# Patient Record
Sex: Female | Born: 1990 | Race: White | Hispanic: No | Marital: Single
Health system: Midwestern US, Community
[De-identification: ages and names within clinical notes are randomized; demographics above are authoritative.]

## PROBLEM LIST (undated history)

## (undated) ENCOUNTER — Inpatient Hospital Stay (HOSPITAL_COMMUNITY): Payer: Self-pay

## (undated) DIAGNOSIS — E109 Type 1 diabetes mellitus without complications: Secondary | ICD-10-CM

## (undated) DIAGNOSIS — O039 Complete or unspecified spontaneous abortion without complication: Secondary | ICD-10-CM

## (undated) DIAGNOSIS — Z30017 Encounter for initial prescription of implantable subdermal contraceptive: Secondary | ICD-10-CM

## (undated) DIAGNOSIS — S0500XA Injury of conjunctiva and corneal abrasion without foreign body, unspecified eye, initial encounter: Secondary | ICD-10-CM

## (undated) DIAGNOSIS — N390 Urinary tract infection, site not specified: Secondary | ICD-10-CM

## (undated) DIAGNOSIS — Z3490 Encounter for supervision of normal pregnancy, unspecified, unspecified trimester: Secondary | ICD-10-CM

## (undated) DIAGNOSIS — Z794 Long term (current) use of insulin: Secondary | ICD-10-CM

## (undated) DIAGNOSIS — IMO0001 Reserved for inherently not codable concepts without codable children: Secondary | ICD-10-CM

## (undated) DIAGNOSIS — O139 Gestational [pregnancy-induced] hypertension without significant proteinuria, unspecified trimester: Secondary | ICD-10-CM

## (undated) DIAGNOSIS — F319 Bipolar disorder, unspecified: Secondary | ICD-10-CM

## (undated) DIAGNOSIS — E119 Type 2 diabetes mellitus without complications: Secondary | ICD-10-CM

## (undated) HISTORY — DX: Type 2 diabetes mellitus without complications: E11.9

## (undated) HISTORY — DX: Type 1 diabetes mellitus without complications: E10.9

## (undated) HISTORY — DX: Complete or unspecified spontaneous abortion without complication: O03.9

## (undated) HISTORY — DX: Reserved for inherently not codable concepts without codable children: IMO0001

## (undated) HISTORY — DX: Injury of conjunctiva and corneal abrasion without foreign body, unspecified eye, initial encounter: S05.00XA

## (undated) HISTORY — DX: Encounter for initial prescription of implantable subdermal contraceptive: Z30.017

## (undated) HISTORY — DX: Long term (current) use of insulin: Z79.4

---
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
## 2012-01-18 LAB — URINE MICROSCOPIC ONLY
RBC: 36 /HPF (ref 0–5)
RBC: 4 /HPF (ref 0–5)
WBC: 4 /HPF (ref 0–4)
WBC: 4 /HPF (ref 0–4)

## 2012-01-18 LAB — URINALYSIS W/ RFLX MICROSCOPIC
Bilirubin: NEGATIVE
Bilirubin: NEGATIVE
Glucose: NEGATIVE MG/DL
Glucose: NEGATIVE MG/DL
Ketone: NEGATIVE MG/DL
Ketone: NEGATIVE MG/DL
Nitrites: NEGATIVE
Nitrites: NEGATIVE
Protein: 30 MG/DL — AB
Protein: NEGATIVE MG/DL
Specific gravity: 1.02 (ref 1.003–1.030)
Specific gravity: 1.01 (ref 1.003–1.030)
Urobilinogen: 0.2 EU/DL (ref 0.2–1.0)
Urobilinogen: 0.2 EU/DL (ref 0.2–1.0)
pH (UA): 7 (ref 5.0–8.0)
pH (UA): 7 (ref 5.0–8.0)

## 2012-01-18 LAB — WET PREP

## 2012-01-18 LAB — HCG URINE, QL: HCG urine, QL: NEGATIVE

## 2012-01-18 MED ORDER — ONDANSETRON 8 MG TAB, RAPID DISSOLVE
8 mg | ORAL | Status: AC
Start: 2012-01-18 — End: 2012-01-18
  Administered 2012-01-18: 21:00:00 via ORAL

## 2012-01-18 MED ORDER — IBUPROFEN 400 MG TAB
400 mg | ORAL_TABLET | Freq: Three times a day (TID) | ORAL | Status: DC | PRN
Start: 2012-01-18 — End: 2015-05-04

## 2012-01-18 MED ORDER — AZITHROMYCIN 250 MG TAB
250 mg | ORAL | Status: AC
Start: 2012-01-18 — End: 2012-01-18
  Administered 2012-01-18: 21:00:00 via ORAL

## 2012-01-18 MED ORDER — CEFTRIAXONE 250 MG SOLUTION FOR INJECTION
250 mg | Freq: Once | INTRAMUSCULAR | Status: AC
Start: 2012-01-18 — End: 2012-01-18
  Administered 2012-01-18: 21:00:00 via INTRAMUSCULAR

## 2012-01-18 MED ORDER — ONDANSETRON HCL 4 MG TAB
4 mg | ORAL_TABLET | Freq: Three times a day (TID) | ORAL | Status: DC | PRN
Start: 2012-01-18 — End: 2015-05-04

## 2012-01-18 MED ORDER — CIPROFLOXACIN 500 MG TAB
500 mg | ORAL_TABLET | Freq: Two times a day (BID) | ORAL | Status: AC
Start: 2012-01-18 — End: 2012-01-25

## 2012-01-18 MED ORDER — METRONIDAZOLE 500 MG TAB
500 mg | ORAL_TABLET | Freq: Two times a day (BID) | ORAL | Status: AC
Start: 2012-01-18 — End: 2012-01-25

## 2012-01-18 MED FILL — AZITHROMYCIN 250 MG TAB: 250 mg | ORAL | Qty: 4

## 2012-01-18 MED FILL — ONDANSETRON 8 MG TAB, RAPID DISSOLVE: 8 mg | ORAL | Qty: 1

## 2012-01-18 MED FILL — XYLOCAINE-MPF 10 MG/ML (1 %) INJECTION SOLUTION: 10 mg/mL (1 %) | INTRAMUSCULAR | Qty: 5

## 2012-01-18 MED FILL — CEFTRIAXONE 250 MG SOLUTION FOR INJECTION: 250 mg | INTRAMUSCULAR | Qty: 250

## 2012-01-18 NOTE — ED Provider Notes (Signed)
Patient is a 21 y.o. female presenting with abdominal pain, vomiting, and dysmenorrhea. The history is provided by the patient.   Abdominal Pain   Associated symptoms include vomiting.   Vomiting   Associated symptoms include abdominal pain.   Dysmenorrhea  Associated symptoms include abdominal pain and vomiting.    usually healthy 21 yo with PMH irregular menses, no past pregnancies or GYN infections, reports onset menses 2 days ago, now with cramps pelvic pain and heavier bleeding than typical for her.Does not normally have any pain with periods. Used 4 tampons today. Also reports dysuria. No discharge except bleeding. Using condoms most of the time. No recent partner change. Is on BCP, is usual time for menses. Today nausea and vomited twice. No fever, no bowel changes.     History reviewed. No pertinent past medical history.     History reviewed. No pertinent past surgical history.      No family history on file.     History     Social History   ??? Marital Status: SINGLE     Spouse Name: N/A     Number of Children: N/A   ??? Years of Education: N/A     Occupational History   ??? Not on file.     Social History Main Topics   ??? Smoking status: Never Smoker    ??? Smokeless tobacco: Not on file   ??? Alcohol Use: No   ??? Drug Use: No   ??? Sexually Active:      Other Topics Concern   ??? Not on file     Social History Narrative   ??? No narrative on file                  ALLERGIES: Aspirin      Review of Systems   Gastrointestinal: Positive for vomiting and abdominal pain.   Constitutional:  Denies malaise, fever, chills.   Head:  Denies injury.   Face:  Denies injury or pain.   ENMT:  Denies sore throat.   Neck:  Denies injury or pain.   Chest:  Denies injury.   Cardiac:  Denies chest pain or palpitations.   Respiratory:  Denies cough, wheezing, difficulty breathing, shortness of breath.   GI/ABD:  Denies injury,+ pelvic pain, -distention,+ nausea, vomiting, -diarrhea.   GU:  Denies injury, +pain, dysuria or urgency.   Back:   Denies injury or pain.   Pelvis:  Denies injury or pain.   Extremity/MS:  Denies injury or pain.   Neuro:  Denies headache, LOC, dizziness, neurologic symptoms/deficits/paresthesias.   Skin: Denies injury, rash, itching or skin changes.    Filed Vitals:    01/18/12 1515   BP: 140/82   Pulse: 88   Temp: 99 ??F (37.2 ??C)   Resp: 18   Height: 5\' 11"  (1.803 m)   Weight: 62.199 kg (137 lb 2 oz)   SpO2: 100%            Physical Exam   Constitutional : awake, alert, in no distress, well appearing  Head:  Normocephalic, atraumatic.  Eyes:  Pupils are equal, round and reactive to light.  The extraocular muscles are intact.  Eyelids, conjunctiva, iris, and sclera are normal.    Ears:  External ears are normal.  Nose:  The nose is normal in appearance.  There is no rhinorrhea.  Mouth/dental:  The lips, gums, and teeth appear normal.  There are no exudates or erosions on the buccal mucosa.  The  uvula is mid-line.  The tonsils are not inflamed or erythematous.  The posterior pharynx is free from erythema and exudates.    Neck:  The trachea is mid-line.  The neck is supple and non-tender to palpation.  There is no cervical lymphadenopathy.  There is no JVD.  Chest:  No evidence of trauma or deformity.  Non-tender to palpation.  No crepitus or paradoxical movements.  Chest excursion is normal.  Cardiovascular:  The heart has a regular rate and rhythm.  S1 and S2 are normal.  There are no murmurs, rubs, or gallops.  Pulses and pressures are equal bilaterally and there is brisk capillary refill.  Lungs:  Respiratory rate and effort are normal.  The lungs are clear to auscultation and percussion bilaterally.  No wheezes, rhonchi, or rales.  GI/Abdomen:  The abdomen is normal in appearance.  Bowel sounds are normal and are heard in all four quadrants.  There are no abdominal bruits.  There is no pain with palpation.  There is no evidence of spleen or liver enlargement.  No masses were palpated.    GU: normal female genitalia, scant  blood in vault, no CMT, + bilateral adnexal tenderness  Musculoskeletal:  There are no deformities noted in all four extremities.  There is full ROM with movement.    Integumentary:  The skin appears normal for age and race.  It is warm and dry.  There is no rash.  Neurological:  Alert and oriented to time, place, person, and events.  Speech is normal.  Cranial nerves I-XII grossly intact.    Psychological:  The patient's mood and manner are appropriate.  Grooming and personal hygiene are appropriate.  Alert and oriented to time, place, person and events.  MDM     Differential Diagnosis; Clinical Impression; Plan:     The patient presents with abdominal pain with a differential diagnosis of abdominal pain, appendicitis, ectopic pregnancy, gastroenteritis, PID, pregnancy, UTI, vaginal bleeding and vomiting    Voided urine contaminated by menses, will obtain cath spec. offered coverage for STDs pending cultures and she agrees to same. No excessive bleeding, do not feel blood work indicated today, negative hcg    Discussed results, advised call Sunday for results, gyn follow up, she feels better and tolerated po meds  Amount and/or Complexity of Data Reviewed:   Clinical lab tests:  Ordered and reviewed      Procedures

## 2012-01-18 NOTE — ED Notes (Signed)
Patient states onset of menses 2 days ago.  States onset of cramping last night.  States noticing stringy clots and bright red blood today.   C/o vomiting x 2 day.

## 2012-01-18 NOTE — ED Provider Notes (Signed)
I was personally available for consultation in the emergency department.  I have reviewed the chart and agree with the documentation recorded by the MLP, including the assessment, treatment plan, and disposition.  Lazlo Tunney B. Munachimso Palin, MD

## 2012-01-18 NOTE — ED Notes (Signed)
I have reviewed discharge instructions with the patient.  The patient verbalized understanding.  Patient armband removed and shredded

## 2012-01-18 NOTE — ED Notes (Signed)
Patient brought to room and given gown. Call bell is within reach. Updated with plan of care.

## 2012-01-18 NOTE — ED Notes (Signed)
Pt reports to ED NAD, reports pains in abdomen with her period. Pt report pain 8/10.

## 2012-01-19 ENCOUNTER — Encounter (HOSPITAL_COMMUNITY): Payer: Self-pay | Admitting: Psychiatry

## 2012-01-19 ENCOUNTER — Ambulatory Visit (INDEPENDENT_AMBULATORY_CARE_PROVIDER_SITE_OTHER): Payer: No Typology Code available for payment source | Admitting: Psychiatry

## 2012-01-19 VITALS — BP 111/73 | HR 86 | Ht 65.25 in | Wt 143.0 lb

## 2012-01-19 DIAGNOSIS — F3181 Bipolar II disorder: Secondary | ICD-10-CM

## 2012-01-19 DIAGNOSIS — F3189 Other bipolar disorder: Secondary | ICD-10-CM

## 2012-01-19 LAB — CHLAMYDIA/GC PCR
Chlamydia amplified: POSITIVE — AB
N. gonorrhea, amplified: NEGATIVE

## 2012-01-19 MED ORDER — ARIPIPRAZOLE 5 MG PO TABS: 5.0000 mg | ORAL_TABLET | Freq: Every day | ORAL | Status: DC
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
## 2012-01-20 ENCOUNTER — Encounter (HOSPITAL_COMMUNITY): Payer: Self-pay | Admitting: Psychiatry

## 2012-01-20 DIAGNOSIS — F3181 Bipolar II disorder: Secondary | ICD-10-CM | POA: Insufficient documentation

## 2012-01-20 LAB — CULTURE, URINE
Culture result:: >100000
Culture: >100000

## 2012-01-20 NOTE — ED Notes (Cosign Needed)
Called and spoke to pt.  After confirming identity,  informed of positive chlamydia results.  Pt was treated in ED so no further treatment necessary.  Pt instructed to inform partner(s) for treatment.  Pt thanked for call.

## 2012-01-21 ENCOUNTER — Inpatient Hospital Stay (HOSPITAL_COMMUNITY): Payer: No Typology Code available for payment source

## 2012-01-21 ENCOUNTER — Encounter (HOSPITAL_COMMUNITY): Payer: Self-pay

## 2012-01-21 ENCOUNTER — Inpatient Hospital Stay (HOSPITAL_COMMUNITY)
Admission: AD | Admit: 2012-01-21 | Discharge: 2012-01-21 | Disposition: A | Discharge status: Home / Self Care | Payer: No Typology Code available for payment source | Source: Ambulatory Visit | Attending: Obstetrics & Gynecology | Admitting: Obstetrics & Gynecology

## 2012-01-21 DIAGNOSIS — E109 Type 1 diabetes mellitus without complications: Secondary | ICD-10-CM | POA: Insufficient documentation

## 2012-01-21 DIAGNOSIS — O24919 Unspecified diabetes mellitus in pregnancy, unspecified trimester: Secondary | ICD-10-CM

## 2012-01-21 HISTORY — DX: Urinary tract infection, site not specified: N39.0

## 2012-01-21 LAB — BASIC METABOLIC PANEL
Chloride: 101 mEq/L (ref 96–112)
GFR calc Af Amer: >90 mL/min (ref >90)
Potassium: 3.3 mEq/L — ABNORMAL LOW (ref 3.5–5.1)
Sodium: 136 mEq/L (ref 135–145)

## 2012-01-21 LAB — POCT PREGNANCY, URINE: Preg Test, Ur: POSITIVE — AB

## 2012-01-21 LAB — GLUCOSE, CAPILLARY
Glucose-Capillary: 62 mg/dL — ABNORMAL LOW (ref 70–99)
Glucose-Capillary: 165 mg/dL — ABNORMAL HIGH (ref 70–99)

## 2012-01-21 MED ORDER — PRENAT VIT-FEPOLY-METHYLFOL-FA 29-1.13-0.4 MG PO TABS: 1.0000 | ORAL_TABLET | ORAL | Status: DC

## 2012-01-21 NOTE — MAU Note (Signed)
Patient states she has been diabetic since age 21. States her CBG's have elevated for about 4 days and today at 1200 was over 400. In triage CBG is 62. Having no other problems and no symptoms.

## 2012-01-21 NOTE — MAU Provider Note (Signed)
History     CSN: [id number]  Arrival date & time 01/21/12  1523      Chief Complaint  Patient presents with  . Hyperglycemia   HPI This 21 year old G2 P0010 @ 7weeks by LMP 12/03/2011 dx Type 1 DM at age 54. Presents stating her insulin pump read "high" meaning too high to record at 11 AM today and shortly after was in the 400's. She now feels shakey and FSCBG is 62. Admitted at [facility] 12/24/2011 for ketoacidosis with bicarb 10. Hgb A1C was 11.9, TSH .478. States she has only had 2 hospitalizations for ketoacidosis. Seen here in Jan 2013 with quant 2380 and nothing in uterus, received MTX and states quants were followed weekly at [facility] down to <5.    Past Medical History  Diagnosis Date  . 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 Corneal abrasion   . Candidiasis   . Tubal pregnancy   . Diabetes mellitus     dx age 45  . Diabetes mellitus type I   . Urinary tract infection     Past Surgical History  Procedure Date  . Wisdom tooth extraction 2012    Family History  Problem Relation Age of Onset  . Hypertension Mother   . Cancer Mother   . Anesthesia problems Neg Hx     History  Substance Use Topics  . Smoking status: Former Smoker -- 0.5 packs/day for 2 years    Types: Cigarettes  . Smokeless tobacco: Not on file   Comment: quit with + preg  . Alcohol Use: No    OB History    Grav Para Term Preterm Abortions TAB SAB Ect Mult Living   2    1 0 0 1 0 0      Review of Systems  Allergies  Review of patient's allergies indicates no known allergies.  Home Medications  No current outpatient prescriptions on file.  BP 116/69  Pulse 109  Temp(Src) 98.1 F (36.7 C) (Oral)  Resp 16  Ht 5\' 5"  (1.651 m)  Wt 62.869 kg (138 lb 9.6 oz)  BMI 23.06 kg/m2  SpO2 100%  LMP 12/03/2011  Physical Exam  ED Course  Procedures (including critical care time)  Labs Reviewed  GLUCOSE, CAPILLARY - Abnormal; Notable for the following:    Glucose-Capillary 62 (*)    All other components within normal limits  BASIC METABOLIC PANEL - Abnormal; Notable for the following:    Potassium 3.3 (*)    Creatinine, Ser 0.44 (*)    All other components within normal limits  POCT PREGNANCY, URINE - Abnormal; Notable for the following:    Preg Test, Ur POSITIVE (*)    All other components within normal limits   [place]: GS and YS seen, no embryo, [redacted]w[redacted]d by CRL  Exam deferred.   MAU course: Given a diabetic tray and shakiness resolved. Recheck CBG 165 on MAU meter, 150 on her meter  Assessment/Plan G2P0010 at [redacted]w[redacted]d Type 1 DM with poor glycemic control C/W Dr. Despina Hidden; Continue insulin regimen as directed; keep log book; strict CHO restricted diet; keep appt. at [facility] next week. Stressed POC with pt, partner and her mother.

## 2012-01-21 NOTE — MAU Note (Signed)
States is feeling much better, no longer feeling shaky.  Will wait for [place] until has eaten.

## 2012-01-21 NOTE — MAU Note (Signed)
Snack given, diet ordered.  Will wait on [place] for pt to stabilize.

## 2012-01-21 NOTE — MAU Note (Signed)
Finished eating, feeling good, has reattached insulin pump per CNM.  [place] paged.

## 2012-01-21 NOTE — Progress Notes (Signed)
Not my pt

## 2012-01-21 NOTE — MAU Note (Signed)
Blood sugars been running high (400's).  Around 1100- just said "high", increased correction dose and it started to come down, first reading was 400.  Called primary- not in office, called [facility]- told to come here.

## 2012-01-21 NOTE — Discharge Instructions (Signed)
Diabetes Meal Planning Guide The diabetes meal planning guide is a tool to help you plan your meals and snacks. It is important for people with diabetes to manage their blood glucose (sugar) levels. Choosing the right foods and the right amounts throughout your day will help control your blood glucose. Eating right can even help you improve your blood pressure and reach or maintain a healthy weight. CARBOHYDRATE COUNTING MADE EASY When you eat carbohydrates, they turn to sugar. This raises your blood glucose level. Counting carbohydrates can help you control this level so you feel better. When you plan your meals by counting carbohydrates, you can have more flexibility in what you eat and balance your medicine with your food intake. Carbohydrate counting simply means adding up the total amount of carbohydrate grams in your meals and snacks. Try to eat about the same amount at each meal. Foods with carbohydrates are listed below. Each portion below is 1 carbohydrate serving or 15 grams of carbohydrates. Ask your dietician how many grams of carbohydrates you should eat at each meal or snack. Grains and Starches  1 slice bread.    English muffin or hotdog/hamburger bun.    cup cold cereal (unsweetened).   ? cup cooked pasta or rice.    cup starchy vegetables (corn, potatoes, peas, beans, winter squash).   1 tortilla (6 inches).    bagel.   1 waffle or pancake (size of a CD).    cup cooked cereal.   4 to 6 small crackers.  *Whole grain is recommended. Fruit  1 cup fresh unsweetened berries, melon, papaya, pineapple.   1 small fresh fruit.    banana or mango.    cup fruit juice (4 oz unsweetened).    cup canned fruit in natural juice or water.   2 tbs dried fruit.   12 to 15 grapes or cherries.  Milk and Yogurt  1 cup fat-free or 1% milk.   1 cup soy milk.   6 oz light yogurt with sugar-free sweetener.   6 oz low-fat soy yogurt.   6 oz plain yogurt.   Vegetables  1 cup raw or  cup cooked is counted as 0 carbohydrates or a "free" food.   If you eat 3 or more servings at 1 meal, count them as 1 carbohydrate serving.  Other Carbohydrates   oz chips or pretzels.    cup ice cream or frozen yogurt.    cup sherbet or sorbet.   2 inch square cake, no frosting.   1 tbs honey, sugar, jam, jelly, or syrup.   2 small cookies.   3 squares of graham crackers.   3 cups popcorn.   6 crackers.   1 cup broth-based soup.   Count 1 cup casserole or other mixed foods as 2 carbohydrate servings.   Foods with less than 20 calories in a serving may be counted as 0 carbohydrates or a "free" food.  You may want to purchase a book or computer software that lists the carbohydrate gram counts of different foods. In addition, the nutrition facts panel on the labels of the foods you eat are a good source of this information. The label will tell you how big the serving size is and the total number of carbohydrate grams you will be eating per serving. Divide this number by 15 to obtain the number of carbohydrate servings in a portion. Remember, 1 carbohydrate serving equals 15 grams of carbohydrate. SERVING SIZES Measuring foods and serving sizes helps you   make sure you are getting the right amount of food. The list below tells how big or small some common serving sizes are.  1 oz.........4 stacked dice.   3 oz........Marland KitchenDeck of cards.   1 tsp.......Marland KitchenTip of little finger.   1 tbs......Marland KitchenMarland KitchenThumb.   2 tbs.......Marland KitchenGolf ball.    cup......Marland KitchenHalf of a fist.   1 cup.......Marland KitchenA fist.  SAMPLE DIABETES MEAL PLAN Below is a sample meal plan that includes foods from the grain and starches, dairy, vegetable, fruit, and meat groups. A dietician can individualize a meal plan to fit your calorie needs and tell you the number of servings needed from each food group. However, controlling the total amount of carbohydrates in your meal or snack is more important than  making sure you include all of the food groups at every meal. You may interchange carbohydrate containing foods (dairy, starches, and fruits). The meal plan below is an example of a 2000 calorie diet using carbohydrate counting. This meal plan has 17 carbohydrate servings. Breakfast  1 cup oatmeal (2 carb servings).    cup light yogurt (1 carb serving).   1 cup blueberries (1 carb serving).    cup almonds.  Snack  1 large apple (2 carb servings).   1 low-fat string cheese stick.  Lunch  Chicken breast salad.   1 cup spinach.    cup chopped tomatoes.   2 oz chicken breast, sliced.   2 tbs low-fat [place] dressing.   12 whole-wheat crackers (2 carb servings).   12 to 15 grapes (1 carb serving).   1 cup low-fat milk (1 carb serving).  Snack  1 cup carrots.    cup hummus (1 carb serving).  Dinner  3 oz broiled salmon.   1 cup brown rice (3 carb servings).  Snack  1  cups steamed broccoli (1 carb serving) drizzled with 1 tsp olive oil and lemon juice.   1 cup light pudding (2 carb servings).  DIABETES MEAL PLANNING WORKSHEET Your dietician can use this worksheet to help you decide how many servings of foods and what types of foods are right for you.  BREAKFAST Food Group and Servings / Carb Servings Grain/Starches __________________________________ Dairy __________________________________________ Vegetable ______________________________________ Fruit ___________________________________________ Meat __________________________________________ Fat ____________________________________________ LUNCH Food Group and Servings / Carb Servings Grain/Starches ___________________________________ Dairy ___________________________________________ Fruit ____________________________________________ Meat ___________________________________________ Fat _____________________________________________ Laural Golden Food Group and Servings / Carb Servings Grain/Starches  ___________________________________ Dairy ___________________________________________ Fruit ____________________________________________ Meat ___________________________________________ Fat _____________________________________________ SNACKS Food Group and Servings / Carb Servings Grain/Starches ___________________________________ Dairy ___________________________________________ Vegetable _______________________________________ Fruit ____________________________________________ Meat ___________________________________________ Fat _____________________________________________ DAILY TOTALS Starches _________________________ Vegetable ________________________ Fruit ____________________________ Dairy ____________________________ Meat ____________________________ Fat ______________________________ Document Released: 06/17/2005 Document Revised: 09/09/2011 Document Reviewed: 04/28/2009 [employer] Patient Information 2012 [place], [place].Blood Sugar Monitoring, Adult GLUCOSE METERS FOR SELF-MONITORING OF BLOOD GLUCOSE  It is important to be able to correctly measure your blood sugar (glucose). You can use a blood glucose monitor (a small battery-operated device) to check your glucose level at any time. This allows you and your caregiver to monitor your diabetes and to determine how well your treatment plan is working. The process of monitoring your blood glucose with a glucose meter is called self-monitoring of blood glucose (SMBG). When people with diabetes control their blood sugar, they have better health. To test for glucose with a typical glucose meter, place the disposable strip in the meter. Then place a small sample of blood on the "test strip." The test strip is coated with chemicals that combine with  glucose in blood. The meter measures how much glucose is present. The meter displays the glucose level as a number. Several new models can record and store a number of test results. Some  models can connect to personal computers to store test results or print them out.  Newer meters are often easier to use than older models. Some meters allow you to get blood from places other than your fingertip. Some new models have automatic timing, error codes, signals, or barcode readers to help with proper adjustment (calibration). Some meters have a large display screen or spoken instructions for people with visual impairments.  INSTRUCTIONS FOR USING GLUCOSE METERS  Wash your hands with soap and warm water, or clean the area with alcohol. Dry your hands completely.   Prick the side of your fingertip with a lancet (a sharp-pointed tool used by hand).   Hold the hand down and gently milk the finger until a small drop of blood appears. Catch the blood with the test strip.   Follow the instructions for inserting the test strip and using the SMBG meter. Most meters require the meter to be turned on and the test strip to be inserted before applying the blood sample.   Record the test result.   Read the instructions carefully for both the meter and the test strips that go with it. Meter instructions are found in the user manual. Keep this manual to help you solve any problems that may arise. Many meters use "error codes" when there is a problem with the meter, the test strip, or the blood sample on the strip. You will need the manual to understand these error codes and fix the problem.   New devices are available such as laser lancets and meters that can test blood taken from "alternative sites" of the body, other than fingertips. However, you should use standard fingertip testing if your glucose changes rapidly. Also, use standard testing if:   You have eaten, exercised, or taken insulin in the past 2 hours.   You think your glucose is low.   You tend to not feel symptoms of low blood glucose (hypoglycemia).   You are ill or under stress.   Clean the meter as directed by the manufacturer.     Test the meter for accuracy as directed by the manufacturer.   Take your meter with you to your caregiver's office. This way, you can test your glucose in front of your caregiver to make sure you are using the meter correctly. Your caregiver can also take a sample of blood to test using a routine lab method. If values on the glucose meter are close to the lab results, you and your caregiver will see that your meter is working well and you are using good technique. Your caregiver will advise you about what to do if the results do not match.  FREQUENCY OF TESTING  Your caregiver will tell you how often you should check your blood glucose. This will depend on your type of diabetes, your current level of diabetes control, and your types of medicines. The following are general guidelines, but your care plan may be different. Record all your readings and the time of day you took them for review with your caregiver.   Diabetes type 1.   When you are using insulin with good diabetic control (either multiple daily injections or via a pump), you should check your glucose 4 times a day.   If your diabetes is not well controlled,  you may need to monitor more frequently, including before meals and 2 hours after meals, at bedtime, and occasionally between 2 a.m. and 3 a.m.   You should always check your glucose before a dose of insulin or before changing the rate on your insulin pump.   Diabetes type 2.   Guidelines for SMBG in diabetes type 2 are not as well defined.   If you are on insulin, follow the guidelines above.   If you are on medicines, but not insulin, and your glucose is not well controlled, you should test at least twice daily.   If you are not on insulin, and your diabetes is controlled with medicines or diet alone, you should test at least once daily, usually before breakfast.   A weekly profile will help your caregiver advise you on your care plan. The week before your visit, check  your glucose before a meal and 2 hours after a meal at least daily. You may want to test before and after a different meal each day so you and your caregiver can tell how well controlled your blood sugars are throughout the course of a 24 hour period.   Gestational diabetes (diabetes during pregnancy).   Frequent testing is often necessary. Accurate timing is important.   If you are not on insulin, check your glucose 4 times a day. Check it before breakfast and 1 hour after the start of each meal.   If you are on insulin, check your glucose 6 times a day. Check it before each meal and 1 hour after the first bite of each meal.   General guidelines.   More frequent testing is required at the start of insulin treatment. Your caregiver will instruct you.   Test your glucose any time you suspect you have low blood sugar (hypoglycemia).   You should test more often when you change medicines, when you have unusual stress or illness, or in other unusual circumstances.  OTHER THINGS TO KNOW ABOUT GLUCOSE METERS  Measurement Range. Most glucose meters are able to read glucose levels over a broad range of values from as low as 0 to as high as 600 mg/dL. If you get an extremely high or low reading from your meter, you should first confirm it with another reading. Report very high or very low readings to your caregiver.   Whole Blood Glucose versus Plasma Glucose. Some older home glucose meters measure glucose in your whole blood. In a lab or when using some newer home glucose meters, the glucose is measured in your plasma (one component of blood). The difference can be important. It is important for you and your caregiver to know whether your meter gives its results as "whole blood equivalent" or "plasma equivalent."   Display of High and Low Glucose Values. Part of learning how to operate a meter is understanding what the meter results mean. Know how high and low glucose concentrations are displayed on  your meter.   Factors that Affect Glucose Meter Performance. The accuracy of your test results depends on many factors and varies depending on the brand and type of meter. These factors include:   Low red blood cell count (anemia).   Substances in your blood (such as uric acid, vitamin C, and others).   Environmental factors (temperature, humidity, altitude).   Name-brand versus generic test strips.   Calibration. Make sure your meter is set up properly. It is a good idea to do a calibration test with a control solution  recommended by the manufacturer of your meter whenever you begin using a fresh bottle of test strips. This will help verify the accuracy of your meter.   Improperly stored, expired, or defective test strips. Keep your strips in a dry place with the lid on.   Soiled meter.   Inadequate blood sample.  NEW TECHNOLOGIES FOR GLUCOSE TESTING Alternative site testing Some glucose meters allow testing blood from alternative sites. These include the:  Upper arm.   Forearm.   Base of the thumb.   Thigh.  Sampling blood from alternative sites may be desirable. However, it may have some limitations. Blood in the fingertips show changes in glucose levels more quickly than blood in other parts of the body. This means that alternative site test results may be different from fingertip test results, not because of the meter's ability to test accurately, but because the actual glucose concentration can be different.  Continuous Glucose Monitoring Devices to measure your blood glucose continuously are available, and others are in development. These methods can be more expensive than self-monitoring with a glucose meter. However, it is uncertain how effective and reliable these devices are. Your caregiver will advise you if this approach makes sense for you. IF BLOOD SUGARS ARE CONTROLLED, PEOPLE WITH DIABETES REMAIN HEALTHIER.  SMBG is an important part of the treatment plan of  patients with diabetes mellitus. Below are reasons for using SMBG:   It confirms that your glucose is at a specific, healthy level.   It detects hypoglycemia and severe hyperglycemia.   It allows you and your caregiver to make adjustments in response to changes in lifestyle for individuals requiring medicine.   It determines the need for starting insulin therapy in temporary diabetes that happens during pregnancy (gestational diabetes).  Document Released: 09/23/2003 Document Revised: 09/09/2011 Document Reviewed: 01/14/2011 [facility] Patient Information 2012 [place], [place].

## 2012-01-26 ENCOUNTER — Other Ambulatory Visit: Payer: Self-pay | Admitting: Obstetrics and Gynecology

## 2012-01-26 ENCOUNTER — Encounter: Payer: Self-pay | Admitting: Obstetrics and Gynecology

## 2012-01-26 ENCOUNTER — Observation Stay (HOSPITAL_COMMUNITY)
Admission: AD | Admit: 2012-01-26 | Discharge: 2012-01-27 | Disposition: A | Discharge status: Home / Self Care | Payer: Medicaid Other | Source: Ambulatory Visit | Attending: Obstetrics & Gynecology | Admitting: Obstetrics & Gynecology

## 2012-01-26 ENCOUNTER — Inpatient Hospital Stay (HOSPITAL_COMMUNITY): Payer: Medicaid Other

## 2012-01-26 DIAGNOSIS — F319 Bipolar disorder, unspecified: Secondary | ICD-10-CM | POA: Insufficient documentation

## 2012-01-26 DIAGNOSIS — E109 Type 1 diabetes mellitus without complications: Secondary | ICD-10-CM | POA: Insufficient documentation

## 2012-01-26 DIAGNOSIS — O99340 Other mental disorders complicating pregnancy, unspecified trimester: Secondary | ICD-10-CM | POA: Insufficient documentation

## 2012-01-26 DIAGNOSIS — E1065 Type 1 diabetes mellitus with hyperglycemia: Secondary | ICD-10-CM

## 2012-01-26 DIAGNOSIS — IMO0002 Reserved for concepts with insufficient information to code with codable children: Secondary | ICD-10-CM | POA: Diagnosis present

## 2012-01-26 DIAGNOSIS — O24919 Unspecified diabetes mellitus in pregnancy, unspecified trimester: Principal | ICD-10-CM | POA: Insufficient documentation

## 2012-01-26 LAB — BASIC METABOLIC PANEL
BUN: 11 mg/dL (ref 6–23)
CO2: 24 mEq/L (ref 19–32)
Chloride: 101 mEq/L (ref 96–112)
Creatinine, Ser: 0.47 mg/dL — ABNORMAL LOW (ref 0.50–1.10)
Glucose, Bld: 113 mg/dL — ABNORMAL HIGH (ref 70–99)

## 2012-01-26 LAB — DIFFERENTIAL
Basophils Absolute: 0 10*3/uL (ref 0.0–0.1)
Basophils Relative: 0 % (ref 0–1)
Eosinophils Absolute: 0.2 10*3/uL (ref 0.0–0.7)
Eosinophils Relative: 2 % (ref 0–5)
Lymphocytes Relative: 30 % (ref 12–46)

## 2012-01-26 LAB — CBC
MCHC: 35.3 g/dL (ref 30.0–36.0)
MCV: 93.4 fL (ref 78.0–100.0)
Platelets: 266 10*3/uL (ref 150–400)
RDW: 12.1 % (ref 11.5–15.5)
WBC: 8.9 10*3/uL (ref 4.0–10.5)

## 2012-01-26 LAB — GLUCOSE, CAPILLARY: Glucose-Capillary: 181 mg/dL — ABNORMAL HIGH (ref 70–99)

## 2012-01-26 MED ORDER — DEXTROSE-NACL 5-0.45 % IV SOLN: INTRAVENOUS | Status: DC

## 2012-01-26 MED ORDER — SODIUM CHLORIDE 0.9 % IV SOLN
INTRAVENOUS | Status: DC
  Filled 2012-01-26: qty 1

## 2012-01-26 MED ORDER — INSULIN REGULAR BOLUS VIA INFUSION
0.0000 [IU] | Freq: Three times a day (TID) | INTRAVENOUS | Status: DC
  Filled 2012-01-26: qty 10

## 2012-01-26 MED ORDER — DEXTROSE 50 % IV SOLN: 25.0000 mL | INTRAVENOUS | Status: DC | PRN

## 2012-01-26 MED ORDER — ACETONE (URINE) TEST VI STRP: 1.0000 | ORAL_STRIP | Status: DC | PRN

## 2012-01-26 MED ORDER — SODIUM CHLORIDE 0.45 % IV SOLN: Freq: Once | INTRAVENOUS | Status: DC

## 2012-01-26 NOTE — H&P (Signed)
[name] is an 21 y.o. female. She has been recently diagnosed as being pregnant, and [facility] an intrauterine gestational sac and yolk sac was too early to see the fetal pole. [facility] for her initial prenatal evaluation and blood sugar this morning at home was over 400. When tested here in the office the reading was too high to be recorded 100 urine glucose is 5+ but there is no urine ketone patient allegedly is tolerating diet 7 no vomiting and is essentially stable. She is admitted for diabetic stabilization. She'll be initiated on the glucose stabilizer on [facility], ultrasound followup is ordered, and diabetic educator will be contacted. Per prior to pregnancy she was followed by Dr.Nida in referral, using an Omni pod insulin pump on 1.1 units per hour with 1 unit of NovoLog per 7 g carbohydrate. Patient understanding of her diabetic regulation is not optimal and she describes her blood sugars as being as low as 69, and over 450  over the past 2 days. She's had no nausea or vomiting she has not felt ill. This is a desired pregnancy the baby's father is supportive. Hemoglobin A1c in Dr. Bonnye Fava 10.5 on 01/09/2012 .0  Pertinent Gynecological History: Menses: LMP 12/03/2011 Bleeding: None Contraception: none DES exposure: unknown Blood transfusions: none Sexually transmitted diseases: no past history Previous GYN Procedures: Methotrexate therapy for ectopic pregnancy 2013 January  Last mammogram: Not applicable Date:  Last pap: Never obtained Date:  OB History: G2 P0010  Menstrual History: Menarche age:  Patient's last menstrual period was 12/03/2011.    Past Medical History  Diagnosis Date  . 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 Corneal abrasion   . Candidiasis   . Tubal pregnancy   . Diabetes mellitus     dx age 50  . Diabetes mellitus type I   . Urinary tract  infection     Past Surgical History  Procedure Date  . Wisdom tooth extraction 2012    Family History  Problem Relation Age of Onset  . Hypertension Mother   . Cancer Mother   . Anesthesia problems Neg Hx     Social History:  reports that she has quit smoking. Her smoking use included Cigarettes. She has a 1 pack-year smoking history. She does not have any smokeless tobacco history on file. She reports that she does not drink alcohol or use illicit drugs.  Allergies: No Known Allergies   (Not in a hospital admission)  ROS mild nausea associated with early pregnancy no bleeding, no abdominal pain fever chills  Last menstrual period 12/03/2011. Physical Exam general exam shows a healthy-appearing Caucasian female in no acute distress weight 143 pounds blood pressure 94/42 pulse 82 through 5". No proteinuria and no ketonuria pupils a round reactive neck supple cardiovascular exam unremarkable abdomen nontender pelvic exam not done at this time  Obstetric ultrasound less than 14 weeks done last Friday showed an intrauterine gestational sac with yolk sac present but no fetal pole yet identifiable been performed last Friday this will be repeated  No results found for this or any previous visit (from the past 24 hour(s)).  No results found.  Assessment/Plan: Intrauterine pregnancy [redacted] weeks gestation diabetes mellitus type 1 poor control ruling out ketoacidosis Admitted for glucose stabilizer will obtain obstetrical ultrasound and diabetic cnsult and assistance.  Madden Garron V 01/26/2012, 2:53 PM

## 2012-01-26 NOTE — H&P (Signed)
OB Womens H&P CC: Hyperglycemia, pregnancy, DM1 poorly controlled.  Subjective:  [name] is a 21 y.o. G2 P19 female with EDC 09/21/12 at 5 and 6/[redacted] weeks gestation who is being admitted for observation and close blood sugar control. She was found to be hyperglycemic at her OBgyn office today >500. She has an insulin pump with 30 day averages around 214. She does however have multiple episodes of blood sugar greater than 500 and sometimes less than 80.  Lab Results  Component Value Date   HGBA1C 11.9* 12/24/2011     Her current obstetrical history is significant for DM1 poorly controlled.  Patient reports nausea.   Fetal Movement: too early.     Objective:   Vital signs in last 24 hours: Temp:  [98.1 F (36.7 C)] 98.1 F (36.7 C) (04/24 1515) Pulse Rate:  [97] 97  (04/24 1515) Resp:  [20] 20  (04/24 1515) BP: (123)/(75) 123/75 mmHg (04/24 1515) SpO2:  [99 %] 99 % (04/24 1515) Weight:  [61.961 kg (136 lb 9.6 oz)] 61.961 kg (136 lb 9.6 oz) (04/24 1541)   General:   alert, cooperative and no distress  Skin:   normal  HEENT:  PERRLA  Lungs:   clear to auscultation bilaterally  Heart:   regular rate and rhythm, S1, S2 normal, no murmur, click, rub or gallop  Breasts:   deferred  Abdomen:  soft, non-tender; bowel sounds normal; no masses,  no organomegaly  Pelvis:  Exam deferred.                                    Lab Results  Component Value Date   HGBA1C 11.9* 12/24/2011   HGBA1C 11.1* 05/24/2011   Lab Results  Component Value Date   LDLCALC UNABLE TO CALCULATE IF TRIGLYCERIDE OVER 400 mg/dL 1/61/0960   CREATININE 0.44* 01/21/2012   CBG (last 3)   Basename 01/26/12 1601  GLUCAP 118*    Lab Results  Component Value Date   WBC 8.9 01/26/2012   HGB 13.9 01/26/2012   HCT 39.4 01/26/2012   MCV 93.4 01/26/2012   PLT 266 01/26/2012    Assessment/Plan:  5 and 6/[redacted] weeks gestation. Here with hyperglycemia and worry for DKA/HHS  1. DM1 brittle (poorly  controlled)  Currently has normal Blood sugar after normal correction dose of fast acting insulin from her pump. She is not dehydrated and has no polyuria/polydipsia, no ketotic smell. We will keep her for observation and diabetic teaching. Likely discharge in the morning pending CBG monitoring and clinical state. We will obtain an ultrasound to confirm viable IUP. Urine ketone BMP - check potassium level. She will need close follow up with Dr. Myrtie Hawk her endocrinologist.   2. Bipolar disorder New diagnosis. From her history it sounds manic mainly without depression. She is currently not displaying signs of mania or depression and is not suicidal or homicidal She is not on any medications at this time  3. Diet: carb-modified  4. Proph: ambulation for dvt prophylaxis.  5. Dispo: Pending clinical improvement.   Edd Arbour, MD 4:38 PM 01/26/2012

## 2012-01-27 LAB — GLUCOSE, CAPILLARY: Glucose-Capillary: 206 mg/dL — ABNORMAL HIGH (ref 70–99)

## 2012-01-27 LAB — HEPATITIS B SURFACE ANTIGEN: Hepatitis B Surface Ag: NEGATIVE

## 2012-01-27 NOTE — Consult Note (Signed)
ADA Standards of Care 2012:  Dabetes in Pregnancy Target Ranges: Fasting - 60-90 mg/dL  Preprandial- 16-109 mg/dL Postprandial < 604 mg/dL (7.8 mmol/L) 5-40-98 Consult received regarding patient at [redacted] weeks gestation with elevated glucose yesterday who actually was able to lower her glucose to normal values with her insulin pump. Dr. Emelda Fear had called regarding starting an IV insulin drip per glucostabilizer, and I advised to bolus her with fluids per protocol before starting the insulin drip.  Pt on insulin drip, and I am not comfortable recommending changes with her pump.  I would advise she go to her primary MD for those changes needed.  Being in her first trimester, typically she may need less insulin than she has prior to pregnancy.  Will order an RD consult for nutrition plan in pregnancy.Glad to revisit if I can be of further help. Thank you, Lenor Coffin, RN, CNS, Diabetes Coordinator [phone])

## 2012-01-27 NOTE — Progress Notes (Signed)
Called Lenor Coffin concerning diabetes education for pt. Instructed to call dietician for consult. Dr. Macon Large notified. Orders given. Dietician notified. Will see pt on floor.

## 2012-01-27 NOTE — Discharge Summary (Signed)
Physician Discharge Summary  Patient ID: [name] MRN: [id number] DOB/AGE: June 24, 1991 21 y.o.  Admit date: 01/26/2012 Discharge date: 01/27/2012  Admission Diagnoses:  Discharge Diagnoses:  Active Problems:  DM (diabetes mellitus), type 1, uncontrolled   Discharged Condition: good  Hospital Course: She was admitted yesterday to blood sugars that were unable to be measured in the office, suspected greater than 500, with urine showing 5+. But no ketones in the urine.  by The time of arrival, blood sugars were much more reasonable, less than 200 diabetic consultation with diabetic educator was performed. She is referred back to Dr. Fransico Him, endocrinologist in [place] for followup care.  Consults: Diabetic educator, Nolen Mu  Significant Diagnostic Studies: labs:  CBG (last 3)   Basename 01/27/12 1138 01/27/12 0712 01/27/12 0324  GLUCAP 175* 118* 215*     Treatments: diabetic regulation  Discharge Exam: Blood pressure 110/60, pulse 100, temperature 98.7 F (37.1 C), temperature source Oral, resp. rate 20, height 5\' 5"  (1.651 m), weight 61.961 kg (136 lb 9.6 oz), last menstrual period 12/03/2011, SpO2 99.00%. Patient is generally alert ambulatory tolerating diet well and appears willing to improve appliance with Dr. Isidoro Donning advice  Disposition: 01-Home or Self Care   Medication List  As of 01/27/2012  2:54 PM   TAKE these medications         insulin pump 100 unit/ml Soln   Inject 1 each into the skin continuous. Patient gets 120 units daily with pump.Burnis Medin Vit-FePoly-Methylfol-FA 29-1.13-0.4 MG Tabs   Take 1 tablet by mouth 1 day or 1 dose.           Follow-up Information    Call Tilda Burrow, MD. (or earlier As needed if symptoms worsen)    Contact information:   [facility] [address], [address] [place] [address] [phone]          Signed: Tilda Burrow 01/27/2012, 2:54 PM

## 2012-01-27 NOTE — Progress Notes (Signed)
Reviewed with pt the handouts on GDM printed from [facility]. Pt instructed to watch diabetic video ([phone]) on the patient education network. Pt verbalized understanding.

## 2012-01-27 NOTE — H&P (Signed)
Attestation of Attending Supervision of Resident: Evaluation and management procedures were performed by the [facility] Resident under my supervision.  I have seen and examined the patient, reviewed the resident's note and chart, and I agree with management and plan.   Jaynie Collins, M.D. 01/27/2012 9:35 AM

## 2012-01-27 NOTE — Progress Notes (Signed)
Pt d/c home, ambulatory to private car. D/C instructions and  follow-up appointments with endocrinologist and Ob/Gyn reviewed with pt. Pt states" I will call and make my appointments tomorrow. "

## 2012-01-27 NOTE — Progress Notes (Signed)
UR Chart review completed.  

## 2012-01-27 NOTE — Progress Notes (Signed)
  Nutrition Dx: Food and nutrition-related knowledge deficit r/t no previous education aeb newly diagnosed GDM.    Nutrition education consult for Carbohydrate Modified Gestational Diabetic Diet completed.  "Meal  plan for gestational diabetics" handout given to patient.  Basic concepts reviewed.  Questions answered.  Patient verbalizes understanding.  Pt had good comprehension of Carb counting PTA. Has app that helps her count carbs. Demonstrated use to me. I oriented her to typical meal plan followed when pregnant, and carb count at each meal and snack. Pt oriented very quickly to handout on meal plan for Gestational Diabetes.  Understands that her glucose values will be monitored closely outpatient.

## 2012-01-27 NOTE — Progress Notes (Signed)
Patient's blood sugars far better than anticipated , though variable from 59 to 220's over the night.  Patient did NOT program her CBG's in to her pump, though she knows how, so no adjustments made by pump overnight.  Pt agrees to program results in going forward.  Pt expresses understanding of carb counting, and states that since Dx of pregnancy, she has improved her measuring of food volumes. She denies fever chills malaise, ; she has some mild nausea as expected with early pregnancy.  Ultrasound:gest sac confirmed with yolk sac and sac size felt to be growing appropriately.  the fetal pole is noted, less than 3 mm, so absence of documentable fetal heart motion not diagnostic of missed Ab.  Will follow up next week with u/s at office.  Temp:  [98.1 F (36.7 C)-99 F (37.2 C)] 98.6 F (37 C) (04/25 0544) Pulse Rate:  [86-100] 100  (04/25 0544) Resp:  [19-20] 19  (04/25 0544) BP: (102-123)/(67-75) 102/69 mmHg (04/25 0544) SpO2:  [97 %-99 %] 99 % (04/25 0544) Weight:  [61.689 kg (136 lb)-61.961 kg (136 lb 9.6 oz)] 61.961 kg (136 lb 9.6 oz) (04/24 1541)  Plan:  Diabetic counsel this morning, and discharge home this PM.

## 2012-02-03 LAB — OB RESULTS CONSOLE GC/CHLAMYDIA: Gonorrhea: NEGATIVE

## 2012-02-03 LAB — OB RESULTS CONSOLE ANTIBODY SCREEN: Antibody Screen: NEGATIVE

## 2012-02-03 LAB — OB RESULTS CONSOLE HIV ANTIBODY (ROUTINE TESTING): HIV: NONREACTIVE

## 2012-02-16 ENCOUNTER — Ambulatory Visit (HOSPITAL_COMMUNITY): Payer: No Typology Code available for payment source | Admitting: Psychiatry

## 2012-02-23 ENCOUNTER — Ambulatory Visit (HOSPITAL_COMMUNITY): Payer: No Typology Code available for payment source | Admitting: Psychiatry

## 2012-04-30 ENCOUNTER — Encounter (HOSPITAL_COMMUNITY): Payer: Self-pay | Admitting: *Deleted

## 2012-04-30 ENCOUNTER — Emergency Department (HOSPITAL_COMMUNITY)
Admission: EM | Admit: 2012-04-30 | Discharge: 2012-04-30 | Disposition: A | Discharge status: Home / Self Care | Payer: PRIVATE HEALTH INSURANCE | Attending: Emergency Medicine | Admitting: Emergency Medicine

## 2012-04-30 ENCOUNTER — Inpatient Hospital Stay (HOSPITAL_COMMUNITY)
Admission: AD | Admit: 2012-04-30 | Discharge: 2012-04-30 | Disposition: A | Discharge status: Other Acute Inpatient Hospital | Payer: PRIVATE HEALTH INSURANCE | Source: Ambulatory Visit | Attending: Obstetrics & Gynecology | Admitting: Obstetrics & Gynecology

## 2012-04-30 ENCOUNTER — Emergency Department (HOSPITAL_COMMUNITY): Payer: PRIVATE HEALTH INSURANCE

## 2012-04-30 DIAGNOSIS — R51 Headache: Secondary | ICD-10-CM | POA: Insufficient documentation

## 2012-04-30 DIAGNOSIS — Z87891 Personal history of nicotine dependence: Secondary | ICD-10-CM | POA: Insufficient documentation

## 2012-04-30 DIAGNOSIS — O2690 Pregnancy related conditions, unspecified, unspecified trimester: Secondary | ICD-10-CM | POA: Insufficient documentation

## 2012-04-30 DIAGNOSIS — Z794 Long term (current) use of insulin: Secondary | ICD-10-CM | POA: Insufficient documentation

## 2012-04-30 DIAGNOSIS — O24919 Unspecified diabetes mellitus in pregnancy, unspecified trimester: Secondary | ICD-10-CM | POA: Insufficient documentation

## 2012-04-30 DIAGNOSIS — E109 Type 1 diabetes mellitus without complications: Secondary | ICD-10-CM | POA: Insufficient documentation

## 2012-04-30 DIAGNOSIS — O99891 Other specified diseases and conditions complicating pregnancy: Secondary | ICD-10-CM | POA: Insufficient documentation

## 2012-04-30 DIAGNOSIS — O210 Mild hyperemesis gravidarum: Secondary | ICD-10-CM | POA: Insufficient documentation

## 2012-04-30 LAB — COMPREHENSIVE METABOLIC PANEL
ALT: 6 U/L (ref 0–35)
Albumin: 3.1 g/dL — ABNORMAL LOW (ref 3.5–5.2)
Alkaline Phosphatase: 50 U/L (ref 39–117)
Calcium: 8.8 mg/dL (ref 8.4–10.5)
Potassium: 3.1 mEq/L — ABNORMAL LOW (ref 3.5–5.1)
Sodium: 140 mEq/L (ref 135–145)
Total Protein: 6.1 g/dL (ref 6.0–8.3)

## 2012-04-30 LAB — URINALYSIS, ROUTINE W REFLEX MICROSCOPIC
Hgb urine dipstick: NEGATIVE
Ketones, ur: >80 mg/dL — AB
Leukocytes, UA: NEGATIVE
Leukocytes, UA: NEGATIVE
Nitrite: NEGATIVE
Nitrite: NEGATIVE
Protein, ur: NEGATIVE mg/dL
Specific Gravity, Urine: >1.03 — ABNORMAL HIGH (ref 1.005–1.030)
Urobilinogen, UA: 0.2 mg/dL (ref 0.0–1.0)

## 2012-04-30 LAB — POCT I-STAT, CHEM 8
Calcium, Ion: 1.08 mmol/L — ABNORMAL LOW (ref 1.12–1.23)
HCT: 34 % — ABNORMAL LOW (ref 36.0–46.0)
TCO2: 18 mmol/L (ref 0–100)

## 2012-04-30 LAB — CBC
MCHC: 34.1 g/dL (ref 30.0–36.0)
Platelets: 217 10*3/uL (ref 150–400)
RDW: 12.4 % (ref 11.5–15.5)

## 2012-04-30 LAB — GLUCOSE, CAPILLARY: Glucose-Capillary: 169 mg/dL — ABNORMAL HIGH (ref 70–99)

## 2012-04-30 LAB — URINE MICROSCOPIC-ADD ON

## 2012-04-30 MED ORDER — HYDROMORPHONE HCL PF 1 MG/ML IJ SOLN
1.0000 mg | Freq: Once | INTRAMUSCULAR | Status: AC
  Administered 2012-04-30: 1 mg via INTRAVENOUS
  Filled 2012-04-30: qty 1

## 2012-04-30 MED ORDER — SODIUM CHLORIDE 0.9 % IV SOLN: INTRAVENOUS | Status: DC

## 2012-04-30 MED ORDER — SODIUM CHLORIDE 0.9 % IV BOLUS (SEPSIS)
1000.0000 mL | Freq: Once | INTRAVENOUS | Status: AC
  Administered 2012-04-30: 1000 mL via INTRAVENOUS

## 2012-04-30 MED ORDER — DIPHENHYDRAMINE HCL 50 MG/ML IJ SOLN
25.0000 mg | Freq: Once | INTRAMUSCULAR | Status: AC
  Administered 2012-04-30: 25 mg via INTRAVENOUS
  Filled 2012-04-30: qty 1

## 2012-04-30 MED ORDER — ONDANSETRON HCL 4 MG/2ML IJ SOLN
4.0000 mg | Freq: Once | INTRAMUSCULAR | Status: AC
  Administered 2012-04-30: 4 mg via INTRAVENOUS
  Filled 2012-04-30: qty 2

## 2012-04-30 MED ORDER — SODIUM CHLORIDE 0.9 % IV SOLN
INTRAVENOUS | Status: DC
  Administered 2012-04-30: 13:00:00 via INTRAVENOUS

## 2012-04-30 MED ORDER — DEXAMETHASONE SODIUM PHOSPHATE 10 MG/ML IJ SOLN: 10.0000 mg | Freq: Once | INTRAMUSCULAR | Status: DC

## 2012-04-30 MED ORDER — METOCLOPRAMIDE HCL 5 MG/ML IJ SOLN
10.0000 mg | Freq: Once | INTRAMUSCULAR | Status: AC
  Administered 2012-04-30: 10 mg via INTRAVENOUS
  Filled 2012-04-30: qty 2

## 2012-04-30 NOTE — ED Notes (Signed)
Pt reports she is [redacted] weeks pregnant

## 2012-04-30 NOTE — ED Notes (Signed)
MWN:[id number]<ZD> Expected date:04/30/12<BR> Expected time: 4:15 PM<BR> Means of arrival:Ambulance<BR> Comments:<BR> headache

## 2012-04-30 NOTE — MAU Note (Addendum)
Pt reports having nausea and vomiting and headache  that started this morning. Pt is type 1 diabetic with insulin  Pump. Has not had n/v since first few weeks of pregnancy

## 2012-04-30 NOTE — MAU Provider Note (Signed)
  History     CSN: [id number]  Arrival date and time: 04/30/12 1220   None     Chief Complaint  Patient presents with  . Emesis During Pregnancy   HPI  Pt reports having nausea and vomiting and headache that started this morning. Pt is type 1 diabetic with insulin Pump. Has not had n/v since first few weeks of pregnancy.  Reports severe headache that began in frontal area last night.  Pain is described as increased pressure and pain throughout.  +photophobia, no vision changes.  +vomiting started this morning. Pt reports that this is the worst headache she's ever had.      Past Medical History  Diagnosis Date  . 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 Corneal abrasion   . Candidiasis   . Tubal pregnancy   . Diabetes mellitus     dx age 42  . Diabetes mellitus type I   . Urinary tract infection     Past Surgical History  Procedure Date  . Wisdom tooth extraction 2012    Family History  Problem Relation Age of Onset  . Hypertension Mother   . Cancer Mother   . Anesthesia problems Neg Hx     History  Substance Use Topics  . Smoking status: Former Smoker -- 0.5 packs/day for 2 years    Types: Cigarettes  . Smokeless tobacco: Not on file   Comment: quit with + preg  . Alcohol Use: No    Allergies: No Known Allergies  Prescriptions prior to admission  Medication Sig Dispense Refill  . Insulin Human (INSULIN PUMP) 100 unit/ml SOLN Inject 1 each into the skin continuous. Patient gets 120 units daily with pump..      . Prenat Vit-FePoly-Methylfol-FA 29-1.13-0.4 MG TABS Take 1 tablet by mouth 1 day or 1 dose.  30 tablet  5    Review of Systems  Constitutional: Negative.   Eyes: Negative.   Respiratory: Negative.   Cardiovascular: Negative.   Gastrointestinal: Positive for nausea and vomiting. Negative for abdominal pain, diarrhea and constipation.  Genitourinary: Negative.   Musculoskeletal: Negative.   Neurological: Positive for headaches.    Physical Exam   Blood pressure 120/72, pulse 140, temperature 98.5 F (36.9 C), temperature source Oral, resp. rate 18, height 5\' 5"  (1.651 m), weight 62.415 kg (137 lb 9.6 oz), last menstrual period 12/03/2011, unknown if currently breastfeeding.  Physical Exam  Constitutional: She is oriented to person, place, and time. She appears well-developed and well-nourished. No distress.  HENT:  Head: Normocephalic.  Eyes: EOM are normal. Pupils are equal, round, and reactive to light.  Neck: Normal range of motion. Neck supple.  Cardiovascular: Normal rate, regular rhythm and normal heart sounds.   Respiratory: Effort normal and breath sounds normal.  GI: Soft. There is no tenderness.  Genitourinary: No bleeding around the vagina.  Musculoskeletal: Normal range of motion. She exhibits no edema.  Neurological: She is alert and oriented to person, place, and time. She has normal reflexes. She displays normal reflexes.  Skin: Skin is warm and dry.    MAU Course  Procedures  NS IV Fluids IV Dilaudid 1 mg > minimal relief Consult with Dr. Marice Potter > transfer patient for further evaluation of headache.    Assessment and Plan  Headache  Plan: Transfer to [facility] Dr. Marice Potter called report to Dr. Windell Hummingbird   [facility] 04/30/2012, 1:18 PM

## 2012-04-30 NOTE — ED Notes (Signed)
Pt transferred from [facility]. Reports having n/v and severe headache in frontal area that began last night.  Hx of type 1 diabetes with insulin pump. Hx of tubal pregnancy

## 2012-04-30 NOTE — ED Provider Notes (Signed)
History     CSN: [id number]  Arrival date & time 04/30/12  1623   First MD Initiated Contact with Patient 04/30/12 1701      Chief Complaint  Patient presents with  . Headache    (Consider location/radiation/quality/duration/timing/severity/associated sxs/prior treatment) The history is provided by the patient.   Patient here with headache that started last night located in her frontal area that has been associated with vomiting x1. No fever or photophobia. No recent rashes or tick bites. Headache is also worse with sound. Family history of migraines. Went to [facility] hospital prior to arrival was given Dilaudid with transient relief. Denies any recent history of head trauma. She is currently [redacted] weeks pregnant and had a normal pregnancy. Denies any focal weakness of her arms or legs. Denies any ataxia. Past Medical History  Diagnosis Date  . 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 Corneal abrasion   . Candidiasis   . Tubal pregnancy   . Diabetes mellitus     dx age 19  . Diabetes mellitus type I   . Urinary tract infection     Past Surgical History  Procedure Date  . Wisdom tooth extraction 2012    Family History  Problem Relation Age of Onset  . Hypertension Mother   . Cancer Mother   . Anesthesia problems Neg Hx     History  Substance Use Topics  . Smoking status: Former Smoker -- 0.5 packs/day for 2 years    Types: Cigarettes  . Smokeless tobacco: Not on file   Comment: quit with + preg  . Alcohol Use: No    OB History    Grav Para Term Preterm Abortions TAB SAB Ect Mult Living   2    1 0 0 1 0 0      Review of Systems  All other systems reviewed and are negative.    Allergies  Review of patient's allergies indicates no known allergies.  Home Medications   Current Outpatient Rx  Name Route Sig Dispense Refill  . IBUPROFEN 200 MG PO TABS Oral Take 200 mg by mouth every 6 (six) hours as needed. For headaches.    . INSULIN PUMP  Subcutaneous Inject 1 each into the skin continuous. Patient gets 120 units daily with pump..      BP 115/66  Pulse 90  Temp 99 F (37.2 C) (Oral)  Resp 14  SpO2 97%  LMP 12/03/2011  Physical Exam  Nursing note and vitals reviewed. Constitutional: She is oriented to person, place, and time. She appears well-developed and well-nourished.  Non-toxic appearance. No distress.  HENT:  Head: Normocephalic and atraumatic.  Eyes: Conjunctivae, EOM and lids are normal. Pupils are equal, round, and reactive to light.  Neck: Normal range of motion. Neck supple. No tracheal deviation present. No Brudzinski's sign and no Kernig's sign noted. No mass present.  Cardiovascular: Normal rate, regular rhythm and normal heart sounds.  Exam reveals no gallop.   No murmur heard. Pulmonary/Chest: Effort normal and breath sounds normal. No stridor. No respiratory distress. She has no decreased breath sounds. She has no wheezes. She has no rhonchi. She has no rales.  Abdominal: Soft. Normal appearance and bowel sounds are normal. She exhibits no distension. There is no tenderness. There is no rebound and no CVA tenderness.  Musculoskeletal: Normal range of motion. She exhibits no edema and no tenderness.  Neurological: She is alert and oriented to person, place, and time. She  has normal strength. No cranial nerve deficit or sensory deficit. GCS eye subscore is 4. GCS verbal subscore is 5. GCS motor subscore is 6.  Skin: Skin is warm and dry. No abrasion and no rash noted.  Psychiatric: She has a normal mood and affect. Her speech is normal and behavior is normal.    ED Course  Procedures (including critical care time)  Labs Reviewed - No data to display No results found.   No diagnosis found.    MDM  Patient given Reglan and Benadryl for headache and closeness but at this time. Repeat neurological exam prior to discharge shows no signs of nuchal rigidity. She remains afebrile. Her mental status is  normal. No concern for subarachnoid or meningitis. Patient has a family history of migraines suspect that's what she has. Patient will be discharged home and was given followup instructions as well as return instructions. No family history of cerebral aneurysms        Toy [name], MD 04/30/12 2101

## 2012-04-30 NOTE — ED Notes (Signed)
MD at bedside. 

## 2012-04-30 NOTE — ED Notes (Signed)
Bedside report received from previous RN 

## 2012-07-06 ENCOUNTER — Inpatient Hospital Stay (HOSPITAL_COMMUNITY)
Admission: AD | Admit: 2012-07-06 | Discharge: 2012-07-08 | DRG: 781 | Disposition: A | Discharge status: Home / Self Care | Payer: Medicaid Other | Source: Ambulatory Visit | Attending: Obstetrics & Gynecology | Admitting: Obstetrics & Gynecology

## 2012-07-06 DIAGNOSIS — O479 False labor, unspecified: Secondary | ICD-10-CM

## 2012-07-06 DIAGNOSIS — E1065 Type 1 diabetes mellitus with hyperglycemia: Secondary | ICD-10-CM

## 2012-07-06 DIAGNOSIS — M5380 Other specified dorsopathies, site unspecified: Secondary | ICD-10-CM | POA: Diagnosis present

## 2012-07-06 DIAGNOSIS — O4700 False labor before 37 completed weeks of gestation, unspecified trimester: Secondary | ICD-10-CM | POA: Diagnosis present

## 2012-07-06 DIAGNOSIS — O24919 Unspecified diabetes mellitus in pregnancy, unspecified trimester: Principal | ICD-10-CM | POA: Diagnosis present

## 2012-07-06 DIAGNOSIS — E1110 Type 2 diabetes mellitus with ketoacidosis without coma: Secondary | ICD-10-CM

## 2012-07-06 DIAGNOSIS — IMO0002 Reserved for concepts with insufficient information to code with codable children: Secondary | ICD-10-CM | POA: Diagnosis present

## 2012-07-06 DIAGNOSIS — M62830 Muscle spasm of back: Secondary | ICD-10-CM

## 2012-07-06 LAB — COMPREHENSIVE METABOLIC PANEL
Alkaline Phosphatase: 75 U/L (ref 39–117)
BUN: 11 mg/dL (ref 6–23)
CO2: 20 mEq/L (ref 19–32)
Chloride: 99 mEq/L (ref 96–112)
Creatinine, Ser: 0.46 mg/dL — ABNORMAL LOW (ref 0.50–1.10)
GFR calc Af Amer: >90 mL/min (ref >90)
GFR calc non Af Amer: >90 mL/min (ref >90)
Glucose, Bld: 300 mg/dL — ABNORMAL HIGH (ref 70–99)
Potassium: 3.8 mEq/L (ref 3.5–5.1)
Total Bilirubin: 0.3 mg/dL (ref 0.3–1.2)

## 2012-07-06 LAB — BLOOD GAS, ARTERIAL
Acid-base deficit: 2.5 mmol/L — ABNORMAL HIGH (ref 0.0–2.0)
Drawn by: 143
FIO2: 0.21 %

## 2012-07-06 LAB — CBC
HCT: 35.4 % — ABNORMAL LOW (ref 36.0–46.0)
Hemoglobin: 12.4 g/dL (ref 12.0–15.0)
MCV: 91 fL (ref 78.0–100.0)
RBC: 3.89 MIL/uL (ref 3.87–5.11)
RDW: 11.9 % (ref 11.5–15.5)
WBC: 10.2 10*3/uL (ref 4.0–10.5)

## 2012-07-06 LAB — URINALYSIS, ROUTINE W REFLEX MICROSCOPIC
Bilirubin Urine: NEGATIVE
Glucose, UA: >1000 mg/dL — AB
Ketones, ur: 40 mg/dL — AB
Leukocytes, UA: NEGATIVE
Specific Gravity, Urine: 1.01 (ref 1.005–1.030)
pH: 6 (ref 5.0–8.0)

## 2012-07-06 LAB — URINE MICROSCOPIC-ADD ON

## 2012-07-06 LAB — GLUCOSE, CAPILLARY
Glucose-Capillary: 125 mg/dL — ABNORMAL HIGH (ref 70–99)
Glucose-Capillary: 155 mg/dL — ABNORMAL HIGH (ref 70–99)

## 2012-07-06 LAB — OB RESULTS CONSOLE GBS: GBS: NEGATIVE

## 2012-07-06 LAB — MRSA PCR SCREENING: MRSA by PCR: NEGATIVE

## 2012-07-06 MED ORDER — SODIUM CHLORIDE 0.9 % IV SOLN
INTRAVENOUS | Status: AC
  Administered 2012-07-06: 0.7 [IU]/h via INTRAVENOUS
  Administered 2012-07-07: 5.8 [IU]/h via INTRAVENOUS
  Administered 2012-07-07: 2.8 [IU]/h via INTRAVENOUS
  Administered 2012-07-07: 4 [IU]/h via INTRAVENOUS
  Administered 2012-07-07: 4.4 [IU]/h via INTRAVENOUS
  Administered 2012-07-07: 7.2 [IU]/h via INTRAVENOUS
  Administered 2012-07-07: 4.5 [IU]/h via INTRAVENOUS
  Filled 2012-07-06: qty 1

## 2012-07-06 MED ORDER — PRENATAL MULTIVITAMIN CH
1.0000 | ORAL_TABLET | Freq: Every day | ORAL | Status: DC
  Administered 2012-07-07: 1 via ORAL
  Filled 2012-07-06: qty 1

## 2012-07-06 MED ORDER — CYCLOBENZAPRINE HCL 10 MG PO TABS
5.0000 mg | ORAL_TABLET | Freq: Once | ORAL | Status: DC
  Filled 2012-07-06: qty 1

## 2012-07-06 MED ORDER — DEXTROSE IN LACTATED RINGERS 5 % IV SOLN
INTRAVENOUS | Status: DC
  Administered 2012-07-06 – 2012-07-07 (×2): via INTRAVENOUS

## 2012-07-06 MED ORDER — DOCUSATE SODIUM 100 MG PO CAPS
100.0000 mg | ORAL_CAPSULE | Freq: Every day | ORAL | Status: DC
  Administered 2012-07-07: 100 mg via ORAL
  Filled 2012-07-06: qty 1

## 2012-07-06 MED ORDER — CALCIUM CARBONATE ANTACID 500 MG PO CHEW: 2.0000 | CHEWABLE_TABLET | ORAL | Status: DC | PRN

## 2012-07-06 MED ORDER — INSULIN ASPART 100 UNIT/ML ~~LOC~~ SOLN: 2.0000 [IU] | SUBCUTANEOUS | Status: DC

## 2012-07-06 MED ORDER — CYCLOBENZAPRINE HCL 10 MG PO TABS
10.0000 mg | ORAL_TABLET | Freq: Three times a day (TID) | ORAL | Status: DC | PRN
  Administered 2012-07-06 – 2012-07-07 (×3): 10 mg via ORAL
  Filled 2012-07-06 (×3): qty 1

## 2012-07-06 MED ORDER — ZOLPIDEM TARTRATE 5 MG PO TABS
5.0000 mg | ORAL_TABLET | Freq: Every evening | ORAL | Status: DC | PRN
  Filled 2012-07-06: qty 1

## 2012-07-06 MED ORDER — INSULIN REGULAR HUMAN 100 UNIT/ML IJ SOLN: 4.0000 [IU] | Freq: Once | INTRAMUSCULAR | Status: DC

## 2012-07-06 MED ORDER — INSULIN ASPART 100 UNIT/ML ~~LOC~~ SOLN
4.0000 [IU] | Freq: Once | SUBCUTANEOUS | Status: AC
  Administered 2012-07-06: 4 [IU] via SUBCUTANEOUS

## 2012-07-06 MED ORDER — INSULIN REGULAR HUMAN 100 UNIT/ML IJ SOLN
10.0000 [IU] | Freq: Once | INTRAMUSCULAR | Status: AC
  Administered 2012-07-06: 10 [IU] via SUBCUTANEOUS

## 2012-07-06 MED ORDER — ACETAMINOPHEN 325 MG PO TABS
650.0000 mg | ORAL_TABLET | ORAL | Status: DC | PRN
  Administered 2012-07-07 (×2): 650 mg via ORAL
  Filled 2012-07-06 (×2): qty 2

## 2012-07-06 NOTE — Progress Notes (Signed)
Pt state she feels a dull pain constantly with a sharp pain that comes and goes

## 2012-07-06 NOTE — MAU Note (Signed)
Pt states pain started this morning when she woke and has gotten worse throughout the day

## 2012-07-06 NOTE — H&P (Signed)
[name] is a 21 y.o. female presenting for back pain  . Maternal Medical History:  Reason for admission: Reason for admission: contractions.  Reason for Admission:   nauseaContractions: Onset was 1-2 hours ago.   Frequency: irregular.   Perceived severity is mild.    Fetal activity: Perceived fetal activity is normal.    Prenatal Complications - Diabetes: type 1. Diabetes is managed by insulin pump.      INSULIN PUMP:  Basal Rates:  MN-8a = 1unit                        8a-2pm= 1.5 units                        2p - 8a = 1.2 units  Boluses with Meals = 1 unit per 7 carbs   OB History    Grav Para Term Preterm Abortions TAB SAB Ect Mult Living   3    1 0 0 1 0 0     Past Medical History  Diagnosis Date  . 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 Corneal abrasion   . Candidiasis   . Tubal pregnancy   . Diabetes mellitus     dx age 24  . Diabetes mellitus type I   . Urinary tract infection    Past Surgical History  Procedure Date  . Wisdom tooth extraction 2012   Family History: family history includes Cancer in her mother and Hypertension in her mother.  There is no history of Anesthesia problems. Social History:  reports that she has quit smoking. Her smoking use included Cigarettes. She has a 1 pack-year smoking history. She does not have any smokeless tobacco history on file. She reports that she does not drink alcohol or use illicit drugs.   Prenatal Transfer Tool  Maternal Diabetes: Yes:  Diabetes Type:  Insulin/Medication controlled Genetic Screening: Normal Maternal Ultrasounds/Referrals: Normal Fetal Ultrasounds or other Referrals:  None Maternal Substance Abuse:  No Significant Maternal Medications:  Meds include: Other:  Significant Maternal Lab Results:  Lab values include: Other:  Other Comments:  None  Review of Systems  Constitutional: Negative for fever.  Gastrointestinal: Negative for nausea, vomiting and abdominal  pain.  Musculoskeletal: Positive for back pain.      Blood pressure 135/75, pulse 99, temperature 97.8 F (36.6 C), temperature source Oral, resp. rate 16, height 5\' 5"  (1.651 m), weight 149 lb 12.8 oz (67.949 kg), last menstrual period 12/03/2011, SpO2 100.00%. Maternal Exam:  Uterine Assessment: Contraction strength is mild.  Contraction frequency is irregular.   Abdomen: Fundal height is 27.   Estimated fetal weight is 3.    Introitus: Normal vulva. Normal vagina.  Ferning test: not done.  Nitrazine test: not done. Amniotic fluid character: not assessed.  Pelvis: adequate for delivery.   Cervix: Cervix evaluated by digital exam.     Fetal Exam Fetal Monitor Review: Mode: ultrasound.   Baseline rate: 145.  Variability: moderate (6-25 bpm).   Pattern: accelerations present and no decelerations.    Fetal State Assessment: Category I - tracings are normal.     Physical Exam  Constitutional: She is oriented to person, place, and time. She appears well-developed and well-nourished.  HENT:  Head: Normocephalic.  Cardiovascular: Normal rate.   Respiratory: Effort normal.  GI: Soft. She exhibits no distension and no mass. There is no tenderness. There is no rebound and no  guarding.  Musculoskeletal: Normal range of motion. She exhibits tenderness (over right mid-paraspinous muscle).  Neurological: She is alert and oriented to person, place, and time.  Skin: Skin is warm and dry.  Psychiatric: She has a normal mood and affect.    Prenatal labs: ABO, Rh: --/--/O POS (04/24 1600) Antibody:   Rubella: 7.1 (04/24 1600) RPR: NON REACTIVE (04/24 1600)  HBsAg: NEGATIVE (04/24 1600)  HIV:    GBS:     Results for orders placed during the hospital encounter of 07/06/12 (from the past 24 hour(s))  URINALYSIS, ROUTINE W REFLEX MICROSCOPIC     Status: Abnormal   Collection Time   07/06/12  5:30 PM      Component Value Range   Color, Urine YELLOW  YELLOW   APPearance CLEAR  CLEAR    Specific Gravity, Urine 1.010  1.005 - 1.030   pH 6.0  5.0 - 8.0   Glucose, UA >1000 (*) NEGATIVE mg/dL   Hgb urine dipstick NEGATIVE  NEGATIVE   Bilirubin Urine NEGATIVE  NEGATIVE   Ketones, ur 40 (*) NEGATIVE mg/dL   Protein, ur NEGATIVE  NEGATIVE mg/dL   Urobilinogen, UA 0.2  0.0 - 1.0 mg/dL   Nitrite NEGATIVE  NEGATIVE   Leukocytes, UA NEGATIVE  NEGATIVE  URINE MICROSCOPIC-ADD ON     Status: Abnormal   Collection Time   07/06/12  5:30 PM      Component Value Range   Squamous Epithelial / LPF MANY (*) RARE   WBC, UA 3-6  <3 WBC/hpf   RBC / HPF 0-2  <3 RBC/hpf   Bacteria, UA MANY (*) RARE  GLUCOSE, CAPILLARY     Status: Abnormal   Collection Time   07/06/12  6:05 PM      Component Value Range   Glucose-Capillary 369 (*) 70 - 99 mg/dL   Comment 1 Notify RN    CBC     Status: Abnormal   Collection Time   07/06/12  6:25 PM      Component Value Range   WBC 10.2  4.0 - 10.5 K/uL   RBC 3.89  3.87 - 5.11 MIL/uL   Hemoglobin 12.4  12.0 - 15.0 g/dL   HCT 16.1 (*) 09.6 - 04.5 %   MCV 91.0  78.0 - 100.0 fL   MCH 31.9  26.0 - 34.0 pg   MCHC 35.0  30.0 - 36.0 g/dL   RDW 40.9  81.1 - 91.4 %   Platelets 294  150 - 400 K/uL    Assessment/Plan: A;  SIUP at [redacted]w[redacted]d      Preterm contractions with no change in cervix (unable to do FFn due to recent IC)      Muscle spasm in back      Hyperglycemia, r/o DKA  P:  Discussed with Dr Marice Potter      Admit to Ante      DKA labs      GBS done       Insulin 10 units       CBG q 30 min       Plan will continue based on results  [facility] 07/06/2012, 6:31 PM  Anion Gap = 13 (elevated) Osmolality = 31.3  Moderate DKA  CBG 220 after Insulin. Will give another 4 units  Will admit to AICU and start Glucomander

## 2012-07-06 NOTE — MAU Note (Signed)
Patient states she has had right flank pain on and off for a couple of weeks but usually goes away. Started this am and not going away and more painful. Denies any contractions, bleeding or leaking and reports good fetal movement. R CVA tenderness in triage.

## 2012-07-07 ENCOUNTER — Encounter (HOSPITAL_COMMUNITY): Payer: Self-pay | Admitting: Family

## 2012-07-07 DIAGNOSIS — O24919 Unspecified diabetes mellitus in pregnancy, unspecified trimester: Secondary | ICD-10-CM

## 2012-07-07 DIAGNOSIS — E1065 Type 1 diabetes mellitus with hyperglycemia: Secondary | ICD-10-CM

## 2012-07-07 DIAGNOSIS — M5380 Other specified dorsopathies, site unspecified: Secondary | ICD-10-CM

## 2012-07-07 DIAGNOSIS — O4700 False labor before 37 completed weeks of gestation, unspecified trimester: Secondary | ICD-10-CM

## 2012-07-07 DIAGNOSIS — E108 Type 1 diabetes mellitus with unspecified complications: Secondary | ICD-10-CM

## 2012-07-07 LAB — GLUCOSE, CAPILLARY
Glucose-Capillary: 106 mg/dL — ABNORMAL HIGH (ref 70–99)
Glucose-Capillary: 115 mg/dL — ABNORMAL HIGH (ref 70–99)
Glucose-Capillary: 129 mg/dL — ABNORMAL HIGH (ref 70–99)
Glucose-Capillary: 131 mg/dL — ABNORMAL HIGH (ref 70–99)
Glucose-Capillary: 142 mg/dL — ABNORMAL HIGH (ref 70–99)
Glucose-Capillary: 150 mg/dL — ABNORMAL HIGH (ref 70–99)
Glucose-Capillary: 153 mg/dL — ABNORMAL HIGH (ref 70–99)
Glucose-Capillary: 158 mg/dL — ABNORMAL HIGH (ref 70–99)
Glucose-Capillary: 162 mg/dL — ABNORMAL HIGH (ref 70–99)
Glucose-Capillary: 174 mg/dL — ABNORMAL HIGH (ref 70–99)
Glucose-Capillary: 87 mg/dL (ref 70–99)
Glucose-Capillary: 98 mg/dL (ref 70–99)

## 2012-07-07 LAB — COMPREHENSIVE METABOLIC PANEL
ALT: <5 U/L (ref 0–35)
Calcium: 8.8 mg/dL (ref 8.4–10.5)
Creatinine, Ser: 0.52 mg/dL (ref 0.50–1.10)
GFR calc Af Amer: >90 mL/min (ref >90)
GFR calc non Af Amer: >90 mL/min (ref >90)
Glucose, Bld: 132 mg/dL — ABNORMAL HIGH (ref 70–99)
Sodium: 136 mEq/L (ref 135–145)
Total Protein: 5.9 g/dL — ABNORMAL LOW (ref 6.0–8.3)

## 2012-07-07 LAB — HEMOGLOBIN A1C: Hgb A1c MFr Bld: 9.1 % — ABNORMAL HIGH (ref <5.7)

## 2012-07-07 MED ORDER — DEXTROSE IN LACTATED RINGERS 5 % IV SOLN: INTRAVENOUS | Status: AC

## 2012-07-07 MED ORDER — SODIUM CHLORIDE 0.9 % IJ SOLN
3.0000 mL | Freq: Two times a day (BID) | INTRAMUSCULAR | Status: DC
  Administered 2012-07-07: 3 mL via INTRAVENOUS

## 2012-07-07 MED ORDER — SODIUM CHLORIDE 0.9 % IV SOLN: 250.0000 mL | INTRAVENOUS | Status: DC | PRN

## 2012-07-07 MED ORDER — SODIUM CHLORIDE 0.9 % IJ SOLN
3.0000 mL | INTRAMUSCULAR | Status: DC | PRN
  Administered 2012-07-07: 3 mL via INTRAVENOUS

## 2012-07-07 MED ORDER — BUPIVACAINE HCL (PF) 0.5 % IJ SOLN
10.0000 mL | Freq: Once | INTRAMUSCULAR | Status: AC
  Administered 2012-07-07: 10 mL
  Filled 2012-07-07: qty 10

## 2012-07-07 MED ORDER — INSULIN PUMP
Freq: Three times a day (TID) | SUBCUTANEOUS | Status: DC
  Administered 2012-07-07: 16:00:00 via SUBCUTANEOUS
  Administered 2012-07-07: 1.2 via SUBCUTANEOUS
  Administered 2012-07-08: 1 via SUBCUTANEOUS
  Filled 2012-07-07: qty 1

## 2012-07-07 NOTE — Progress Notes (Signed)
UR chart review completed.  

## 2012-07-07 NOTE — Progress Notes (Signed)
Insulin Pump Contract reviewed & signed by pt & RN. Pt restarted her insulin pump per MD orders @previous  settings. Settings per pt: 00:00 to 08:00 = 1unit per hr;  08:00 to 14:00 = 1.5 units per hr;  14:00 to 24:00 = 1.2 units per hr. Insulin confirmed to be Novolog.

## 2012-07-07 NOTE — Progress Notes (Signed)
Pt does not feel cxn seen on ext monitor.  Pt reports good fetal movement with no vaginal discharge noted.

## 2012-07-07 NOTE — Progress Notes (Signed)
Patient ID: [name], female   DOB: 09/16/1991, 21 y.o.   MRN: [id number] [facility] PRACTICE ANTEPARTUM COMPREHENSIVE PROGRESS NOTE  [name] is a 21 y.o. G3P0010 at [redacted]w[redacted]d  who is admitted for uncontrolled diabetes.   Fetal presentation is unk Length of Stay:  1  Days  Subjective: Pt denies complaints overnight. +FM, no ctx, No LOF.  Pt checks FSG ~2x/day at home.  Was not sure of ranges where her glucose should be.  Vitals:  Blood pressure 115/67, pulse 96, temperature 97.7 F (36.5 C), temperature source Oral, resp. rate 18, height 5\' 5"  (1.651 m), weight 67.631 kg (149 lb 1.6 oz), last menstrual period 12/03/2011, SpO2 100.00%. Physical Examination: General appearance - alert, well appearing, and in no distress and normal appearing weight Heart - normal rate, regular rhythm, normal S1, S2, no murmurs, rubs, clicks or gallops Abdomen - soft, nontender, nondistended, no masses or organomegaly Cervical Exam: Not evaluated. and found to be not evaluated/ Fetal Monitoring:  Baseline: 130 bpm  Labs:  Recent Results (from the past 24 hour(s))  URINALYSIS, ROUTINE W REFLEX MICROSCOPIC   Collection Time   07/06/12  5:30 PM      Component Value Range   Color, Urine YELLOW  YELLOW   APPearance CLEAR  CLEAR   Specific Gravity, Urine 1.010  1.005 - 1.030   pH 6.0  5.0 - 8.0   Glucose, UA >1000 (*) NEGATIVE mg/dL   Hgb urine dipstick NEGATIVE  NEGATIVE   Bilirubin Urine NEGATIVE  NEGATIVE   Ketones, ur 40 (*) NEGATIVE mg/dL   Protein, ur NEGATIVE  NEGATIVE mg/dL   Urobilinogen, UA 0.2  0.0 - 1.0 mg/dL   Nitrite NEGATIVE  NEGATIVE   Leukocytes, UA NEGATIVE  NEGATIVE  URINE MICROSCOPIC-ADD ON   Collection Time   07/06/12  5:30 PM      Component Value Range   Squamous Epithelial / LPF MANY (*) RARE   WBC, UA 3-6  <3 WBC/hpf   RBC / HPF 0-2  <3 RBC/hpf   Bacteria, UA MANY (*) RARE  GLUCOSE, CAPILLARY   Collection Time   07/06/12  6:05 PM      Component Value Range   Glucose-Capillary 369 (*) 70 - 99 mg/dL   Comment 1 Notify RN    CBC   Collection Time   07/06/12  6:25 PM      Component Value Range   WBC 10.2  4.0 - 10.5 K/uL   RBC 3.89  3.87 - 5.11 MIL/uL   Hemoglobin 12.4  12.0 - 15.0 g/dL   HCT 11.9 (*) 14.7 - 82.9 %   MCV 91.0  78.0 - 100.0 fL   MCH 31.9  26.0 - 34.0 pg   MCHC 35.0  30.0 - 36.0 g/dL   RDW 56.2  13.0 - 86.5 %   Platelets 294  150 - 400 K/uL  COMPREHENSIVE METABOLIC PANEL   Collection Time   07/06/12  6:25 PM      Component Value Range   Sodium 132 (*) 135 - 145 mEq/L   Potassium 3.8  3.5 - 5.1 mEq/L   Chloride 99  96 - 112 mEq/L   CO2 20  19 - 32 mEq/L   Glucose, Bld 300 (*) 70 - 99 mg/dL   BUN 11  6 - 23 mg/dL   Creatinine, Ser 7.84 (*) 0.50 - 1.10 mg/dL   Calcium 9.0  8.4 - 69.6 mg/dL   Total Protein 6.8  6.0 - 8.3 g/dL  Albumin 2.8 (*) 3.5 - 5.2 g/dL   AST 9  0 - 37 U/L   ALT 5  0 - 35 U/L   Alkaline Phosphatase 75  39 - 117 U/L   Total Bilirubin 0.3  0.3 - 1.2 mg/dL   GFR calc non Af Amer >90  >90 mL/min   GFR calc Af Amer >90  >90 mL/min  HEMOGLOBIN A1C   Collection Time   07/06/12  6:25 PM      Component Value Range   Hemoglobin A1C 9.1 (*) <5.7 %   Mean Plasma Glucose 214 (*) <117 mg/dL  BLOOD GAS, ARTERIAL   Collection Time   07/06/12  7:32 PM      Component Value Range   FIO2 0.21     O2 Content ROOM AIR     pH, Arterial 7.438  7.350 - 7.450   pCO2 arterial 30.5 (*) 35.0 - 45.0 mmHg   pO2, Arterial 90.5  80.0 - 100.0 mmHg   Bicarbonate 20.3  20.0 - 24.0 mEq/L   TCO2 21.2  0 - 100 mmol/L   Acid-base deficit 2.5 (*) 0.0 - 2.0 mmol/L   Collection site RADIAL     Drawn by 143     Sample type ARTERIAL     Allens test (pass/fail) PASS  PASS  GLUCOSE, CAPILLARY   Collection Time   07/06/12  7:50 PM      Component Value Range   Glucose-Capillary 220 (*) 70 - 99 mg/dL  MRSA PCR SCREENING   Collection Time   07/06/12  8:45 PM      Component Value Range   MRSA by PCR NEGATIVE  NEGATIVE  GLUCOSE,  CAPILLARY   Collection Time   07/06/12  8:51 PM      Component Value Range   Glucose-Capillary 181 (*) 70 - 99 mg/dL   Comment 1 Documented in Chart     Comment 2 Notify RN    GLUCOSE, CAPILLARY   Collection Time   07/06/12  9:54 PM      Component Value Range   Glucose-Capillary 154 (*) 70 - 99 mg/dL   Comment 1 Notify RN     Comment 2 Documented in Chart    GLUCOSE, CAPILLARY   Collection Time   07/06/12 10:31 PM      Component Value Range   Glucose-Capillary 125 (*) 70 - 99 mg/dL  GLUCOSE, CAPILLARY   Collection Time   07/06/12 11:33 PM      Component Value Range   Glucose-Capillary 155 (*) 70 - 99 mg/dL  GLUCOSE, CAPILLARY   Collection Time   07/07/12 12:33 AM      Component Value Range   Glucose-Capillary 174 (*) 70 - 99 mg/dL  GLUCOSE, CAPILLARY   Collection Time   07/07/12  1:35 AM      Component Value Range   Glucose-Capillary 144 (*) 70 - 99 mg/dL  GLUCOSE, CAPILLARY   Collection Time   07/07/12  2:44 AM      Component Value Range   Glucose-Capillary 153 (*) 70 - 99 mg/dL  GLUCOSE, CAPILLARY   Collection Time   07/07/12  3:45 AM      Component Value Range   Glucose-Capillary 131 (*) 70 - 99 mg/dL  GLUCOSE, CAPILLARY   Collection Time   07/07/12  4:46 AM      Component Value Range   Glucose-Capillary 89  70 - 99 mg/dL  GLUCOSE, CAPILLARY   Collection Time   07/07/12  5:49  AM      Component Value Range   Glucose-Capillary 138 (*) 70 - 99 mg/dL  GLUCOSE, CAPILLARY   Collection Time   07/07/12  6:49 AM      Component Value Range   Glucose-Capillary 120 (*) 70 - 99 mg/dL  GLUCOSE, CAPILLARY   Collection Time   07/07/12  7:51 AM      Component Value Range   Glucose-Capillary 87  70 - 99 mg/dL  GLUCOSE, CAPILLARY   Collection Time   07/07/12  8:54 AM      Component Value Range   Glucose-Capillary 162 (*) 70 - 99 mg/dL  GLUCOSE, CAPILLARY   Collection Time   07/07/12 10:02 AM      Component Value Range   Glucose-Capillary 106 (*) 70 - 99 mg/dL  GLUCOSE,  CAPILLARY   Collection Time   07/07/12 11:04 AM      Component Value Range   Glucose-Capillary 98  70 - 99 mg/dL   Comment 1 Documented in Chart     Comment 2 Notify RN    COMPREHENSIVE METABOLIC PANEL   Collection Time   07/07/12 12:00 PM      Component Value Range   Sodium 136  135 - 145 mEq/L   Potassium 3.5  3.5 - 5.1 mEq/L   Chloride 104  96 - 112 mEq/L   CO2 22  19 - 32 mEq/L   Glucose, Bld 132 (*) 70 - 99 mg/dL   BUN 11  6 - 23 mg/dL   Creatinine, Ser 9.60  0.50 - 1.10 mg/dL   Calcium 8.8  8.4 - 45.4 mg/dL   Total Protein 5.9 (*) 6.0 - 8.3 g/dL   Albumin 2.4 (*) 3.5 - 5.2 g/dL   AST 13  0 - 37 U/L   ALT <5  0 - 35 U/L   Alkaline Phosphatase 62  39 - 117 U/L   Total Bilirubin 0.2 (*) 0.3 - 1.2 mg/dL   GFR calc non Af Amer >90  >90 mL/min   GFR calc Af Amer >90  >90 mL/min  GLUCOSE, CAPILLARY   Collection Time   07/07/12 12:05 PM      Component Value Range   Glucose-Capillary 129 (*) 70 - 99 mg/dL  GLUCOSE, CAPILLARY   Collection Time   07/07/12  1:07 PM      Component Value Range   Glucose-Capillary 150 (*) 70 - 99 mg/dL    Imaging Studies:       Medications:  Scheduled    . docusate sodium  100 mg Oral Daily  . insulin aspart  4 Units Subcutaneous Once  . insulin regular  10 Units Subcutaneous Once  . prenatal multivitamin  1 tablet Oral Daily  . DISCONTD: cyclobenzaprine  5 mg Oral Once  . DISCONTD: insulin aspart  2-8 Units Subcutaneous Q1 Hr x 6  . DISCONTD: insulin regular  4 Units Subcutaneous Once   I have reviewed the patient's current medications.  ASSESSMENT: Patient Active Problem List  Diagnosis  . Ectopic pregnancy, tubal, right  . Diabetic ketoacidosis  . Tobacco abuse  . Dehydration  . Bipolar II disorder  . DM (diabetes mellitus), type 1, uncontrolled  Acidosis imporved  PLAN: Refer to Diabetic management for recommendations on meds Chem 12 now If labs normal will convert back to insuline pump and transfer to antepartum  Continue  routine antenatal care.   HARRAWAY-SMITH, Saga Balthazar 07/07/2012,1:39 PM

## 2012-07-07 NOTE — Progress Notes (Signed)
Pt transferred ambulatory to rm #[id number]

## 2012-07-07 NOTE — Progress Notes (Signed)
Pt administered a bolus of 7.8 units for carb coverage via her insulin pump

## 2012-07-07 NOTE — Progress Notes (Signed)
Inpatient Diabetes Program Recommendations  Diabetes in Pregnancy Target Range  Fasting -                    60 - 90 mg/dL Preparandial -            60 - 105 mg/dL Postprandial -            <140 mg/dL  Consult received regarding 21 yo WF at [redacted] weeks gestation with Type 1 Diabetes.  Pt uses insulin pump at home.   Results for EVERLYNN, SAGUN (MRN [id number]) as of 07/07/2012 11:51  Ref. Range 07/06/2012 18:25  Hemoglobin A1C Latest Range: <5.7 % 9.1 (H)  Results for ANKITA, NEWCOMER (MRN [id number]) as of 07/07/2012 11:51  Ref. Range 07/07/2012 06:49 07/07/2012 07:51 07/07/2012 08:54 07/07/2012 10:02 07/07/2012 11:04  Glucose-Capillary Latest Range: 70-99 mg/dL 130 (H) 87 865 (H) 784 (H) 98  Results for CHRIS, NARASIMHAN (MRN [id number]) as of 07/07/2012 11:51  Ref. Range 07/06/2012 18:25  Sodium Latest Range: 135-145 mEq/L 132 (L)  Potassium Latest Range: 3.5-5.1 mEq/L 3.8  Chloride Latest Range: 96-112 mEq/L 99  CO2 Latest Range: 19-32 mEq/L 20  Mean Plasma Glucose Latest Range: <117 mg/dL 132 (H)  BUN Latest Range: 6-23 mg/dL 11  Creatinine Latest Range: 0.50-1.10 mg/dL 4.40 (L)  Calcium Latest Range: 8.4-10.5 mg/dL 9.0  GFR calc non Af Amer Latest Range: >90 mL/min >90  GFR calc Af Amer Latest Range: >90 mL/min >90  Glucose Latest Range: 70-99 mg/dL 102 (H)   On GlucoStabilizer at present.  I am not comfortable recommending changes with her insulin pump.  When transitioning from IV insulin to pump, continue with GlucoStabilizer 1 - 2 hours after restarting pump. Recommend Endocrinology Consult to manage DM.  Note on 12/24/11 states that Dr. Lucianne Muss is pt's endocrinologist. Pt has poor control prior to hospitalization.     Thank you.  Ailene Ards, RD, LDN, CDE Inpatient Diabetes Coordinator Pager: [phone]

## 2012-07-08 LAB — GLUCOSE, CAPILLARY: Glucose-Capillary: 77 mg/dL (ref 70–99)

## 2012-07-08 MED ORDER — CYCLOBENZAPRINE HCL 10 MG PO TABS: 10.0000 mg | ORAL_TABLET | Freq: Three times a day (TID) | ORAL | Status: DC | PRN

## 2012-07-08 NOTE — Discharge Summary (Signed)
Physician Discharge Summary  Patient ID: [name] MRN: [id number] DOB/AGE: 11-Apr-1991 21 y.o.  Admit date: 07/06/2012 Discharge date: 07/08/2012  Admission Diagnoses:Pregnancy at 28 weeks,Class D Diabetes, poor compliance  Discharge Diagnoses:  Same as above   Discharged Condition: stable  Hospital Course: diabetic management  Consults: None  Significant Diagnostic Studies: none  Treatments: IV hydration and insulin: Humalog  Discharge Exam: Blood pressure 110/72, pulse 106, temperature 97.6 F (36.4 C), temperature source Oral, resp. rate 18, height 5\' 5"  (1.651 m), weight 149 lb 1.6 oz (67.631 kg), last menstrual period 12/03/2011, SpO2 97.00%. General appearance: alert, cooperative and no distress Back: symmetric, no curvature. ROM normal. No CVA tenderness.  Disposition: 01-Home or Self Care  Discharge Orders    Future Orders Please Complete By Expires   Diet - low sodium heart healthy      Increase activity slowly      Call MD for:  persistant nausea and vomiting          Medication List     As of 07/08/2012  7:24 AM    TAKE these medications         cyclobenzaprine 10 MG tablet   Commonly known as: FLEXERIL   Take 1 tablet (10 mg total) by mouth 3 (three) times daily as needed for muscle spasms.      diphenhydrAMINE 25 mg capsule   Commonly known as: BENADRYL   Take 25 mg by mouth every 6 (six) hours as needed. For congestion      insulin pump 100 unit/ml Soln   Inject 1 each into the skin continuous. NovoLog Insulin 1 unit for every 7 grams of carbs      prenatal multivitamin Tabs   Take 1 tablet by mouth every morning.           Follow-up Information    Follow up with Lazaro Arms, MD.   Contact information:   [address] [place] [address] [phone]          Signed: Lazaro Arms 07/08/2012, 7:24 AM

## 2012-07-09 LAB — CULTURE, BETA STREP (GROUP B ONLY)

## 2012-08-23 ENCOUNTER — Inpatient Hospital Stay (HOSPITAL_COMMUNITY)
Admission: AD | Admit: 2012-08-23 | Discharge: 2012-08-23 | DRG: 781 | Disposition: A | Discharge status: Home / Self Care | Payer: PRIVATE HEALTH INSURANCE | Source: Ambulatory Visit | Attending: Obstetrics & Gynecology | Admitting: Obstetrics & Gynecology

## 2012-08-23 ENCOUNTER — Encounter (HOSPITAL_COMMUNITY): Payer: Self-pay | Admitting: *Deleted

## 2012-08-23 DIAGNOSIS — O4700 False labor before 37 completed weeks of gestation, unspecified trimester: Secondary | ICD-10-CM | POA: Diagnosis present

## 2012-08-23 DIAGNOSIS — E109 Type 1 diabetes mellitus without complications: Secondary | ICD-10-CM | POA: Diagnosis present

## 2012-08-23 DIAGNOSIS — O24919 Unspecified diabetes mellitus in pregnancy, unspecified trimester: Secondary | ICD-10-CM | POA: Diagnosis present

## 2012-08-23 DIAGNOSIS — Z794 Long term (current) use of insulin: Secondary | ICD-10-CM

## 2012-08-23 DIAGNOSIS — IMO0002 Reserved for concepts with insufficient information to code with codable children: Principal | ICD-10-CM | POA: Diagnosis present

## 2012-08-23 HISTORY — DX: Gestational (pregnancy-induced) hypertension without significant proteinuria, unspecified trimester: O13.9

## 2012-08-23 LAB — COMPREHENSIVE METABOLIC PANEL
ALT: 6 U/L (ref 0–35)
Albumin: 2.4 g/dL — ABNORMAL LOW (ref 3.5–5.2)
BUN: 11 mg/dL (ref 6–23)
Calcium: 8.5 mg/dL (ref 8.4–10.5)
GFR calc Af Amer: >90 mL/min (ref >90)
Glucose, Bld: 159 mg/dL — ABNORMAL HIGH (ref 70–99)
Sodium: 133 mEq/L — ABNORMAL LOW (ref 135–145)
Total Protein: 5.6 g/dL — ABNORMAL LOW (ref 6.0–8.3)

## 2012-08-23 LAB — URINE MICROSCOPIC-ADD ON

## 2012-08-23 LAB — URINALYSIS, ROUTINE W REFLEX MICROSCOPIC
Glucose, UA: NEGATIVE mg/dL
Ketones, ur: NEGATIVE mg/dL
Protein, ur: 100 mg/dL — AB

## 2012-08-23 LAB — CBC
HCT: 37.7 % (ref 36.0–46.0)
MCHC: 34.5 g/dL (ref 30.0–36.0)
Platelets: 216 10*3/uL (ref 150–400)
RDW: 12.6 % (ref 11.5–15.5)

## 2012-08-23 LAB — GROUP B STREP BY PCR: Group B strep by PCR: NEGATIVE

## 2012-08-23 MED ORDER — PENICILLIN G POTASSIUM 5000000 UNITS IJ SOLR
5.0000 10*6.[IU] | Freq: Once | INTRAVENOUS | Status: DC
  Filled 2012-08-23: qty 5

## 2012-08-23 MED ORDER — ACETAMINOPHEN 325 MG PO TABS: 650.0000 mg | ORAL_TABLET | ORAL | Status: DC | PRN

## 2012-08-23 MED ORDER — LACTATED RINGERS IV SOLN: 500.0000 mL | INTRAVENOUS | Status: DC | PRN

## 2012-08-23 MED ORDER — LACTATED RINGERS IV SOLN
INTRAVENOUS | Status: DC
  Administered 2012-08-23 (×2): via INTRAVENOUS

## 2012-08-23 MED ORDER — OXYCODONE-ACETAMINOPHEN 5-325 MG PO TABS
1.0000 | ORAL_TABLET | ORAL | Status: DC | PRN
Start: 2012-08-23 — End: 2012-08-24

## 2012-08-23 MED ORDER — INSULIN PUMP
SUBCUTANEOUS | Status: DC
  Administered 2012-08-23: 2 via SUBCUTANEOUS
  Administered 2012-08-23: 1.8 via SUBCUTANEOUS
  Filled 2012-08-23: qty 1

## 2012-08-23 MED ORDER — FLEET ENEMA 7-19 GM/118ML RE ENEM: 1.0000 | ENEMA | RECTAL | Status: DC | PRN

## 2012-08-23 MED ORDER — ONDANSETRON HCL 4 MG/2ML IJ SOLN: 4.0000 mg | Freq: Four times a day (QID) | INTRAMUSCULAR | Status: DC | PRN

## 2012-08-23 MED ORDER — OXYTOCIN 40 UNITS IN LACTATED RINGERS INFUSION - SIMPLE MED: 62.5000 mL/h | INTRAVENOUS | Status: DC

## 2012-08-23 MED ORDER — OXYTOCIN BOLUS FROM INFUSION: 500.0000 mL | INTRAVENOUS | Status: DC

## 2012-08-23 MED ORDER — LIDOCAINE HCL (PF) 1 % IJ SOLN: 30.0000 mL | INTRAMUSCULAR | Status: DC | PRN

## 2012-08-23 MED ORDER — NALBUPHINE SYRINGE 5 MG/0.5 ML
10.0000 mg | INJECTION | INTRAMUSCULAR | Status: DC | PRN
  Administered 2012-08-23 (×2): 10 mg via INTRAVENOUS
  Filled 2012-08-23 (×3): qty 1

## 2012-08-23 MED ORDER — CITRIC ACID-SODIUM CITRATE 334-500 MG/5ML PO SOLN: 30.0000 mL | ORAL | Status: DC | PRN

## 2012-08-23 MED ORDER — IBUPROFEN 600 MG PO TABS: 600.0000 mg | ORAL_TABLET | Freq: Four times a day (QID) | ORAL | Status: DC | PRN

## 2012-08-23 MED ORDER — PENICILLIN G POTASSIUM 5000000 UNITS IJ SOLR
2.5000 10*6.[IU] | INTRAVENOUS | Status: DC
  Filled 2012-08-23 (×4): qty 2.5

## 2012-08-23 NOTE — Progress Notes (Signed)
Pt states her insulin pump runs at different rates during the day and covers with boluses when she eats;   0600-0800     1.4 units/hr 0800-1400     2.0units/hr 1400-MN       1.8units/hr MN-0600       1.35units/hr

## 2012-08-23 NOTE — Progress Notes (Signed)
Pt states weight was 164lb not 154

## 2012-08-23 NOTE — Progress Notes (Signed)
Dr. Despina Hidden gave patient some peanut butter and crackers and ordered diet

## 2012-08-23 NOTE — H&P (Signed)
Dannon Perlow is a 21 y.o. female at G2P0010 by at [redacted]w[redacted]d by 6w ultrasound presenting at 23 for onset of preterm labor. Previous pregnancy was an ectopic pregnancy treated with methotrexate in Janurary 2013. Today states contractions started around 0430. Pt is a type 1 diabetic normally on insulin pump (took off around 0900 when changing into hospital gown, restarted around 1145); she is fasting this morning. Pt denies bleeding or gush of fluid, endorses normal fetal movements. Pt has been evaluated for elevated BP at outpt clinic [facility], Dr. Despina Hidden); she was last seen on Monday in clinic and had a 24h urine collected but did not bring it in this morning.  Maternal Medical History:  Reason for admission: Reason for admission: contractions.  Contractions: Onset was 3-5 hours ago.   Frequency: regular.   Duration is approximately 1 minute.   Perceived severity is strong.    Fetal activity: Perceived fetal activity is normal.   Last perceived fetal movement was within the past hour.    Prenatal complications: Hypertension and pre-eclampsia (was being worked up as an Risk analyst; possible induction in planning stages for ?next 2 weeks).   No bleeding or HIV.   Prenatal Complications - Diabetes: type 1. Diabetes is managed by insulin pump.      OB History    Grav Para Term Preterm Abortions TAB SAB Ect Mult Living   2    1 0 0 1 0 0     Past Medical History  Diagnosis Date  . 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 Corneal abrasion   . Candidiasis   . Tubal pregnancy   . Diabetes mellitus     dx age 40  . Diabetes mellitus type I   . Urinary tract infection   . Pregnancy induced hypertension    Past Surgical History  Procedure Date  . Wisdom tooth extraction 2012   Family History: family history includes Cancer in her mother and Hypertension in her mother.  There is no history of Anesthesia problems. Social History:  reports that she has quit smoking. Her  smoking use included Cigarettes. She has a 1 pack-year smoking history. She does not have any smokeless tobacco history on file. She reports that she does not drink alcohol or use illicit drugs.  Review of Systems  Constitutional: Negative for fever and chills.  Eyes: Negative for blurred vision and double vision.  Respiratory: Negative for shortness of breath.   Gastrointestinal: Positive for abdominal pain. Negative for vomiting.  Neurological: Negative for headaches.    Dilation: 2.5 Effacement (%): 90 Station: 0 Exam by:: Dr. Thad Ranger  Blood pressure 127/72, pulse 80, temperature 97.3 F (36.3 C), temperature source Oral, resp. rate 20, last menstrual period 12/03/2011. Maternal Exam:  Uterine Assessment: Contraction strength is moderate.  Contraction duration is 1 minute. Contraction frequency is regular.   Introitus: Normal vulva. Normal vagina.  Ferning test: not done.  Nitrazine test: not done.  Pelvis: adequate for delivery.   Cervix: Cervix evaluated by digital exam.   2-3 / 90% / 0  Fetal Exam Fetal Monitor Review: Mode: ultrasound.   Baseline rate: 130.  Variability: moderate (6-25 bpm).   Pattern: no decelerations and no accelerations.    Fetal State Assessment: Category I - tracings are normal.    Cervical exam by Dr. Thad Ranger  Physical Exam  Constitutional: She is oriented to person, place, and time. She appears well-developed and well-nourished.  HENT:  Head: Normocephalic and  atraumatic.  Eyes: Conjunctivae normal are normal. Pupils are equal, round, and reactive to light.  Neck: Normal range of motion. Neck supple.  Cardiovascular: Normal rate, regular rhythm and normal heart sounds.  Exam reveals no friction rub.   No murmur heard. Respiratory: Effort normal. She has no wheezes.  GI: There is no tenderness. There is no rebound.  Musculoskeletal: She exhibits edema (bilateral in ankles, mild).  Neurological: She is alert and oriented to person, place, and  time.  Skin: Skin is warm and dry.  Psychiatric: She has a normal mood and affect.    Prenatal labs: ABO, Rh: --/--/O POS (04/24 1600) Antibody: Negative (05/02 0000) Rubella: 7.1 (04/24 1600) RPR: NON REACTIVE (04/24 1600)  HBsAg: NEGATIVE (04/24 1600)  HIV:    GBS:     Assessment/Plan: Dajane Valli is a 21 y.o. female at G2P0010 by at [redacted]w[redacted]d by 6w ultrasound presenting at 37 for onset of preterm labor. GBS status unknown -preterm labor  -admit to birthing suites  -Nubain for pain control; will hold off on epidural for now -work-up for PIH as outpt  -some elevated BP's to 140's systolic  -monitor pressures  -urine prot/creat ratio -maternal type 1 diabetes  -continue insulin pump (orders placed for pt-controlled bolus as needed with q2 CBG's, home basal rates)  -clear liquid diet -GBS status NEGATIVE  Dredyn Gubbels, [place] 08/23/2012, 12:51 PM

## 2012-08-23 NOTE — Progress Notes (Signed)
Dr. Thad Ranger updated regarding CBG and Dr. Despina Hidden order to feed patient and recheck CBG. Pt more alert and is feeling contractions. Dr. Thad Ranger to recheck patient for cervical change shortly. Pt currently eating.

## 2012-08-23 NOTE — Progress Notes (Signed)
Difficult tracing contractions while pt laying on her side, have adjusted toco, contractions palp mild-mod

## 2012-08-23 NOTE — Progress Notes (Signed)
Pt has an insulin pump to her rt lower quadrant, maintained by patient. Site appears WNL. Pt teaching regarding POC to check CBG q 2 hours at this time. Pt signed consent to use her own insulin pump and will maintain it per her previous instruction. Pt verbalizes understanding.

## 2012-08-23 NOTE — Progress Notes (Signed)
Dr Despina Hidden at bedside. Pt concerned about contractions. Dr Despina Hidden reassured pt ok to be discharged. Labor precautions reviewed. Pt verbalized understanding.

## 2012-08-23 NOTE — Progress Notes (Signed)
Pt CBG 70, pt states that is normal for her. Feels fine. Will cont to monitor.

## 2012-08-23 NOTE — Progress Notes (Signed)
[name] is a 21 y.o. G2P0010 at [redacted]w[redacted]d  Subjective: Comfortable with pain meds, has been sleeping so can't quantify contractions; contraction immediately after check was "stronger" than the ones earlier today  Objective: BP 124/73  Pulse 78  Temp 97.3 F (36.3 C) (Oral)  Resp 18  Ht 5\' 5"  (1.651 m)  Wt 74.39 kg (164 lb)  BMI 27.29 kg/m2  LMP 12/03/2011      FHT:  FHR: 130 bpm, variability: moderate,  accelerations:  Present,  decelerations:  Absent UC:   irregular, every 5-7 minutes SVE:   Dilation: 2.5 Effacement (%): 90 Station: 0 Exam by:: Dr. Thad Ranger   Labs: Lab Results  Component Value Date   WBC 11.6* 08/23/2012   HGB 13.0 08/23/2012   HCT 37.7 08/23/2012   MCV 91.1 08/23/2012   PLT 216 08/23/2012    Assessment / Plan: Spontaneous preterm labor  Labor: Contractions not picking up well on monitor; repositioned, will continue to evaluate Preeclampsia:  BP's improved, will monitor Fetal Wellbeing:  Category I Pain Control:  Nubain Anticipated MOD:  NSVD  Omid Deardorff 08/23/2012, 3:24 PM

## 2012-08-23 NOTE — Progress Notes (Signed)
Dr. Casper Harrison in labor and delivery, will be in to evaluate pt soon.

## 2012-08-23 NOTE — Progress Notes (Signed)
CBG 56, Dr Despina Hidden notified and ordered a 2400 ada diet for her. Pt denies feeling Hypoglycemic. Will recheck CBG after pt eats.

## 2012-08-23 NOTE — Discharge Summary (Signed)
Physician Discharge Summary  Patient ID: [name] MRN: [id number] DOB/AGE: 09-Jan-1991 21 y.o.  Admit date: 08/23/2012 Discharge date: 08/23/2012  Admission Diagnoses:  Discharge Diagnoses:  Active Problems:  * No active hospital problems. *    Discharged Condition: good  Hospital Course: unremarkable  Consults: None  Significant Diagnostic Studies: labs: protein creatinine ratio  Treatments: IV hydration  Discharge Exam: Blood pressure 144/92, pulse 94, temperature 97.7 F (36.5 C), temperature source Oral, resp. rate 18, height 5\' 5"  (1.651 m), weight 164 lb (74.39 kg), last menstrual period 12/03/2011. cervizx 2/90/-1  Disposition: 01-Home or Self Care  Discharge Orders    Future Orders Please Complete By Expires   Diet - low sodium heart healthy      Increase activity slowly          Medication List     As of 08/23/2012  8:11 PM    TAKE these medications         aspirin-sod bicarb-citric acid 325 MG Tbef   Commonly known as: ALKA-SELTZER   Take 650 mg by mouth every 4 (four) hours as needed. For cold symptoms      insulin pump 100 unit/ml Soln   Inject 1 each into the skin continuous. NovoLog Insulin 1 unit for every 7 grams of carbs           Follow-up Information    Follow up with Lazaro Arms, MD. On 08/25/2012.   Contact information:   [address]  [place] [address] [phone]          Signed: Lazaro Arms 08/23/2012, 8:11 PM

## 2012-08-23 NOTE — Progress Notes (Signed)
Pt had changed Insulin pump today, changes every 3 days per pt report.

## 2012-08-23 NOTE — MAU Note (Signed)
uc's since 0430, pt denies bleeding or LOF, labor eval.

## 2012-08-24 ENCOUNTER — Observation Stay (HOSPITAL_COMMUNITY): Payer: Medicaid Other

## 2012-08-24 ENCOUNTER — Inpatient Hospital Stay (HOSPITAL_COMMUNITY): Payer: Medicaid Other | Admitting: Anesthesiology

## 2012-08-24 ENCOUNTER — Encounter (HOSPITAL_COMMUNITY): Payer: Self-pay | Admitting: *Deleted

## 2012-08-24 ENCOUNTER — Inpatient Hospital Stay (HOSPITAL_COMMUNITY)
Admission: AD | Admit: 2012-08-24 | Discharge: 2012-08-27 | DRG: 774 | Disposition: A | Discharge status: Home / Self Care | Payer: Medicaid Other | Source: Ambulatory Visit | Attending: Obstetrics & Gynecology | Admitting: Obstetrics & Gynecology

## 2012-08-24 ENCOUNTER — Encounter (HOSPITAL_COMMUNITY): Payer: Self-pay | Admitting: Anesthesiology

## 2012-08-24 DIAGNOSIS — O479 False labor, unspecified: Secondary | ICD-10-CM | POA: Diagnosis present

## 2012-08-24 DIAGNOSIS — E109 Type 1 diabetes mellitus without complications: Secondary | ICD-10-CM | POA: Diagnosis present

## 2012-08-24 DIAGNOSIS — O2432 Unspecified pre-existing diabetes mellitus in childbirth: Secondary | ICD-10-CM | POA: Diagnosis present

## 2012-08-24 DIAGNOSIS — O4700 False labor before 37 completed weeks of gestation, unspecified trimester: Secondary | ICD-10-CM

## 2012-08-24 DIAGNOSIS — IMO0002 Reserved for concepts with insufficient information to code with codable children: Principal | ICD-10-CM | POA: Diagnosis present

## 2012-08-24 DIAGNOSIS — O1490 Unspecified pre-eclampsia, unspecified trimester: Secondary | ICD-10-CM

## 2012-08-24 LAB — CBC
HCT: 36.5 % (ref 36.0–46.0)
HCT: 36.4 % (ref 36.0–46.0)
Hemoglobin: 12.4 g/dL (ref 12.0–15.0)
MCH: 31.6 pg (ref 26.0–34.0)
MCH: 31.3 pg (ref 26.0–34.0)
MCV: 90.8 fL (ref 78.0–100.0)
RBC: 4.02 MIL/uL (ref 3.87–5.11)
RBC: 3.96 MIL/uL (ref 3.87–5.11)
WBC: 10.8 10*3/uL — ABNORMAL HIGH (ref 4.0–10.5)

## 2012-08-24 LAB — COMPREHENSIVE METABOLIC PANEL
BUN: 11 mg/dL (ref 6–23)
CO2: 21 mEq/L (ref 19–32)
Calcium: 8.6 mg/dL (ref 8.4–10.5)
Chloride: 102 mEq/L (ref 96–112)
Creatinine, Ser: 0.53 mg/dL (ref 0.50–1.10)
GFR calc Af Amer: >90 mL/min (ref >90)
GFR calc non Af Amer: >90 mL/min (ref >90)
Total Bilirubin: 0.2 mg/dL — ABNORMAL LOW (ref 0.3–1.2)

## 2012-08-24 LAB — RPR: RPR Ser Ql: NONREACTIVE

## 2012-08-24 MED ORDER — FLEET ENEMA 7-19 GM/118ML RE ENEM: 1.0000 | ENEMA | RECTAL | Status: DC | PRN

## 2012-08-24 MED ORDER — PROMETHAZINE HCL 25 MG/ML IJ SOLN
12.5000 mg | INTRAMUSCULAR | Status: DC | PRN
  Administered 2012-08-24: 12.5 mg via INTRAVENOUS
  Filled 2012-08-24: qty 1

## 2012-08-24 MED ORDER — OXYTOCIN BOLUS FROM INFUSION
500.0000 mL | INTRAVENOUS | Status: DC
  Administered 2012-08-25: 500 mL via INTRAVENOUS

## 2012-08-24 MED ORDER — OXYCODONE-ACETAMINOPHEN 5-325 MG PO TABS: 1.0000 | ORAL_TABLET | ORAL | Status: DC | PRN

## 2012-08-24 MED ORDER — HYDROMORPHONE HCL PF 1 MG/ML IJ SOLN
2.0000 mg | Freq: Once | INTRAMUSCULAR | Status: AC
  Administered 2012-08-24: 2 mg via INTRAVENOUS
  Filled 2012-08-24: qty 2

## 2012-08-24 MED ORDER — PHENYLEPHRINE 40 MCG/ML (10ML) SYRINGE FOR IV PUSH (FOR BLOOD PRESSURE SUPPORT)
80.0000 ug | PREFILLED_SYRINGE | INTRAVENOUS | Status: DC | PRN
  Filled 2012-08-24: qty 5

## 2012-08-24 MED ORDER — ACETAMINOPHEN 325 MG PO TABS: 650.0000 mg | ORAL_TABLET | ORAL | Status: DC | PRN

## 2012-08-24 MED ORDER — FENTANYL CITRATE 0.05 MG/ML IJ SOLN
100.0000 ug | Freq: Once | INTRAMUSCULAR | Status: AC
  Administered 2012-08-24: 100 ug via INTRAVENOUS
  Filled 2012-08-24: qty 2

## 2012-08-24 MED ORDER — LACTATED RINGERS IV SOLN
500.0000 mL | Freq: Once | INTRAVENOUS | Status: AC
  Administered 2012-08-24: 500 mL via INTRAVENOUS

## 2012-08-24 MED ORDER — MAGNESIUM SULFATE 40 G IN LACTATED RINGERS - SIMPLE
2.0000 g/h | INTRAVENOUS | Status: DC
  Filled 2012-08-24: qty 500

## 2012-08-24 MED ORDER — FENTANYL 2.5 MCG/ML BUPIVACAINE 1/10 % EPIDURAL INFUSION (WH - ANES)
14.0000 mL/h | INTRAMUSCULAR | Status: DC
  Administered 2012-08-24 – 2012-08-25 (×3): 14 mL/h via EPIDURAL
  Filled 2012-08-24 (×2): qty 125

## 2012-08-24 MED ORDER — FENTANYL CITRATE 0.05 MG/ML IJ SOLN: 50.0000 ug | INTRAMUSCULAR | Status: DC | PRN

## 2012-08-24 MED ORDER — LACTATED RINGERS IV SOLN
500.0000 mL | INTRAVENOUS | Status: DC | PRN
  Administered 2012-08-24 (×2): 500 mL via INTRAVENOUS

## 2012-08-24 MED ORDER — DIPHENHYDRAMINE HCL 50 MG/ML IJ SOLN
12.5000 mg | INTRAMUSCULAR | Status: DC | PRN
  Administered 2012-08-25: 12.5 mg via INTRAVENOUS
  Filled 2012-08-24: qty 1

## 2012-08-24 MED ORDER — CITRIC ACID-SODIUM CITRATE 334-500 MG/5ML PO SOLN: 30.0000 mL | ORAL | Status: DC | PRN

## 2012-08-24 MED ORDER — ONDANSETRON HCL 4 MG/2ML IJ SOLN: 4.0000 mg | Freq: Four times a day (QID) | INTRAMUSCULAR | Status: DC | PRN

## 2012-08-24 MED ORDER — MAGNESIUM SULFATE BOLUS VIA INFUSION
4.0000 g | Freq: Once | INTRAVENOUS | Status: AC
  Administered 2012-08-24: 4 g via INTRAVENOUS
  Filled 2012-08-24: qty 500

## 2012-08-24 MED ORDER — OXYTOCIN 40 UNITS IN LACTATED RINGERS INFUSION - SIMPLE MED: 62.5000 mL/h | INTRAVENOUS | Status: DC

## 2012-08-24 MED ORDER — LIDOCAINE HCL (PF) 1 % IJ SOLN
30.0000 mL | INTRAMUSCULAR | Status: DC | PRN
  Filled 2012-08-24: qty 30

## 2012-08-24 MED ORDER — LACTATED RINGERS IV SOLN
INTRAVENOUS | Status: DC
  Administered 2012-08-24: 250 mL/h via INTRAVENOUS
  Administered 2012-08-24 – 2012-08-25 (×3): via INTRAVENOUS

## 2012-08-24 MED ORDER — EPHEDRINE 5 MG/ML INJ: 10.0000 mg | INTRAVENOUS | Status: DC | PRN

## 2012-08-24 MED ORDER — PHENYLEPHRINE 40 MCG/ML (10ML) SYRINGE FOR IV PUSH (FOR BLOOD PRESSURE SUPPORT): 80.0000 ug | PREFILLED_SYRINGE | INTRAVENOUS | Status: DC | PRN

## 2012-08-24 MED ORDER — IBUPROFEN 600 MG PO TABS: 600.0000 mg | ORAL_TABLET | Freq: Four times a day (QID) | ORAL | Status: DC | PRN

## 2012-08-24 MED ORDER — LIDOCAINE HCL (PF) 1 % IJ SOLN
INTRAMUSCULAR | Status: DC | PRN
  Administered 2012-08-24: 2 mL
  Administered 2012-08-24 (×3): 4 mL

## 2012-08-24 MED ORDER — EPHEDRINE 5 MG/ML INJ
10.0000 mg | INTRAVENOUS | Status: DC | PRN
  Filled 2012-08-24: qty 4

## 2012-08-24 NOTE — Anesthesia Procedure Notes (Signed)
Epidural Patient location during procedure: OB Start time: 08/24/2012 11:46 PM  Staffing Performed by: anesthesiologist   Preanesthetic Checklist Completed: patient identified, site marked, surgical consent, pre-op evaluation, timeout performed, IV checked, risks and benefits discussed and monitors and equipment checked  Epidural Patient position: sitting Prep: site prepped and draped and DuraPrep Patient monitoring: continuous pulse ox and blood pressure Approach: midline Injection technique: LOR air  Needle:  Needle type: Tuohy  Needle gauge: 17 G Needle length: 9 cm and 9 Needle insertion depth: 6 cm Catheter type: closed end flexible Catheter size: 19 Gauge Catheter at skin depth: 11 cm Test dose: negative  Assessment Events: blood not aspirated, injection not painful, no injection resistance, negative IV test and no paresthesia  Additional Notes Discussed risk of headache, infection, bleeding, nerve injury and failed or incomplete block.  Patient voices understanding and wishes to proceed. Reason for block:procedure for pain

## 2012-08-24 NOTE — H&P (Signed)
I have seen and examined patient. She has mild preeclampsia per records and discussion with her OB based on some elevated blood pressures and elevated protein/creatinine ratio. Her cervical dilation has not changed, but she is 90% effaced and very uncomfortable, contracting every 2 to 3 minutes. We will admit her for presumed preterm labor, monitor blood pressure, get a urine protein/creatinine ratio and provide IV medications for pain control. Since she is only 35 weeks and pressures are normal to mild range, we will not induce or augment labor at this point and will wait to see if she progresses on her own. She will continue her basal insulin by pump with CBG q 2 hours. GBS done 5 weeks ago was negative. Will treat as negative. Rapid GBS done. NST is reassuring but not reactive.  Napoleon Form, MD

## 2012-08-24 NOTE — Progress Notes (Signed)
[name] is a 21 y.o. G2P0010 at [redacted]w[redacted]d by ultrasound admitted for Preterm labor and rule out preeclampsia   Subjective:   Objective: BP 148/93  Pulse 87  Temp 98.3 F (36.8 C) (Oral)  Resp 18  Ht 5\' 5"  (1.651 m)  Wt 164 lb (74.39 kg)  BMI 27.29 kg/m2  LMP 12/03/2011   Total I/O In: 180 [P.O.:180] Out: 50 [Urine:50]  FHT:  FHR: 140 bpm, variability: moderate,  accelerations:  Present,  decelerations:  Absent UC:   irregular, every 6-7 minutes SVE:   Dilation: 3.5 Effacement (%): 90 Station: -2 Exam by:: Dr. Penne Fox  Labs: Lab Results  Component Value Date   WBC 10.8* 08/24/2012   HGB 12.7 08/24/2012   HCT 36.5 08/24/2012   MCV 90.8 08/24/2012   PLT 266 08/24/2012   .   Assessment / Plan: 21 yo female G2P0010 at 35 weeks 2 days with preterm contractions and rule out preeclampsia  1-Pt voided 50 cc over 5 hours (first void before 24 hour urine began).  Pt has only had 4 ounces of fluid during this time period.  Will give fluid bolus and strict i/o.   2-monitor for signs of worsening preeclampsia 3-Oligohydramnios--add dopplers to [place] 4-LGA-Pt measuring 2 weeks ahead.    [name] H. 08/24/2012, 8:37 PM

## 2012-08-24 NOTE — Anesthesia Preprocedure Evaluation (Addendum)
Anesthesia Evaluation  Patient identified by MRN, date of birth, ID band Patient awake    Reviewed: Allergy & Precautions, H&P , NPO status , Patient's Chart, lab work & pertinent test results, reviewed documented beta blocker date and time   History of Anesthesia Complications Negative for: history of anesthetic complications  Airway Mallampati: II TM Distance: >3 FB Neck ROM: full    Dental  (+) Teeth Intact   Pulmonary neg pulmonary ROS,  breath sounds clear to auscultation        Cardiovascular hypertension (preeclampsia - on magnesium), Rhythm:regular Rate:Normal     Neuro/Psych PSYCHIATRIC DISORDERS (bipolar d/o) negative neurological ROS  negative psych ROS   GI/Hepatic negative GI ROS, Neg liver ROS,   Endo/Other  diabetes (insulin pump), Type 1, Insulin Dependent  Renal/GU oliguria  negative genitourinary   Musculoskeletal   Abdominal   Peds  Hematology negative hematology ROS (+)   Anesthesia Other Findings   Reproductive/Obstetrics (+) Pregnancy                       Anesthesia Physical Anesthesia Plan  ASA: III  Anesthesia Plan: Epidural   Post-op Pain Management:    Induction:   Airway Management Planned:   Additional Equipment:   Intra-op Plan:   Post-operative Plan:   Informed Consent: I have reviewed the patients History and Physical, chart, labs and discussed the procedure including the risks, benefits and alternatives for the proposed anesthesia with the patient or authorized representative who has indicated his/her understanding and acceptance.     Plan Discussed with:   Anesthesia Plan Comments:         Anesthesia Quick Evaluation

## 2012-08-24 NOTE — H&P (Signed)
[name] is a 21 y.o. female presenting for contractions.  These are reported as worse today than they were yesterday.  She was admitted for observation yesterday on 11/20 and discharged earlier today on 11/20 with no cervical change.  During her previous stay, she had preeclampsia labs drawn and had a normal CBC and CMP but a P/C ratio of >4.0.  Today she reports an ongoing mild h/a but denies epigastric pain or visual disburbances.  She reports good fetal movement, denies LOF, vaginal bleeding, vaginal itching/burning, urinary symptoms, h/a, dizziness, n/v, or fever/chills.    She manages her Type I DM with an insulin pump.  Maternal Medical History:  Reason for admission: Reason for admission: contractions.  Reason for Admission:   nauseaContractions: Onset was 13-24 hours ago.   Frequency: regular.   Duration is approximately 1 minute.   Perceived severity is strong.    Fetal activity: Perceived fetal activity is normal.   Last perceived fetal movement was within the past hour.    Prenatal complications: Pre-eclampsia.   Prenatal Complications - Diabetes: type 1. Diabetes is managed by insulin pump.      EFW by U/S at 30 weeks was 97th percentile  OB History    Grav Para Term Preterm Abortions TAB SAB Ect Mult Living   2 0 0 0 1 0 0 1 0 0      Past Medical History  Diagnosis Date  . 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 Corneal abrasion   . Candidiasis   . Tubal pregnancy   . Diabetes mellitus     dx age 6  . Diabetes mellitus type I   . Urinary tract infection   . Pregnancy induced hypertension    Past Surgical History  Procedure Date  . Wisdom tooth extraction 2012   Family History: family history includes Cancer in her mother and Hypertension in her mother.  There is no history of Anesthesia problems. Social History:  reports that she has quit smoking. Her smoking use included Cigarettes. She has a 1 pack-year smoking history. She has  never used smokeless tobacco. She reports that she does not drink alcohol or use illicit drugs.   Prenatal Transfer Tool  Maternal Diabetes: Yes:  Diabetes Type:  Pre-pregnancy, Insulin/Medication controlled Type I/Insulin Pump controlled Genetic Screening: Normal Maternal Ultrasounds/Referrals: Normal Fetal Ultrasounds or other Referrals:  Fetal echo normal Maternal Substance Abuse:  No Significant Maternal Medications:  None Significant Maternal Lab Results:  Lab values include: Group B Strep negative Other Comments:  None  Review of Systems  Constitutional: Negative for fever, chills and malaise/fatigue.  Eyes: Negative for blurred vision.  Respiratory: Negative for cough and shortness of breath.   Cardiovascular: Negative for chest pain.  Gastrointestinal: Positive for abdominal pain. Negative for heartburn, nausea and vomiting.  Genitourinary: Negative for dysuria, urgency and frequency.  Musculoskeletal: Negative.   Neurological: Positive for headaches. Negative for dizziness.  Psychiatric/Behavioral: Negative for depression.    Dilation: 3 Effacement (%): 90 Station: -1 Exam by:: Dr. Penne Lash Blood pressure 148/90, pulse 83, temperature 97.2 F (36.2 C), temperature source Oral, resp. rate 20, height 5\' 5"  (1.651 m), weight 74.39 kg (164 lb), last menstrual period 12/03/2011. Maternal Exam:  Uterine Assessment: Contraction strength is moderate.  Contraction duration is 70 seconds. Contraction frequency is regular.   Abdomen: Patient reports no abdominal tenderness. Fetal presentation: vertex  Cervix: Cervix evaluated by digital exam.     Fetal Exam Fetal Monitor  Review: Mode: ultrasound.   Baseline rate: 135.  Variability: moderate (6-25 bpm).   Pattern: accelerations present and no decelerations.    Fetal State Assessment: Category I - tracings are normal.     Physical Exam  Nursing note and vitals reviewed. Constitutional: She is oriented to person, place,  and time. She appears well-developed and well-nourished.  Neck: Normal range of motion.  Cardiovascular: Normal rate and regular rhythm.   Respiratory: Effort normal and breath sounds normal.  GI: Soft.  Musculoskeletal: Normal range of motion.  Neurological: She is alert and oriented to person, place, and time. She has normal reflexes.  Skin: Skin is warm and dry.  Psychiatric: She has a normal mood and affect. Her behavior is normal. Judgment and thought content normal.    Prenatal labs: ABO, Rh: --/--/O POS (04/24 1600) Antibody: Negative (05/02 0000) Rubella: 7.1 (04/24 1600) RPR: NON REACTIVE (11/20 1207)  HBsAg: NEGATIVE (04/24 1600)  HIV: Non-reactive (05/02 0000)  GBS: Negative (10/03 0000)   Assessment/Plan: Preterm contractions/Threatened preterm labor Preeclampsia GBS negative  Admit for observation to birthing suites  CBC CMP IV medication for pain/therapeutic rest U/S for growth Expectant management   LEFTWICH-KIRBY, Jearl Soto 08/24/2012, 5:47 PM

## 2012-08-24 NOTE — MAU Note (Signed)
C/o ucs since yesterday around 0430;

## 2012-08-25 ENCOUNTER — Encounter (HOSPITAL_COMMUNITY): Payer: Self-pay | Admitting: *Deleted

## 2012-08-25 DIAGNOSIS — O2432 Unspecified pre-existing diabetes mellitus in childbirth: Secondary | ICD-10-CM

## 2012-08-25 DIAGNOSIS — E109 Type 1 diabetes mellitus without complications: Secondary | ICD-10-CM

## 2012-08-25 DIAGNOSIS — IMO0002 Reserved for concepts with insufficient information to code with codable children: Secondary | ICD-10-CM

## 2012-08-25 LAB — COMPREHENSIVE METABOLIC PANEL
ALT: 8 U/L (ref 0–35)
CO2: 19 mEq/L (ref 19–32)
Calcium: 7.8 mg/dL — ABNORMAL LOW (ref 8.4–10.5)
GFR calc Af Amer: >90 mL/min (ref >90)
GFR calc non Af Amer: >90 mL/min (ref >90)
Glucose, Bld: 263 mg/dL — ABNORMAL HIGH (ref 70–99)
Sodium: 128 mEq/L — ABNORMAL LOW (ref 135–145)

## 2012-08-25 LAB — URINALYSIS, ROUTINE W REFLEX MICROSCOPIC
Glucose, UA: NEGATIVE mg/dL
Ketones, ur: 15 mg/dL — AB
Protein, ur: 30 mg/dL — AB

## 2012-08-25 LAB — GLUCOSE, CAPILLARY
Glucose-Capillary: 88 mg/dL (ref 70–99)
Glucose-Capillary: 184 mg/dL — ABNORMAL HIGH (ref 70–99)

## 2012-08-25 LAB — CBC
Hemoglobin: 13.4 g/dL (ref 12.0–15.0)
RBC: 4.2 MIL/uL (ref 3.87–5.11)
WBC: 16.3 10*3/uL — ABNORMAL HIGH (ref 4.0–10.5)

## 2012-08-25 LAB — URINE MICROSCOPIC-ADD ON

## 2012-08-25 LAB — MRSA PCR SCREENING: MRSA by PCR: NEGATIVE

## 2012-08-25 MED ORDER — WITCH HAZEL-GLYCERIN EX PADS: 1.0000 "application " | MEDICATED_PAD | CUTANEOUS | Status: DC | PRN

## 2012-08-25 MED ORDER — TERBUTALINE SULFATE 1 MG/ML IJ SOLN: 0.2500 mg | Freq: Once | INTRAMUSCULAR | Status: DC | PRN

## 2012-08-25 MED ORDER — MISOPROSTOL 200 MCG PO TABS
ORAL_TABLET | ORAL | Status: AC
  Administered 2012-08-25: 800 ug via VAGINAL
  Filled 2012-08-25: qty 5

## 2012-08-25 MED ORDER — DIBUCAINE 1 % RE OINT: 1.0000 "application " | TOPICAL_OINTMENT | RECTAL | Status: DC | PRN

## 2012-08-25 MED ORDER — DIPHENHYDRAMINE HCL 25 MG PO CAPS: 25.0000 mg | ORAL_CAPSULE | Freq: Four times a day (QID) | ORAL | Status: DC | PRN

## 2012-08-25 MED ORDER — OXYCODONE-ACETAMINOPHEN 5-325 MG PO TABS: 1.0000 | ORAL_TABLET | ORAL | Status: DC | PRN

## 2012-08-25 MED ORDER — BENZOCAINE-MENTHOL 20-0.5 % EX AERO
1.0000 "application " | INHALATION_SPRAY | CUTANEOUS | Status: DC | PRN
  Filled 2012-08-25: qty 56

## 2012-08-25 MED ORDER — PRENATAL MULTIVITAMIN CH
1.0000 | ORAL_TABLET | Freq: Every day | ORAL | Status: DC
  Administered 2012-08-26 – 2012-08-27 (×2): 1 via ORAL
  Filled 2012-08-25 (×2): qty 1

## 2012-08-25 MED ORDER — IBUPROFEN 600 MG PO TABS
600.0000 mg | ORAL_TABLET | Freq: Four times a day (QID) | ORAL | Status: DC
  Administered 2012-08-25 – 2012-08-27 (×7): 600 mg via ORAL
  Filled 2012-08-25 (×7): qty 1

## 2012-08-25 MED ORDER — ZOLPIDEM TARTRATE 5 MG PO TABS: 5.0000 mg | ORAL_TABLET | Freq: Every evening | ORAL | Status: DC | PRN

## 2012-08-25 MED ORDER — TETANUS-DIPHTH-ACELL PERTUSSIS 5-2.5-18.5 LF-MCG/0.5 IM SUSP
0.5000 mL | Freq: Once | INTRAMUSCULAR | Status: AC
  Administered 2012-08-27: 0.5 mL via INTRAMUSCULAR
  Filled 2012-08-25 (×2): qty 0.5

## 2012-08-25 MED ORDER — ONDANSETRON HCL 4 MG/2ML IJ SOLN: 4.0000 mg | INTRAMUSCULAR | Status: DC | PRN

## 2012-08-25 MED ORDER — OXYTOCIN 40 UNITS IN LACTATED RINGERS INFUSION - SIMPLE MED: 1.0000 m[IU]/min | INTRAVENOUS | Status: DC

## 2012-08-25 MED ORDER — SENNOSIDES-DOCUSATE SODIUM 8.6-50 MG PO TABS
2.0000 | ORAL_TABLET | Freq: Every day | ORAL | Status: DC
  Administered 2012-08-25 – 2012-08-26 (×2): 2 via ORAL

## 2012-08-25 MED ORDER — MISOPROSTOL 200 MCG PO TABS
800.0000 ug | ORAL_TABLET | Freq: Once | ORAL | Status: AC
  Administered 2012-08-25: 800 ug via VAGINAL

## 2012-08-25 MED ORDER — MAGNESIUM SULFATE 40 G IN LACTATED RINGERS - SIMPLE
2.0000 g/h | INTRAVENOUS | Status: DC
  Administered 2012-08-25: 2 g/h via INTRAVENOUS
  Filled 2012-08-25 (×2): qty 500

## 2012-08-25 MED ORDER — OXYTOCIN 40 UNITS IN LACTATED RINGERS INFUSION - SIMPLE MED
1.0000 m[IU]/min | INTRAVENOUS | Status: DC
  Administered 2012-08-25: 1 m[IU]/min via INTRAVENOUS
  Filled 2012-08-25: qty 1000

## 2012-08-25 MED ORDER — ONDANSETRON HCL 4 MG PO TABS: 4.0000 mg | ORAL_TABLET | ORAL | Status: DC | PRN

## 2012-08-25 MED ORDER — SIMETHICONE 80 MG PO CHEW: 80.0000 mg | CHEWABLE_TABLET | ORAL | Status: DC | PRN

## 2012-08-25 MED ORDER — LACTATED RINGERS IV SOLN
INTRAVENOUS | Status: DC
  Administered 2012-08-25 – 2012-08-26 (×2): via INTRAVENOUS

## 2012-08-25 MED ORDER — LANOLIN HYDROUS EX OINT: TOPICAL_OINTMENT | CUTANEOUS | Status: DC | PRN

## 2012-08-25 NOTE — Progress Notes (Signed)
LATE ENTRY OF NOTE (pt seen at 0930)  [name] is a 21 y.o. G2P0010 at [redacted]w[redacted]d  Subjective: Comfortable on epidural  Objective: BP 130/83  Pulse 127  Temp 98.3 F (36.8 C) (Oral)  Resp 18  Ht 5\' 5"  (1.651 m)  Wt 74.39 kg (164 lb)  BMI 27.29 kg/m2  LMP 12/03/2011 I/O last 3 completed shifts: In: 3912.7 [P.O.:420; I.V.:3492.7] Out: 630 [Urine:630] Total I/O In: 564.6 [P.O.:180; I.V.:384.6] Out: 100 [Urine:100]  FHT:  FHR: 140 bpm, variability: moderate,  accelerations:  Present,  decelerations:  Absent UC:   irregular, every 3-5 minutes SVE:   Dilation: 5 Effacement (%): 90 Station: 0 Exam by:: Dr. Thad Ranger  Labs: Lab Results  Component Value Date   WBC 11.8* 08/24/2012   HGB 12.4 08/24/2012   HCT 36.4 08/24/2012   MCV 91.9 08/24/2012   PLT 249 08/24/2012    Assessment / Plan: Spontaneous labor, progressing normally  Labor: Progressing on Pitocin, IUPC placed Preeclampsia:  on magnesium sulfate Fetal Wellbeing:  Category I Pain Control:  Epidural Anticipated MOD:  NSVD  Faizon Capozzi, Cristal Deer 08/25/2012, 10:43 AM

## 2012-08-25 NOTE — Progress Notes (Signed)
CBG - 184 and pt self administered 14.7u via pump

## 2012-08-25 NOTE — Progress Notes (Signed)
Patient ID: [name], female   DOB: [birth year], 21 y.o.   MRN: [id number]  Comfortable with epidural.  AVSS Filed Vitals:   08/25/12 0330 08/25/12 0400 08/25/12 0430 08/25/12 0500  BP: 127/71 129/72 129/77 126/75  Pulse: 116 107 96 105  Temp:  98.2 F (36.8 C)    TempSrc:  Axillary    Resp: 18 18 18 18   Height:      Weight:        FHR stable and reactive UCs still only every 4 min  Will increase Pitocin order to 2x2.

## 2012-08-25 NOTE — Consult Note (Signed)
Neonatology Note:  Attendance at Code Apgar:  Our team responded to a Code Apgar call to room # 166 following NSVD, due to infant with apnea. GA is 35 weeks. The mother is a G2P0A1 O pos, GBS neg with Type 1 IDDM and PIH, on magnesium sulfate. ROM occurred 8 hours PTD and the fluid was clear. At delivery, the baby was apneic, floppy, and glassy-eyed. The OB nursing staff in attendance gave vigorous stimulation and a Code Apgar was called. Our team arrived at 1 minute of life, at which time the baby was blue, apneic, flaccid, and had a HR of 60. We bulb suctioned and applied PPV for about 1.5 min, with improvement in HR, color, and finally, with baby starting to breathe at 2.5 minutes. Her muscle tone improved gradually and was normal by 10 minutes. Her lungs were clear with good air exchange and no distress. She had some bruising and there may be some minimal weakness at the right shoulder compared to the left. Ap 1/7/9. I spoke with the mother in the DR, am allowing baby to stay in LDR for 10-15 minutes, then should go to CN for observation. She is transferred to the Pediatrician's care.  Louna Rothgeb C. Lamere Lightner, MD  

## 2012-08-25 NOTE — Progress Notes (Signed)
[name] is a 21 y.o. G2P0010 at [redacted]w[redacted]d   Subjective: Comfortable on epidural, some right low-back pain, intermittent BM-like pressure  Objective: BP 117/69  Pulse 92  Temp 98.6 F (37 C) (Oral)  Resp 18  Ht 5\' 5"  (1.651 m)  Wt 74.39 kg (164 lb)  BMI 27.29 kg/m2  LMP 12/03/2011 I/O last 3 completed shifts: In: 3912.7 [P.O.:420; I.V.:3492.7] Out: 630 [Urine:630] Total I/O In: 564.6 [P.O.:180; I.V.:384.6] Out: 200 [Urine:200]  FHT:  FHR: 150 bpm, variability: moderate,  accelerations:  Present,  decelerations:  Absent UC:   regular, every 3-5 minutes SVE:   Dilation: 10 Effacement (%): 100 Station: 0 Exam by:: Patricia Sarna, RN  Labs: Lab Results  Component Value Date   WBC 11.8* 08/24/2012   HGB 12.4 08/24/2012   HCT 36.4 08/24/2012   MCV 91.9 08/24/2012   PLT 249 08/24/2012    Assessment / Plan: Augmentation of labor, progressing well  Labor: Progressing on Pitocin, complete but not yet pushing; IUPC placed around 0930 Preeclampsia:  on magnesium sulfate and no signs or symptoms of toxicity; remains oliguric with Foley in place Fetal Wellbeing:  Category I Pain Control:  Epidural Anticipated MOD:  NSVD  [name], [name] 08/25/2012, 12:27 PM

## 2012-08-25 NOTE — Progress Notes (Addendum)
[name] is a 21 y.o. G2P0010 at [redacted]w[redacted]d by ultrasound admitted for preeclampsia and early / active labor.  Also new onset oliguria  Subjective: Denies headache or abdominal pain  Objective: BP 127/64  Pulse 115  Temp 98.3 F (36.8 C) (Axillary)  Resp 18  Ht 5\' 5"  (1.651 m)  Wt 164 lb (74.39 kg)  BMI 27.29 kg/m2  LMP 12/03/2011   Total I/O In: 3101.3 [P.O.:360; I.V.:2741.3] Out: 450 [Urine:450]  FHT:  FHR: 140 bpm, variability: moderate,  accelerations:  Present,  decelerations:  Absent UC:   irregular, every 3-5 min minutes SVE:   Dilation: 4.5 Effacement (%): 80 Station: -1 Exam by:: GPayne, RN  Labs: Lab Results  Component Value Date   WBC 11.8* 08/24/2012   HGB 12.4 08/24/2012   HCT 36.4 08/24/2012   MCV 91.9 08/24/2012   PLT 249 08/24/2012    Assessment / Plan: SIUP at [redacted]w[redacted]d  Preeclampsia Labor, slow progress due to hypotonic uterine dysfunction Urine output much improved  Labor: Slow progress, irregular UC pattern  Will augment with Pitocin Preeclampsia:  on magnesium sulfate and no signs or symptoms of toxicity Fetal Wellbeing:  Category I Pain Control:  Epidural I/D:  n/a Anticipated MOD:  NSVD  WILLIAMS,MARIE 08/25/2012, 3:24 AM

## 2012-08-25 NOTE — Progress Notes (Signed)
Patient ID: [name], female   DOB: 12-21-1990, 21 y.o.   MRN: [id number]  S:  Patient was admitted for observation.  Denied headache or abdominal pain.        Reports painful contractions.  O:  BPs 130-140s / 85-mid 90s      EFM:  Reactive FHR                Contractions every 3-6 min       Cervix 4cm       Results for orders placed during the hospital encounter of 08/24/12 (from the past 24 hour(s))  CBC     Status: Abnormal   Collection Time   08/24/12  4:19 PM      Component Value Range   WBC 10.8 (*) 4.0 - 10.5 K/uL   RBC 4.02  3.87 - 5.11 MIL/uL   Hemoglobin 12.7  12.0 - 15.0 g/dL   HCT 78.2  95.6 - 21.3 %   MCV 90.8  78.0 - 100.0 fL   MCH 31.6  26.0 - 34.0 pg   MCHC 34.8  30.0 - 36.0 g/dL   RDW 08.6  57.8 - 46.9 %   Platelets 266  150 - 400 K/uL  COMPREHENSIVE METABOLIC PANEL     Status: Abnormal   Collection Time   08/24/12  4:19 PM      Component Value Range   Sodium 135  135 - 145 mEq/L   Potassium 3.8  3.5 - 5.1 mEq/L   Chloride 102  96 - 112 mEq/L   CO2 21  19 - 32 mEq/L   Glucose, Bld 102 (*) 70 - 99 mg/dL   BUN 11  6 - 23 mg/dL   Creatinine, Ser 6.29  0.50 - 1.10 mg/dL   Calcium 8.6  8.4 - 52.8 mg/dL   Total Protein 5.5 (*) 6.0 - 8.3 g/dL   Albumin 2.2 (*) 3.5 - 5.2 g/dL   AST 15  0 - 37 U/L   ALT 7  0 - 35 U/L   Alkaline Phosphatase 117  39 - 117 U/L   Total Bilirubin 0.2 (*) 0.3 - 1.2 mg/dL   GFR calc non Af Amer >90  >90 mL/min   GFR calc Af Amer >90  >90 mL/min  RPR     Status: Normal   Collection Time   08/24/12  6:20 PM      Component Value Range   RPR NON REACTIVE  NON REACTIVE  GLUCOSE, CAPILLARY     Status: Abnormal   Collection Time   08/24/12  6:58 PM      Component Value Range   Glucose-Capillary 114 (*) 70 - 99 mg/dL   Comment 1 Documented in Chart    CBC     Status: Abnormal   Collection Time   08/24/12 10:55 PM      Component Value Range   WBC 11.8 (*) 4.0 - 10.5 K/uL   RBC 3.96  3.87 - 5.11 MIL/uL   Hemoglobin 12.4   12.0 - 15.0 g/dL   HCT 41.3  24.4 - 01.0 %   MCV 91.9  78.0 - 100.0 fL   MCH 31.3  26.0 - 34.0 pg   MCHC 34.1  30.0 - 36.0 g/dL   RDW 27.2  53.6 - 64.4 %   Platelets 249  150 - 400 K/uL  GLUCOSE, CAPILLARY     Status: Abnormal   Collection Time   08/24/12 11:09 PM  Component Value Range   Glucose-Capillary 122 (*) 70 - 99 mg/dL  GLUCOSE, CAPILLARY     Status: Abnormal   Collection Time   08/25/12  3:10 AM      Component Value Range   Glucose-Capillary 128 (*) 70 - 99 mg/dL   Of note, Urine output was only 50 cc for over 4 hours  A:  SIUP at [redacted]w[redacted]d       Preeclampsia with oliguria, atypical features      Early active labor  P:  Magnesium sulfate per Dr Penne Lash consultation       Observe labor status and get epidural

## 2012-08-26 LAB — CBC
Hemoglobin: 11 g/dL — ABNORMAL LOW (ref 12.0–15.0)
MCHC: 34 g/dL (ref 30.0–36.0)
Platelets: 205 10*3/uL (ref 150–400)
RDW: 12.8 % (ref 11.5–15.5)

## 2012-08-26 LAB — GLUCOSE, CAPILLARY
Glucose-Capillary: 116 mg/dL — ABNORMAL HIGH (ref 70–99)
Glucose-Capillary: 59 mg/dL — ABNORMAL LOW (ref 70–99)

## 2012-08-26 MED ORDER — GUAIFENESIN ER 600 MG PO TB12
600.0000 mg | ORAL_TABLET | Freq: Two times a day (BID) | ORAL | Status: DC | PRN
  Administered 2012-08-26: 600 mg via ORAL
  Filled 2012-08-26: qty 1

## 2012-08-26 MED ORDER — DEXTROMETHORPHAN POLISTIREX 30 MG/5ML PO LQCR
10.0000 mL | Freq: Two times a day (BID) | ORAL | Status: DC | PRN
  Filled 2012-08-26: qty 10
  Filled 2012-08-26: qty 5

## 2012-08-26 NOTE — Progress Notes (Addendum)
Pt. Sleeping. BP cuff and O2 sat monitor off

## 2012-08-26 NOTE — Progress Notes (Signed)
Post Partum Day 1 Subjective: no complaints and tolerating PO  Objective: Blood pressure 114/63, pulse 88, temperature 97.5 F (36.4 C), temperature source Oral, resp. rate 16, height 5\' 4"  (1.626 m), weight 167 lb 1.6 oz (75.796 kg), last menstrual period 12/03/2011, SpO2 96.00%, unknown if currently breastfeeding.  Physical Exam:  General: alert and cooperative Lochia: appropriate Uterine Fundus: firm Incision: n/a  DVT Evaluation: No evidence of DVT seen on physical exam.   Basename 08/26/12 0500 08/25/12 1534  HGB 11.0* 13.4  HCT 32.4* 38.7    Assessment/Plan: Preeclampsia, Type 1 DM. Continue BG monitoring and insulin pump. Magnesium until 24 hr pp   LOS: 2 days   ARNOLD,JAMES 08/26/2012, 9:34 AM

## 2012-08-26 NOTE — Progress Notes (Signed)
This note also relates to the following rows which could not be included: CBG Lab Component - View only - Cannot attach notes to extension rows   15u insulin by pt. Via her pump

## 2012-08-26 NOTE — Progress Notes (Addendum)
Hypoglycemic Event  CBG: 34    Treatment: 15 GM carbohydrate snack  Symptoms: Pale  Follow-up CBG: Time:0930 CBG Result:60  Possible Reasons for Event: Unknown  Comments/MD notified: Dr. Debroah Loop making rounds and is aware. Will continue to follow with Hypoglycemic Protocol.    [name]  CBG=90 at [phone]. Pt has ordered breakfast. No further treatment needed.  Remember to initiate Hypoglycemia Order Set & complete

## 2012-08-26 NOTE — Anesthesia Postprocedure Evaluation (Signed)
  Anesthesia Post-op Note  Patient: [name]  Procedure(s) Performed: * No procedures listed *  Patient Location: ICU  Anesthesia Type:Epidural  Level of Consciousness: awake, alert  and oriented  Airway and Oxygen Therapy: Patient Spontanous Breathing  Post-op Pain: none  Post-op Assessment: Post-op Vital signs reviewed, Patient's Cardiovascular Status Stable, No headache, No backache, No residual numbness and No residual motor weakness  Post-op Vital Signs: Reviewed and stable  Complications: No apparent anesthesia complications

## 2012-08-26 NOTE — Progress Notes (Addendum)
CBG= 59; patient getting ready to eat her dinner.  Encouraged patient to eat dinner same time each time and not this                   Late.  Will recheck CBG .       2330 CBG= 53  Asymptomatic.  Alert and oriented.  Graham crackers with peanut butter and ginger ale given                  (refused sprite)        0100  CBG= 57  Still eating her snacks.

## 2012-08-27 LAB — GLUCOSE, CAPILLARY
Glucose-Capillary: 57 mg/dL — ABNORMAL LOW (ref 70–99)
Glucose-Capillary: 62 mg/dL — ABNORMAL LOW (ref 70–99)

## 2012-08-27 LAB — URINE CULTURE
Colony Count: NO GROWTH
Culture: NO GROWTH

## 2012-08-27 NOTE — Progress Notes (Signed)
Discharge teaching provided to patient and family at bedside.  No questions at this time.  Patient discharged home in stable condition.  Left unit accompanied by staff with personal belongings.  Osvaldo Angst, RN-------------

## 2012-08-27 NOTE — Clinical Social Work Note (Signed)
Clinical Social Work Department PSYCHOSOCIAL ASSESSMENT - MATERNAL/CHILD 08/27/2012  Patient:  [name]  Account Number:  [id number]  Admit Date:  08/24/2012  Marjo Bicker Name:   Louretta Shorten    Clinical Social Worker:  Truman Hayward, LCSW   Date/Time:  08/27/2012 04:00 PM  Date Referred:  08/27/2012   Referral source  Physician     Referred reason  NICU   Other referral source:    I:  FAMILY / HOME ENVIRONMENT Child's legal guardian:  PARENT  Guardian - Name Guardian - Age Guardian - Address  Ninoska Goswick [address] [place], [place] [address]   Other household support members/support persons Name Relationship DOB  Cindy [name] GRAND MOTHER   Wynelle Bourgeois GRANDFATHER    Other support:    II  PSYCHOSOCIAL DATA Information Source:  Patient Interview  Event organiser Employment:   Surveyor, quantity resources:  [employer] If Medicaid - County:  [place] Other  WIC   School / Grade:   Maternity Care Coordinator / Child Services Coordination / Early Interventions:  Cultural issues impacting care:    III  STRENGTHS Strengths  Adequate Resources  Home prepared for Child (including basic supplies)  Supportive family/friends  Understanding of illness  Compliance with medical plan   Strength comment:    IV  RISK FACTORS AND CURRENT PROBLEMS Current Problem:  None   Risk Factor & Current Problem Patient Issue Family Issue Risk Factor / Current Problem Comment   N N     V  SOCIAL WORK ASSESSMENT CSW spoke with MOB in NICU at bedside of infant.  CSW introduced SW department and provided education on SW role in NICU as MOB was about to leave for discharge.  CSW was not able to discuss in detail emotional stability and FOB involvement due to being in NICU and several other families being around.  CSW will meet with MOB again to discuss if any concerns.  MOB currently lives with her parents and have good family support.  MOB expressed going to  [facility] at DSS this week to discuss assistance.  CSW will continue to follow while infant is in NICU.      VI SOCIAL WORK PLAN Social Work Plan  Psychosocial Support/Ongoing Assessment of Needs   Type of pt/family education:   If child protective services report - county:   If child protective services report - date:   Information/referral to community resources comment:   Other social work plan:

## 2012-08-27 NOTE — Discharge Summary (Signed)
Obstetric Discharge Summary Reason for Admission: observation/evaluation and preeclampsia Prenatal Procedures: Preeclampsia Intrapartum Procedures: spontaneous vaginal delivery Postpartum Procedures: none Complications-Operative and Postpartum: none Hemoglobin  Date Value Range Status  08/26/2012 11.0* 12.0 - 15.0 g/dL Final     REPEATED TO VERIFY     DELTA CHECK NOTED     HCT  Date Value Range Status  08/26/2012 32.4* 36.0 - 46.0 % Final    Physical Exam:  General: alert, cooperative and no distress Lochia: appropriate Uterine Fundus: firm Incision: none DVT Evaluation: No evidence of DVT seen on physical exam.  Discharge Diagnoses: Preelampsia and premature delivery, class d diabetes, type 1  Discharge Information: Date: 08/27/2012 Activity: pelvic rest Diet: diabetic Medications: insulin pump Condition: improved Instructions: postpartum care Discharge to: home Follow-up Information    Follow up with Lazaro Arms, MD. In 6 weeks.   Contact information:   [address] [place] [address] [phone]          Newborn Data: Live born female  Birth Weight: 7 lb 2.5 oz (3245 g) APGAR: 1, 7  Baby in NICU  Ancel Easler 08/27/2012, 7:48 AM

## 2012-09-04 NOTE — H&P (Signed)
Agree with above note.  LEGGETT,KELLY H. 09/04/2012 9:24 PM  

## 2012-10-02 ENCOUNTER — Encounter (HOSPITAL_COMMUNITY): Payer: Self-pay | Admitting: *Deleted

## 2012-10-02 ENCOUNTER — Emergency Department (HOSPITAL_COMMUNITY)
Admission: EM | Admit: 2012-10-02 | Discharge: 2012-10-02 | Disposition: A | Discharge status: Home / Self Care | Payer: PRIVATE HEALTH INSURANCE | Attending: Emergency Medicine | Admitting: Emergency Medicine

## 2012-10-02 DIAGNOSIS — R112 Nausea with vomiting, unspecified: Secondary | ICD-10-CM | POA: Insufficient documentation

## 2012-10-02 DIAGNOSIS — Z8619 Personal history of other infectious and parasitic diseases: Secondary | ICD-10-CM | POA: Insufficient documentation

## 2012-10-02 DIAGNOSIS — Z3202 Encounter for pregnancy test, result negative: Secondary | ICD-10-CM | POA: Insufficient documentation

## 2012-10-02 DIAGNOSIS — E109 Type 1 diabetes mellitus without complications: Secondary | ICD-10-CM | POA: Insufficient documentation

## 2012-10-02 DIAGNOSIS — Z87891 Personal history of nicotine dependence: Secondary | ICD-10-CM | POA: Insufficient documentation

## 2012-10-02 DIAGNOSIS — R197 Diarrhea, unspecified: Secondary | ICD-10-CM | POA: Insufficient documentation

## 2012-10-02 DIAGNOSIS — E162 Hypoglycemia, unspecified: Secondary | ICD-10-CM

## 2012-10-02 DIAGNOSIS — Z87440 Personal history of urinary (tract) infections: Secondary | ICD-10-CM | POA: Insufficient documentation

## 2012-10-02 DIAGNOSIS — R109 Unspecified abdominal pain: Secondary | ICD-10-CM | POA: Insufficient documentation

## 2012-10-02 DIAGNOSIS — Z794 Long term (current) use of insulin: Secondary | ICD-10-CM | POA: Insufficient documentation

## 2012-10-02 LAB — BASIC METABOLIC PANEL
BUN: 13 mg/dL (ref 6–23)
Chloride: 95 mEq/L — ABNORMAL LOW (ref 96–112)
Creatinine, Ser: 0.59 mg/dL (ref 0.50–1.10)
GFR calc Af Amer: >90 mL/min (ref >90)
Glucose, Bld: 211 mg/dL — ABNORMAL HIGH (ref 70–99)

## 2012-10-02 LAB — URINALYSIS, ROUTINE W REFLEX MICROSCOPIC
Bilirubin Urine: NEGATIVE
Glucose, UA: NEGATIVE mg/dL
Hgb urine dipstick: NEGATIVE
Specific Gravity, Urine: 1.01 (ref 1.005–1.030)
Urobilinogen, UA: 0.2 mg/dL (ref 0.0–1.0)

## 2012-10-02 LAB — CBC
HCT: 39.9 % (ref 36.0–46.0)
Hemoglobin: 13.6 g/dL (ref 12.0–15.0)
MCHC: 34.1 g/dL (ref 30.0–36.0)
MCV: 90.9 fL (ref 78.0–100.0)
RDW: 13.1 % (ref 11.5–15.5)

## 2012-10-02 LAB — GLUCOSE, CAPILLARY: Glucose-Capillary: 183 mg/dL — ABNORMAL HIGH (ref 70–99)

## 2012-10-02 MED ORDER — ONDANSETRON HCL 4 MG/2ML IJ SOLN
4.0000 mg | Freq: Once | INTRAMUSCULAR | Status: AC
  Administered 2012-10-02: 4 mg via INTRAVENOUS
  Filled 2012-10-02: qty 2

## 2012-10-02 MED ORDER — ONDANSETRON 8 MG PO TBDP: 8.0000 mg | ORAL_TABLET | Freq: Three times a day (TID) | ORAL | Status: DC | PRN

## 2012-10-02 NOTE — ED Provider Notes (Signed)
History  This chart was scribed for Patricia Co, MD by Ardeen Jourdain, ED Scribe. This patient was seen in room APA07/APA07 and the patient's care was started at 2006.  CSN: [id number]  Arrival date & time 10/02/12  1956   First MD Initiated Contact with Patient 10/02/12 2006      Chief Complaint  Patient presents with  . Hypoglycemia     The history is provided by the patient. No language interpreter was used.   [name] is a 21 y.o. female brought in by ambulance, who presents to the Emergency Department complaining of hypoglycemia with associated abdominal pain, nausea, emesis and diarrhea. She states she was seen this morning by her PCP and her blood sugar was measured at 48 and after drinking some juice it continued to drop to 41.She states the nausea and emesis started 3 days ago and have caused her to lose her appetite. She states she did not change her basal rate during that time. She reports she has not had a regular menstrual period since her last baby was born over 1 month ago.   Past Medical History  Diagnosis Date  . 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 Corneal abrasion   . Candidiasis   . Tubal pregnancy   . Diabetes mellitus     dx age 9  . Diabetes mellitus type I   . Urinary tract infection   . Pregnancy induced hypertension     Past Surgical History  Procedure Date  . Wisdom tooth extraction 2012    Family History  Problem Relation Age of Onset  . Hypertension Mother   . Cancer Mother   . Anesthesia problems Neg Hx     History  Substance Use Topics  . Smoking status: Former Smoker -- 0.5 packs/day for 2 years    Types: Cigarettes  . Smokeless tobacco: Never Used     Comment: quit with + preg  . Alcohol Use: No    OB History    Grav Para Term Preterm Abortions TAB SAB Ect Mult Living   2 1 0 1 1 0 0 1 0 1       Review of Systems  All other systems reviewed and are negative.   A complete 10 system review  of systems was obtained and all systems are negative except as noted in the HPI and PMH.    Allergies  Review of patient's allergies indicates no known allergies.  Home Medications   Current Outpatient Rx  Name  Route  Sig  Dispense  Refill  . BENADRYL PO   Oral   Take 2 tablets by mouth daily as needed. For pain, to help sleep         . INSULIN PUMP   Subcutaneous   Inject 1 each into the skin continuous. NovoLog Insulin 1 unit for every 7 grams of carbs           Triage Vitals: BP 109/70  Pulse 98  Temp 98.4 F (36.9 C) (Oral)  Resp 18  Ht 5\' 5"  (1.651 m)  Wt 140 lb (63.504 kg)  BMI 23.30 kg/m2  SpO2 98%  Breastfeeding? No  Physical Exam  Nursing note and vitals reviewed. Constitutional: She is oriented to person, place, and time. She appears well-developed and well-nourished. No distress.  HENT:  Head: Normocephalic and atraumatic.  Eyes: EOM are normal.  Neck: Normal range of motion.  Cardiovascular: Normal rate, regular rhythm and normal  heart sounds.   Pulmonary/Chest: Effort normal and breath sounds normal.  Abdominal: Soft. She exhibits no distension. There is no tenderness.  Musculoskeletal: Normal range of motion.  Neurological: She is alert and oriented to person, place, and time.  Skin: Skin is warm and dry.  Psychiatric: She has a normal mood and affect. Judgment normal.    ED Course  Procedures (including critical care time)  DIAGNOSTIC STUDIES: Oxygen Saturation is 98% on room air, normal by my interpretation.    COORDINATION OF CARE:  8:08 [id number] treatment plan which includes blood work and nausea medication with pt at bedside and pt agreed to plan.  10:49 PM: Pt rechecked, she seems normal and comfortable, discharge was discussed   Labs Reviewed  BASIC METABOLIC PANEL - Abnormal; Notable for the following:    Sodium 133 (*)     Potassium 3.1 (*)     Chloride 95 (*)     Glucose, Bld 211 (*)     All other components within  normal limits  GLUCOSE, CAPILLARY - Abnormal; Notable for the following:    Glucose-Capillary 183 (*)     All other components within normal limits  CBC  PREGNANCY, URINE  URINALYSIS, ROUTINE W REFLEX MICROSCOPIC   No results found.   1. Hypoglycemia       MDM  Nausea vomiting is likely viral in nature.  Labs and urine pregnancy are normal.  She was hypoglycemic because she did not stop her basal rate and was not tolerating oral fluids today.  She understands that this is the cause of her hypoglycemia.  She will follow up closely with her primary care physician.  Home with antinausea medicine.  Both patient and mother are agreeable to outpatient plan.      I personally performed the services described in this documentation, which was scribed in my presence. The recorded information has been reviewed and is accurate.      Patricia Co, MD 10/03/12 [phone]

## 2012-10-02 NOTE — ED Notes (Signed)
Pt to department via EMS from doctor's office.  Pt saw doctor today for c/o n/v and abdominal pain since yesterday.  Per report, pt does have a history of IDDM and has a novalog pump.  Pt's blood sugar at physician's was in 40s.  Pt was given juice, insulin pump turned off and IV started.  Per EMS, blood sugar prior to arrival was 198.

## 2012-10-02 NOTE — ED Notes (Signed)
Pt seen at primary care physician for n/v and abd pain. CBG at office was in 40s and they called ems. Pt given juice and started on d5w with EMS. Pt 190 per EMS. Pt is IDDM and on an insulin pump

## 2012-10-04 NOTE — L&D Delivery Note (Signed)
Delivery Note Pt progressed quickly w/ strong urge to push, and at 2125 the head was delivered w/ +retraction. NICU team had already been called to be in attendance of birth d/t prematurity and IDDM.   First maneuver: McRoberts Second maneuver: McRoberts w/ suprapubic pressure- Dr. Despina Hidden called  Third maneuver: Gaskins maneuver Fourth maneuver: attempted to deliver posterior arm- Dr. Despina Hidden in Fifth maneuver: loose nuchal x 1 reduced, Woodscrew  Sixth maneuver: Woodscrew w/ successful delivery of posterior arm and remainder of baby  At 9:28 PM a viable female was delivered via Vaginal, Spontaneous Delivery (Presentation:LOA). Total dystocia time: 3 mins. Cord clamped x 2 and cut, immediately taken to radiant warmer for resuscitation by NICU team. Please refer to their notes. APGAR: 3, 6, 8; weight 9 lb 1 oz (4111 g).   Placenta status: Intact, Spontaneous.  Cord:  with the following complications: None.  Cord pH: 7.12  Anesthesia: None  Episiotomy: None Lacerations: hemostatic 1st degree bilateral periurethral Suture Repair: n/a Est. Blood Loss (mL): 350  Mom to postpartum.  Baby to NICU. Plans to breast/bottlefeed, nexplanon for contraception, OP circumcision.   Insulin pump setting decreased by 1/3 by Dr. Chilton Greathouse, Cheron Every 09/08/2013, 10:07 PM

## 2012-11-07 ENCOUNTER — Ambulatory Visit (INDEPENDENT_AMBULATORY_CARE_PROVIDER_SITE_OTHER): Payer: PRIVATE HEALTH INSURANCE | Admitting: Internal Medicine

## 2012-11-07 ENCOUNTER — Encounter: Payer: Self-pay | Admitting: Internal Medicine

## 2012-11-07 VITALS — BP 90/62 | HR 115 | Temp 98.2°F | Resp 16 | Ht 66.0 in | Wt 138.0 lb

## 2012-11-07 DIAGNOSIS — E1065 Type 1 diabetes mellitus with hyperglycemia: Secondary | ICD-10-CM

## 2012-11-07 LAB — TSH: TSH: 0.4 u[IU]/mL (ref 0.35–5.50)

## 2012-11-07 LAB — T4, FREE: Free T4: 0.93 ng/dL (ref 0.60–1.60)

## 2012-11-07 LAB — HEMOGLOBIN A1C: Hgb A1c MFr Bld: 8.1 % — ABNORMAL HIGH (ref 4.6–6.5)

## 2012-11-07 MED ORDER — ACETONE (URINE) TEST VI STRP: 1.0000 | ORAL_STRIP | Status: DC | PRN

## 2012-11-07 MED ORDER — GLUCAGON (RDNA) 1 MG IJ KIT: 1.0000 mg | PACK | Freq: Once | INTRAMUSCULAR | Status: DC | PRN

## 2012-11-07 NOTE — Patient Instructions (Signed)
Please return in 2 weeks for follow-up. Please call me in 1-2 days with your sugars. Please pick up your glucagon kit and ketone strips from the pharmacy.  Basic Rules for Patients with Type I Diabetes Mellitus  1. The American Diabetes Association (ADA) recommended targets: - fasting sugar <130 - after meal sugar <180 - HbA1C <7%  2. Engage in ?150 min moderate exercise per week  3. Make sure you have ?8h of sleep every night as this helps both blood sugars and your weight.  4. Always keep a sugar log (not only record in your meter) and bring it to all appointments with [place].  5. If you are on a pump, know how to access the settings and to modify the parameters.  6.  Remember, you can always call the number on the back of the pump for emergencies related to the pump.  7. "15-15 rule" for hypoglycemia: if sugars are low, take 15 g of carbs** ("fast sugar" - e.g. 4 glucose tablets, 4 oz orange juice), wait 15 min, then check sugars again. If still <80, repeat. Continue  until your sugars >80, then eat a normal meal.   8. Teach family members and coworkers to inject glucagon. Have a glucagon set at home and one at work. They should call 911 after using the set.  9. If you are on a pump, set "insulin on board" time for 5 hours (if your sugars tend to be higher, can use 4 hours).   10. If you are on a pump, use the "dual wave bolus" setting for high fat foods (e.g. pizza). Start with a setting of 50%-50% (50% instant bolus and 50% prolonged bolus over 3h, for e.g.).    11. If you are on a pump, make sure the basal daily insulin dose is approximately equal (not larger) to the daily insulin you get from boluses, otherwise you are at risk for hypoglycemia.  12. Check sugar before driving. If <100, correct, and only start driving if sugars rise ?161. Check sugar every hour when on a long drive.  13. Check sugar before exercising. If <100, correct, and only start exercising if sugars rise  ?100. Check sugar every hour when on a long exercise routine and 1h after you finished exercising.   If >250, check urine for ketones. If you have moderate-large ketones in urine, do not start exercise. Hydrate yourself with clear liquids and correct the high sugar. Recheck sugars and ketones before attempting to exercise.  Be aware that you might need less insulin when exercising.  *intense, short, exercise bursts can increase your sugars, but  *less intense, longer (>1h), exercise routines can decrease your sugars.  If you are on a pump, you might need to decrease your basal rate by 10% or more (or even disconnect your pump) while you exercise to prevent low sugars. Do not disconnect your pump by more than 3 hours at a time! You also might need to decrease your insulin bolus for the meal prior to your exercise time by 20% or more.  14. Make sure you have a MedAlert bracelet or pendant mentioning "Type I Diabetes Mellitus". If you have a prior episode of severe hypoglycemia or hypoglycemia unawareness, it should also mention this.  15. Please do not walk barefoot. Inspect your feet for sores/cuts and let us know if you have them.  16. Please call [facility] with any questions and concerns [phone]).   **E.g. of "fast carbs":   first choice (15  g):  1 tube glucose gel, GlucoPouch 15, 2 oz glucose liquid   second choice (15-16 g):  3 or 4 glucose tablets (best taken  with water), 15 Dextrose Bits chewable   third choice (15-20 g):   cup fruit juice,  cup regular soda, 1 cup skim milk,  1 cup sports drink   fourth choice (15-20 g):  1 small tube [place] gel (not frosting), 2 tbsp raisins, 1 tbsp table sugar,  candy, jelly beans, gum drops - check package for carb amount   (adapted from: Juluis Rainier. "Insulin therapy and hypoglycemia" Endocrinol Metab Clin N Am 2012, 41: 57-87)

## 2012-11-07 NOTE — Progress Notes (Signed)
Subjective:     Patient ID: [name], female   DOB: 1991-06-16, 22 y.o.   MRN: [id number]  HPI [name] is a pleasant 22 y/o woman referred by Nancie Neas for management of DM1, without complications, with history of several admissions for DKA episodes, on insulin pump. She is going to [facility], but seeing several dr's there, does not know the name of her PCP. She is here with her 73 mo old girl and her mother.  Pt's DM1 was dx when she was 67 y/o. She was started on an insulin pump 6-7 months ago, when she was pregnant with her daughter (born in November of last year). She has a 723 [employer] - no CGM. She loves the pump. She uses NovoLog insulin. During her pregnancy, she was seen [facility] and her pump settings were adjusted there. After her pregnancy, she mentions that her pump settings were not changed. She is now in the same settings that she was using during the last month of her pregnancy. She stopped seeing the High-Risk OB and she saw Nancie Neas once. She tells me that, because her sugars were dropping on the current settings, she only uses the basal rates, and she does not bolus at all.   Last HbA1C was: Lab Results  Component Value Date   HGBA1C 9.1* 07/06/2012   Pt tells me that she "hardly ever" checks her sugars. They are in the 200s in the rare occasion she checks. Once every 2 weeks she wakes up in the 40s. Lowest CBG: 30s. Has hypoglycemia awareness, but only at <45. She has been admitted 5x in last 2 years for DKA. Highest CBG since she gave birth: 600, one time, but does not check often. When her sugars are high, she starts having polyuria, polydipsia, but not blurry vision.   Pump settings (the same from before giving birth!) Basal rates: - 12 am- 6 am: 1.1 units/h - 6 am - 8 am: 1.2 - 8 am - 12 am: 1.5 ICR: 1:8 Target CBG: 105 ISF: 30 Insulin on board: active, at 4h She does not use the bolus wizard, was not sure what this  means She does not have temporary basal rates set. She used dual wave boluses during her pregnancy, not anymore She changes her infusion sets every 3 days, last change was this morning.  Last eye exam: last year. ? DR. No numbness/tingling in legs. No CKD. She did have the flu vaccine this season.  She did not have breakfast today. Sugar in the office: 333.  She does not exercise regularly, but walks 2x a week  Review of Systems Constitutional: + weight gain/loss: 140 >> 170 (with pregnancy) >> 138 lbs; no fatigue, + subjective hypothermia Eyes: + blurry vision, no xerophthalmia ENT: no sore throat, no nodules palpated in throat, no dysphagia/odynophagia, no hoarseness Cardiovascular: no CP/SOB/palpitations/leg swelling Respiratory: no cough/SOB Gastrointestinal: no N/V/D/C Musculoskeletal: no muscle/joint aches Skin: no rashes Neurological: no tremors/numbness/tingling/dizziness Psychiatric: no depression/had anxiety before the pregnancy   Past Medical History  Diagnosis Date  . 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 Corneal abrasion   . Candidiasis   . Tubal pregnancy   . Diabetes mellitus     dx age 7  . Diabetes mellitus type I   . Urinary tract infection   . Pregnancy induced hypertension    Past Surgical History  Procedure Date  . Wisdom tooth extraction 2012   History  Social History  . Marital Status: Single    Spouse Name: N/A    Number of Children: 1   Occupational History  . none   Social History Main Topics  . Smoking status: <7 cigs per day    Types: Cigarettes  . Smokeless tobacco: Never Used     Comment: quit with + preg  . Alcohol Use: No  . Drug Use: No  . Sexually Active: Yes   Other Topics Concern  . Not on file   Social History Narrative  . No narrative on file   Current Outpatient Prescriptions on File Prior to Visit  Medication Sig Dispense Refill  . ibuprofen (ADVIL,MOTRIN) 200 MG tablet Take 400 mg by mouth  once as needed. For fever      . insulin aspart (NOVOLOG) 100 UNIT/ML injection 1 Units by Other route continuous. 1 unit for every 7 grams of carbs.      . ondansetron (ZOFRAN ODT) 8 MG disintegrating tablet Take 1 tablet (8 mg total) by mouth every 8 (eight) hours as needed for nausea.  12 tablet  0   No Known Allergies  Family History  Problem Relation Age of Onset  . Hypertension Mother, GM   . Cancer GM, GF   . Anesthesia problems Neg Hx    Objective:   Physical Exam BP 90/62  Pulse 115  Temp 98.2 F (36.8 C) (Oral)  Resp 16  Ht 5\' 6"  (1.676 m)  Wt 138 lb (62.596 kg)  BMI 22.27 kg/m2  SpO2 98%  LMP 10/23/2012  Breastfeeding? No Constitutional: normal weight, in NAD Eyes: PERRLA, EOMI, no exophthalmos ENT: moist mucous membranes, no thyromegaly, no cervical lymphadenopathy Cardiovascular: tachycardia, RR, No MRG Respiratory: CTA B Gastrointestinal: abdomen soft, NT, ND, BS+ Musculoskeletal: no deformities, strength intact in all 4 Skin: moist, warm, no rashes Neurological: no tremor with outstretched hands, DTR normal in all 4  Assessment:     1. DM1, uncontrolled, with hypoglycemia unawareness - frequent hypoglycemia - in ED in 09/2012 for CBG in the 30s - in PCP's office - repeatedly admitted for DKA in last 2 years - on insulin pump, with inadequate settings after pregnancy  Plan:     1. This is an unfortunate situation, in which the patient was started on an insulin pump during pregnancy, however she was not followed after her pregnancy and she continued on the same pump settings. The patient fortunately became noncompliant with the excessive insulin doses that she was getting from the current settings, but still developed serious hypoglycemia episodes, with subsequent  hypoglycemia awareness unless she gets to the 40s. - We discussed about the fact that her insulin pump settings need to be changed now after her pregnancy, and we made the following changes (pt  entered them in the pump during the appt): Basal rates: - 12 am- 6 am: 1.1 units/h >> 0.6 units/h - 6 am - 8 am: 1.2 >> 0.6 units/h - 8 am - 12 am: 1.5 >> 0.6 units/h ICR: 1:8 >> 1:15 Target CBG: 105 >> 150 ISF: 30 >> 50 Insulin on board: active, at 4h >> will keep at 4h She does not use the bolus wizard, was not sure what this means >> explained what this is and  advised her to use it, especially since it links with the pump She does not have temporary basal rates set >> we did not set any for now She used dual wave boluses during her pregnancy, not anymore >> advised  her to start using  them - We did a simple calculation about how to use the target CBG, ISF and ICR to calculate the bolus size. She did not know how to estimate how much to bolus before, but she understood the principle immediately. - She is determing to start checking her sugars and to take control of her diabetes - I advised her to get a med alert bracelet mentioning diabetes mellitus type 1, hypoglycemia - she had one in the past, but she had to change it seems her insulin doses changed. I advised her not to add the insulin doses on the bracelet - Discussed about hypoglycemia management, including the 15-15 rule, for which I also gave her a handout. We discussed about when to use the glucagon kit, and I gave her a prescription for 2 sets, to have one at her mother's house and one of her boyfriend's house - I advised her not to drive if her sugars lower than 100 - She should have glucose tablets with her all the time, but advised her how many to take - Given keto sticks and advised when to check her urine for ketones and when to become alarmed - Advised her that her sugars might be high in the next one or 2 days, and she should call me tomorrow or the day after tomorrow with her sugars - Given general recommendations for type I diabetics on pump  - I will refer her to diabetes education  - I would check her hemoglobin A1c,  TSH, and free T4 now - I advised her to join my chart so we can communicate more efficiently -I advised her to download her pump in to [facility] and to let me know when she does upload so I can check her account before the appointment. She would need to reset her password and let me know the username and the password through my chart.  - She appears dehydrated, tachycardic, and her glucose is 333. I advised her to to hydrate herself with water but not sweet liquids. He does not like plain water but drinks unsweet tea. - I would see her back in 2 weeks   Time spent with the pt:1h, of which >50% was spent in reviewing and adjusting her pump settings, explaining the dangers of having hypoglycemia (including arrhythmia and even death), educating pt about how to prevent and how to correct low sugars, teaching pt about basal and bolus insulin concepts (including how to calculate boluses), etc. - also please see above.  Office Visit on 11/07/2012  Component Date Value Range Status  . Hemoglobin A1C 11/07/2012 8.1* 4.6 - 6.5 % Final   Glycemic Control Guidelines for People with Diabetes:Non Diabetic:  <6%Goal of Therapy: <7%Additional Action Suggested:  >8%   . TSH 11/07/2012 0.40  0.35 - 5.50 uIU/mL Final  . Free T4 11/07/2012 0.93  0.60 - 1.60 ng/dL Final

## 2012-11-21 ENCOUNTER — Ambulatory Visit (INDEPENDENT_AMBULATORY_CARE_PROVIDER_SITE_OTHER): Payer: PRIVATE HEALTH INSURANCE | Admitting: Internal Medicine

## 2012-11-21 ENCOUNTER — Encounter: Payer: Self-pay | Admitting: Internal Medicine

## 2012-11-21 VITALS — BP 86/62 | HR 99 | Temp 98.3°F | Resp 16 | Wt 135.0 lb

## 2012-11-21 DIAGNOSIS — E1065 Type 1 diabetes mellitus with hyperglycemia: Secondary | ICD-10-CM

## 2012-11-21 NOTE — Progress Notes (Signed)
Subjective:     Patient ID: [name], female   DOB: May 01, 1991, 22 y.o.   MRN: [id number]  HPI [name] is a pleasant 22 y/o woman returning for f/u of DM1, dx 2005, without complications, with history of several admissions for DKA episodes (admitted 5x in last 2 years for DKA), on insulin pump. She is going to [facility], but seeing several dr's there, does not know the name of her PCP. Last visit 2 weeks ago.  She was started on an insulin pump 7 months ago, when she was pregnant with her daughter (born in November of last year). She has a 723 Medtronic [employer] - no CGM. During her pregnancy, she was seen [facility] and her pump settings were adjusted there. After her pregnancy, her pump settings were not changed, and, as her sugars were dropping on these settings, she only uses the basal rates, and she did not bolus at all. She told me that she "hardly ever" checked her sugars, but they were in the 200s in the rare occasion she checked, spiked with lows in the 30's and 40's. At last visit 2 weeks ago, I refer her to [facility] and started the process for her to get the new Medtronic 560G pump + CGM. I also did my best to educate the pt about dangers of low sugars and how to prevent them. I gave her a Glucagon kit Rx and advised her to get a MedAlert bracelet or pendant. We changed most of her pump settings at last visit: Basal rates: - 12 am- 6 am: 1.1 units/h >> 0.6 units/h - 6 am - 8 am: 1.2 >> 0.6 units/h - 8 am - 12 am: 1.5 >> 0.6 units/h ICR: 1:8 >> 1:15 Target CBG: 105 >> 150 ISF: 30 >> 50 Insulin on board: active, at 4h >> will keep at 4h She did not use the bolus wizard, was not sure what this means >> explained what this is and advised her to use it, especially since it links with the pump She did not have temporary basal rates set >> we did not set any at last visit She used dual wave boluses during her pregnancy >> at last visit advised her to start  using them  Lowest sugars now: 110. Has hypoglycemia awareness, but only at <45. Highest in the last 2 weeks: 390, but sugars have been in the 300s especially in the mornings. She forgot her log but per her recall, her sugars are: - am: 300s - lunch: 110-150 - dinner: 200-290 - bedtime: 190-200  Last HbA1C was: Lab Results  Component Value Date   HGBA1C 8.1* 11/07/2012   She changes her infusion sets every 3 days. No problem with the infusion sites.  Last eye exam: last year. ? DR. No numbness/tingling in legs. No CKD. She did have the flu vaccine this season. She does not exercise regularly, but walks 2x a week. In   I reviewed pt's medications, allergies, PMH, social hx, family hx and no changes required, other than resolving her pregnancy issues from the problem list.  Review of Systems Constitutional: no weight gain/loss; no fatigue, no subjective hypothermia, denies increased thirst or urination Eyes: no  blurry vision, no xerophthalmia ENT: no sore throat, no nodules palpated in throat, no dysphagia/odynophagia, no hoarseness Cardiovascular: no CP/SOB/palpitations/leg swelling Respiratory: no cough/SOB Gastrointestinal: no N/V/D/C Musculoskeletal: no muscle/joint aches Skin: no rashes Neurological: no tremors/numbness/tingling/dizziness Psychiatric: no depression/anxiety  Objective:   Physical Exam BP 86/62  Pulse 99  Temp(Src) 98.3 F (36.8 C) (Oral)  Resp 16  Wt 135 lb (61.236 kg)  BMI 21.8 kg/m2  SpO2 95%  LMP 11/17/2012  Breastfeeding? No Wt Readings from Last 3 Encounters:  11/21/12 135 lb (61.236 kg) (63%*, Z = 0.32)  11/07/12 138 lb (62.596 kg) (67%*, Z = 0.44)  10/02/12 140 lb (63.504 kg) (70%*, Z = 0.53)   * Growth percentiles are based on CDC 20 Years data.   Constitutional: normal weight, in NAD Eyes: PERRLA, EOMI, no exophthalmos ENT: moist mucous membranes, no thyromegaly, no cervical lymphadenopathy Cardiovascular: tachycardia, RR, No  MRG Respiratory: CTA B Gastrointestinal: abdomen soft, NT, ND, BS+ Musculoskeletal: no deformities, strength intact in all 4 Skin: moist, warm, no rashes Neurological: no tremor with outstretched hands, DTR normal in all 4  Assessment:     1. DM1, uncontrolled, with hypoglycemia unawareness - history of frequent hypoglycemia do to continuing her pregnancy insulin pump settings even after she gave birth  - in ED in 09/2012 for CBG in the 30s - in PCP's office - repeatedly admitted for DKA in last 2 years  Plan:     1. At last visit, I changed all of her insulin pump settings, and her sugars are improved, but they are still high especially in the morning. I think at this point we can increase her basal rates overnight from 0.6 to 0.8 U/h (I explained to her that this might not be enough for her and will continue to change the basal rates in the following weeks). For now, I'm not going to change her daytime basal rate, as she has lower sugars before lunch. She does mention that she is more active in the morning, running after her fianc's children. I therefore told her to change her insulin to carb ratio from 1:15 to 1:20 only before breakfast (8 to 11 AM). Will keep the rest of the insulin to carb ratios the same for now. -  She will have an appointment with Pincus Large from diabetes education on November 29, 2012. Will try to obtain her then new Medtronic MiniMed pump plus CGM.  - she will need basal testing soon after she gets the new insulin pump - she will need to download her pump in to [facility] and to let me know when she does upload so I can check her account before the appointment. She would need to reset her password and let me know the username and the password through my chart.  - I will see her back in 3 weeks, and strongly advised her to bring her sugar log with her. I gave her a new CBG log today. - I advised her to join my chart so we can communicate more efficiently - I gave  her my telephone number, and I advised her to call me if her sugars stay high or low, and not wait until the next appointment - She agrees to plan

## 2012-11-21 NOTE — Patient Instructions (Addendum)
Please increase the basal rates as follows: - 12 am - 8 am: 0.8 units/h - 8 am - 12 am: 0.6 units/h Please change the ICR from 8 am-11 am from 1:15 to 1:20. Return to clinic in 3 weeks with your sugar log.

## 2012-11-29 ENCOUNTER — Ambulatory Visit: Payer: PRIVATE HEALTH INSURANCE | Admitting: *Deleted

## 2012-12-12 ENCOUNTER — Ambulatory Visit: Payer: PRIVATE HEALTH INSURANCE | Admitting: Internal Medicine

## 2013-01-11 ENCOUNTER — Telehealth: Payer: Self-pay | Admitting: Pharmacist

## 2013-01-11 NOTE — Telephone Encounter (Signed)
Will see patient on Wed, April 23rd at 2pm  Mother notified

## 2013-01-15 NOTE — Telephone Encounter (Signed)
Sched Template adjusted to add this apptointment, Per Tammy's notes, pt's mother is aware of appt time and date.

## 2013-01-24 ENCOUNTER — Ambulatory Visit (INDEPENDENT_AMBULATORY_CARE_PROVIDER_SITE_OTHER): Payer: PRIVATE HEALTH INSURANCE | Admitting: Pharmacist

## 2013-01-24 VITALS — BP 96/68 | HR 75 | Ht 65.0 in | Wt 126.0 lb

## 2013-01-24 DIAGNOSIS — E109 Type 1 diabetes mellitus without complications: Secondary | ICD-10-CM

## 2013-01-24 NOTE — Progress Notes (Signed)
Diabetes Follow-Up Visit Chief Complaint:   Chief Complaint  Patient presents with  . Diabetes    type 1     Filed Vitals:   01/24/13 1416  BP: 96/68  Pulse: 75   Filed Weights   01/24/13 1416  Weight: 126 lb (57.153 kg)      HPI: PI  [name] is a 22 yo woman here for adjustment of insulin pump.  She was diagnosed with type 1 DM in 2005 and has a history of poor compliance with follow up and several hospital admissions for DKA  (admitted 5x in last 2 years for DKA). She was seeing a specialist in [place] but that was too far to go for regular visits. She was started on an insulin pump 04/2012. She has a 723 Medtronic [employer] - no CGM.  She uses Novolog insulin in pump.  She has not been checking her BG regularly.  She states that she can feel when her BG is elevated and that she feels that it is always elevated.  BG today in office was 181  Reviewed downloaded insulin pump records - shows that pt has only checked BG 3 times in last 2 weeks - these were times that she felt BG was low.  Readings were 68, 77 and 64.  Per patient these lows occurred after she gave herself too much insulin after feeling that BG was high and gave 15+ units to bring BG down.  Current Basal rates:  - 12 am- 6 am:  0.8 units/h       - 6 am - 8 am: 0.6 units/h  - 8 am - 12 am:  0.6 units/h   Basal Rate during pregnancy (patient said BG was much better controlled at these rates)  12 am- 6 am: 1.1 units/h  - 6 am - 8 am: 1.2 units/h  - 8 am - 12 am: 1.5 units/h   ICR: 12am - 8am 1:15           8am - 11am 1:20          11am - 12am 1:15    Target CBG: 150 ISF: 50  Insulin on board: active, at 4h    Exam Edema:  neg  Polyuria:  neg  Polydipsia:  neg Polyphagia:  neg  BMI:  Body mass index is 20.97 kg/(m^2).   Weight changes:  stablet General Appearance:  alert, oriented, no acute distress and well nourished Mood/Affect:  normal   Low fat/carbohydrate diet?  Yes Nicotine  Abuse?  No Medication Compliance?  Yes Exercise?  No Alcohol Abuse?  No  Home BG Monitoring:  Checking 0 times a day. Average:  N/A  High: N/A  Low:  N/A   Lab Results  Component Value Date   HGBA1C 8.1* 11/07/2012    No results found for this basename: [facility]    Lab Results  Component Value Date   CHOL 169 05/24/2011   HDL 20* 05/24/2011   LDLCALC UNABLE TO CALCULATE IF TRIGLYCERIDE OVER 400 mg/dL 1/61/0960   TRIG 454* 0/98/1191   CHOLHDL 8.5 05/24/2011      Assessment: 1.  Diabetes.  Type 1 uncontrolled and non compliant with BG checking 2.  Blood Pressure.  controlled  Recommendations: 1.  Basal Rate changes - feel with inconsistent use of bolus wizard and checking BG we need to practically start over  Change basal rate to 0.9u/hour all day  Will have patient monitor BG more closely and make  adjustments as needed 2.  Reviewed HBG goals:  Fasting 80-130 and 1-2 hour post prandial <180.  Patient is instructed to check BG 4 times per day.    3.  Reviewed s/s of hypoglycemia and hyperglycemia.  Patient is instructed to call office if BG is less than 70 or greater than 250 on more than 2 occassions in 1 week for pump adjustment.  4.  Patient in encouraged to reset her account in [facility] so that she can download BG and pump info to me between visits  5.  BP goal < 140/80. 6. Will follow up via phone in 1 week and RTC in 1 month or sooner if BG is greater than 250 or less than 70 7.  Will get Cholesterol, A1C and microalbumin at next visit.   Time spent counseling patient:  30 minutes    Henrene Pastor, PharmD, CPP

## 2013-02-06 ENCOUNTER — Telehealth: Payer: Self-pay | Admitting: Pharmacist

## 2013-02-06 NOTE — Telephone Encounter (Signed)
Patient was called to follow up after adjusting insulin pump 2 weeks ago.  [name] reports that BG has improved greatly with readings from 90 to 185.   Will continue current pump settings and follow up in 1 month for adjustment - appt June 9th at 10am

## 2013-03-12 ENCOUNTER — Ambulatory Visit: Payer: Self-pay

## 2013-03-14 ENCOUNTER — Encounter: Payer: Self-pay | Admitting: *Deleted

## 2013-03-15 ENCOUNTER — Ambulatory Visit (INDEPENDENT_AMBULATORY_CARE_PROVIDER_SITE_OTHER): Payer: PRIVATE HEALTH INSURANCE

## 2013-03-15 ENCOUNTER — Other Ambulatory Visit: Payer: Self-pay | Admitting: Obstetrics & Gynecology

## 2013-03-15 DIAGNOSIS — O3680X1 Pregnancy with inconclusive fetal viability, fetus 1: Secondary | ICD-10-CM

## 2013-03-15 DIAGNOSIS — O3680X0 Pregnancy with inconclusive fetal viability, not applicable or unspecified: Secondary | ICD-10-CM

## 2013-03-26 ENCOUNTER — Ambulatory Visit (INDEPENDENT_AMBULATORY_CARE_PROVIDER_SITE_OTHER): Payer: PRIVATE HEALTH INSURANCE | Admitting: Adult Health

## 2013-03-26 ENCOUNTER — Encounter: Payer: Self-pay | Admitting: Adult Health

## 2013-03-26 VITALS — BP 100/54 | Wt 128.0 lb

## 2013-03-26 DIAGNOSIS — Z3481 Encounter for supervision of other normal pregnancy, first trimester: Secondary | ICD-10-CM

## 2013-03-26 DIAGNOSIS — O4400 Placenta previa specified as without hemorrhage, unspecified trimester: Secondary | ICD-10-CM

## 2013-03-26 DIAGNOSIS — Z331 Pregnant state, incidental: Secondary | ICD-10-CM

## 2013-03-26 DIAGNOSIS — O4402 Placenta previa specified as without hemorrhage, second trimester: Secondary | ICD-10-CM

## 2013-03-26 DIAGNOSIS — O24912 Unspecified diabetes mellitus in pregnancy, second trimester: Secondary | ICD-10-CM

## 2013-03-26 DIAGNOSIS — Z1389 Encounter for screening for other disorder: Secondary | ICD-10-CM

## 2013-03-26 DIAGNOSIS — E119 Type 2 diabetes mellitus without complications: Secondary | ICD-10-CM

## 2013-03-26 DIAGNOSIS — O24919 Unspecified diabetes mellitus in pregnancy, unspecified trimester: Secondary | ICD-10-CM

## 2013-03-26 LAB — COMPREHENSIVE METABOLIC PANEL
Albumin: 3.9 g/dL (ref 3.5–5.2)
BUN: 9 mg/dL (ref 6–23)
CO2: 25 mEq/L (ref 19–32)
Calcium: 9.4 mg/dL (ref 8.4–10.5)
Glucose, Bld: 245 mg/dL — ABNORMAL HIGH (ref 70–99)
Potassium: 4 mEq/L (ref 3.5–5.3)
Sodium: 135 mEq/L (ref 135–145)
Total Protein: 6.4 g/dL (ref 6.0–8.3)

## 2013-03-26 LAB — CBC
HCT: 37.1 % (ref 36.0–46.0)
Hemoglobin: 13.1 g/dL (ref 12.0–15.0)
MCH: 32.5 pg (ref 26.0–34.0)
MCHC: 35.3 g/dL (ref 30.0–36.0)
MCV: 92.1 fL (ref 78.0–100.0)
RBC: 4.03 MIL/uL (ref 3.87–5.11)

## 2013-03-26 LAB — POCT URINALYSIS DIPSTICK
Blood, UA: NEGATIVE
Glucose, UA: 4
Ketones, UA: NEGATIVE

## 2013-03-26 NOTE — Progress Notes (Signed)
[name] is a 22 year old white female in for a new ob visit. Has no completes,no bleeding no pain.Skin warm and dry. Neck: mid line trachea, normal thyroid. Lungs: clear to ausculation bilaterally. Cardiovascular: regular rate and rhythm.Baby's heart rate 164.She is on insulin pump .9 units of novalog/day. Prenatal labs drawn with A1c and CMP. Will see in 1 week for IT/NT and increase folic acid to 4 per day.Check blood sugars and bring log back.

## 2013-03-26 NOTE — Patient Instructions (Addendum)
Pregnancy - Second Trimester The second trimester of pregnancy (3 to 6 months) is a period of rapid growth for you and your baby. At the end of the sixth month, your baby is about 9 inches long and weighs 1 1/2 pounds. You will begin to feel the baby move between 18 and 20 weeks of the pregnancy. This is called quickening. Weight gain is faster. A clear fluid (colostrum) may leak out of your breasts. You may feel small contractions of the womb (uterus). This is known as false labor or Braxton-Hicks contractions. This is like a practice for labor when the baby is ready to be born. Usually, the problems with morning sickness have usually passed by the end of your first trimester. Some women develop small dark blotches (called cholasma, mask of pregnancy) on their face that usually goes away after the baby is born. Exposure to the sun makes the blotches worse. Acne may also develop in some pregnant women and pregnant women who have acne, may find that it goes away. PRENATAL EXAMS  Blood work may continue to be done during prenatal exams. These tests are done to check on your health and the probable health of your baby. Blood work is used to follow your blood levels (hemoglobin). Anemia (low hemoglobin) is common during pregnancy. Iron and vitamins are given to help prevent this. You will also be checked for diabetes between 24 and 28 weeks of the pregnancy. Some of the previous blood tests may be repeated.  The size of the uterus is measured during each visit. This is to make sure that the baby is continuing to grow properly according to the dates of the pregnancy.  Your blood pressure is checked every prenatal visit. This is to make sure you are not getting toxemia.  Your urine is checked to make sure you do not have an infection, diabetes or protein in the urine.  Your weight is checked often to make sure gains are happening at the suggested rate. This is to ensure that both you and your baby are growing  normally.  Sometimes, an ultrasound is performed to confirm the proper growth and development of the baby. This is a test which bounces harmless sound waves off the baby so your caregiver can more accurately determine due dates. Sometimes, a test is done on the amniotic fluid surrounding the baby. This test is called an amniocentesis. The amniotic fluid is obtained by sticking a needle into the belly (abdomen). This is done to check the chromosomes in instances where there is a concern about possible genetic problems with the baby. It is also sometimes done near the end of pregnancy if an early delivery is required. In this case, it is done to help make sure the baby's lungs are mature enough for the baby to live outside of the womb. CHANGES OCCURING IN THE SECOND TRIMESTER OF PREGNANCY Your body goes through many changes during pregnancy. They vary from person to person. Talk to your caregiver about changes you notice that you are concerned about.  During the second trimester, you will likely have an increase in your appetite. It is normal to have cravings for certain foods. This varies from person to person and pregnancy to pregnancy.  Your lower abdomen will begin to bulge.  You may have to urinate more often because the uterus and baby are pressing on your bladder. It is also common to get more bladder infections during pregnancy. You can help this by drinking lots of fluids  and emptying your bladder before and after intercourse.  You may begin to get stretch marks on your hips, abdomen, and breasts. These are normal changes in the body during pregnancy. There are no exercises or medicines to take that prevent this change.  You may begin to develop swollen and bulging veins (varicose veins) in your legs. Wearing support hose, elevating your feet for 15 minutes, 3 to 4 times a day and limiting salt in your diet helps lessen the problem.  Heartburn may develop as the uterus grows and pushes up  against the stomach. Antacids recommended by your caregiver helps with this problem. Also, eating smaller meals 4 to 5 times a day helps.  Constipation can be treated with a stool softener or adding bulk to your diet. Drinking lots of fluids, and eating vegetables, fruits, and whole grains are helpful.  Exercising is also helpful. If you have been very active up until your pregnancy, most of these activities can be continued during your pregnancy. If you have been less active, it is helpful to start an exercise program such as walking.  Hemorrhoids may develop at the end of the second trimester. Warm sitz baths and hemorrhoid cream recommended by your caregiver helps hemorrhoid problems.  Backaches may develop during this time of your pregnancy. Avoid heavy lifting, wear low heal shoes, and practice good posture to help with backache problems.  Some pregnant women develop tingling and numbness of their hand and fingers because of swelling and tightening of ligaments in the wrist (carpel tunnel syndrome). This goes away after the baby is born.  As your breasts enlarge, you may have to get a bigger bra. Get a comfortable, cotton, support bra. Do not get a nursing bra until the last month of the pregnancy if you will be nursing the baby.  You may get a dark line from your belly button to the pubic area called the linea nigra.  You may develop rosy cheeks because of increase blood flow to the face.  You may develop spider looking lines of the face, neck, arms, and chest. These go away after the baby is born. HOME CARE INSTRUCTIONS   It is extremely important to avoid all smoking, herbs, alcohol, and unprescribed drugs during your pregnancy. These chemicals affect the formation and growth of the baby. Avoid these chemicals throughout the pregnancy to ensure the delivery of a healthy infant.  Most of your home care instructions are the same as suggested for the first trimester of your pregnancy.  Keep your caregiver's appointments. Follow your caregiver's instructions regarding medicine use, exercise, and diet.  During pregnancy, you are providing food for you and your baby. Continue to eat regular, well-balanced meals. Choose foods such as meat, fish, milk and other low fat dairy products, vegetables, fruits, and whole-grain breads and cereals. Your caregiver will tell you of the ideal weight gain.  A physical sexual relationship may be continued up until near the end of pregnancy if there are no other problems. Problems could include early (premature) leaking of amniotic fluid from the membranes, vaginal bleeding, abdominal pain, or other medical or pregnancy problems.  Exercise regularly if there are no restrictions. Check with your caregiver if you are unsure of the safety of some of your exercises. The greatest weight gain will occur in the last 2 trimesters of pregnancy. Exercise will help you:  Control your weight.  Get you in shape for labor and delivery.  Lose weight after you have the baby.  Wear  a good support or jogging bra for breast tenderness during pregnancy. This may help if worn during sleep. Pads or tissues may be used in the bra if you are leaking colostrum.  Do not use hot tubs, steam rooms or saunas throughout the pregnancy.  Wear your seat belt at all times when driving. This protects you and your baby if you are in an accident.  Avoid raw meat, uncooked cheese, cat litter boxes, and soil used by cats. These carry germs that can cause birth defects in the baby.  The second trimester is also a good time to visit your dentist for your dental health if this has not been done yet. Getting your teeth cleaned is okay. Use a soft toothbrush. Brush gently during pregnancy.  It is easier to leak urine during pregnancy. Tightening up and strengthening the pelvic muscles will help with this problem. Practice stopping your urination while you are going to the bathroom.  These are the same muscles you need to strengthen. It is also the muscles you would use as if you were trying to stop from passing gas. You can practice tightening these muscles up 10 times a set and repeating this about 3 times per day. Once you know what muscles to tighten up, do not perform these exercises during urination. It is more likely to contribute to an infection by backing up the urine.  Ask for help if you have financial, counseling, or nutritional needs during pregnancy. Your caregiver will be able to offer counseling for these needs as well as refer you for other special needs.  Your skin may become oily. If so, wash your face with mild soap, use non-greasy moisturizer and oil or cream based makeup. MEDICINES AND DRUG USE IN PREGNANCY  Take prenatal vitamins as directed. The vitamin should contain 1 milligram of folic acid. Keep all vitamins out of reach of children. Only a couple vitamins or tablets containing iron may be fatal to a baby or young child when ingested.  Avoid use of all medicines, including herbs, over-the-counter medicines, not prescribed or suggested by your caregiver. Only take over-the-counter or prescription medicines for pain, discomfort, or fever as directed by your caregiver. Do not use aspirin.  Let your caregiver also know about herbs you may be using.  Alcohol is related to a number of birth defects. This includes fetal alcohol syndrome. All alcohol, in any form, should be avoided completely. Smoking will cause low birth rate and premature babies.  Street or illegal drugs are very harmful to the baby. They are absolutely forbidden. A baby born to an addicted mother will be addicted at birth. The baby will go through the same withdrawal an adult does. SEEK MEDICAL CARE IF:  You have any concerns or worries during your pregnancy. It is better to call with your questions if you feel they cannot wait, rather than worry about them. SEEK IMMEDIATE MEDICAL CARE  IF:   An unexplained oral temperature above 102 F (38.9 C) develops, or as your caregiver suggests.  You have leaking of fluid from the vagina (birth canal). If leaking membranes are suspected, take your temperature and tell your caregiver of this when you call.  There is vaginal spotting, bleeding, or passing clots. Tell your caregiver of the amount and how many pads are used. Light spotting in pregnancy is common, especially following intercourse.  You develop a bad smelling vaginal discharge with a change in the color from clear to white.  You continue to feel  sick to your stomach (nauseated) and have no relief from remedies suggested. You vomit blood or coffee ground-like materials.  You lose more than 2 pounds of weight or gain more than 2 pounds of weight over 1 week, or as suggested by your caregiver.  You notice swelling of your face, hands, feet, or legs.  You get exposed to [place] measles and have never had them.  You are exposed to fifth disease or chickenpox.  You develop belly (abdominal) pain. Round ligament discomfort is a common non-cancerous (benign) cause of abdominal pain in pregnancy. Your caregiver still must evaluate you.  You develop a bad headache that does not go away.  You develop fever, diarrhea, pain with urination, or shortness of breath.  You develop visual problems, blurry, or double vision.  You fall or are in a car accident or any kind of trauma.  There is mental or physical violence at home. Document Released: 09/14/2001 Document Revised: 06/14/2012 Document Reviewed: 03/19/2009 [facility] Patient Information 2014 [place], [place]. Follow up in 1 week for IT/NT Check blood sugars and bring log next visit

## 2013-03-26 NOTE — Progress Notes (Signed)
Pt here today for New OB visit. Pt denies any problems at this time. Pt given [facility] form and lab consents to read over and sign.

## 2013-03-27 LAB — ANTIBODY SCREEN: Antibody Screen: NEGATIVE

## 2013-03-27 LAB — DRUG SCREEN, URINE, NO CONFIRMATION
Barbiturate Quant, Ur: NEGATIVE
Marijuana Metabolite: NEGATIVE
Opiate Screen, Urine: NEGATIVE
Phencyclidine (PCP): NEGATIVE
Propoxyphene: NEGATIVE

## 2013-03-27 LAB — URINALYSIS
Bilirubin Urine: NEGATIVE
Glucose, UA: 250 mg/dL — AB
Hgb urine dipstick: NEGATIVE
Ketones, ur: NEGATIVE mg/dL
Nitrite: NEGATIVE
Specific Gravity, Urine: 1.027 (ref 1.005–1.030)
pH: 8 (ref 5.0–8.0)

## 2013-03-27 LAB — RPR

## 2013-03-27 LAB — HEMOGLOBIN A1C
Hgb A1c MFr Bld: 8.8 % — ABNORMAL HIGH (ref <5.7)
Mean Plasma Glucose: 206 mg/dL — ABNORMAL HIGH (ref <117)

## 2013-03-27 LAB — VARICELLA ZOSTER ANTIBODY, IGG: Varicella IgG: 844.4 Index — ABNORMAL HIGH (ref <135.00)

## 2013-03-27 LAB — GC/CHLAMYDIA PROBE AMP: CT Probe RNA: NEGATIVE

## 2013-03-27 LAB — ABO AND RH: Rh Type: POSITIVE

## 2013-03-27 LAB — OXYCODONE SCREEN, UA, RFLX CONFIRM: Oxycodone Screen, Ur: NEGATIVE ng/mL

## 2013-03-28 LAB — URINE CULTURE

## 2013-04-09 ENCOUNTER — Encounter (HOSPITAL_COMMUNITY): Payer: Self-pay

## 2013-04-09 ENCOUNTER — Telehealth: Payer: Self-pay | Admitting: Obstetrics and Gynecology

## 2013-04-09 ENCOUNTER — Emergency Department (HOSPITAL_COMMUNITY)
Admission: EM | Admit: 2013-04-09 | Discharge: 2013-04-09 | Disposition: A | Discharge status: Home / Self Care | Payer: Medicaid Other | Attending: Emergency Medicine | Admitting: Emergency Medicine

## 2013-04-09 DIAGNOSIS — Z8742 Personal history of other diseases of the female genital tract: Secondary | ICD-10-CM | POA: Insufficient documentation

## 2013-04-09 DIAGNOSIS — R0789 Other chest pain: Secondary | ICD-10-CM | POA: Insufficient documentation

## 2013-04-09 DIAGNOSIS — Z8619 Personal history of other infectious and parasitic diseases: Secondary | ICD-10-CM | POA: Insufficient documentation

## 2013-04-09 DIAGNOSIS — E860 Dehydration: Secondary | ICD-10-CM

## 2013-04-09 DIAGNOSIS — Z87828 Personal history of other (healed) physical injury and trauma: Secondary | ICD-10-CM | POA: Insufficient documentation

## 2013-04-09 DIAGNOSIS — F319 Bipolar disorder, unspecified: Secondary | ICD-10-CM | POA: Insufficient documentation

## 2013-04-09 DIAGNOSIS — E119 Type 2 diabetes mellitus without complications: Secondary | ICD-10-CM | POA: Insufficient documentation

## 2013-04-09 DIAGNOSIS — F17200 Nicotine dependence, unspecified, uncomplicated: Secondary | ICD-10-CM | POA: Insufficient documentation

## 2013-04-09 DIAGNOSIS — Z87440 Personal history of urinary (tract) infections: Secondary | ICD-10-CM | POA: Insufficient documentation

## 2013-04-09 DIAGNOSIS — R51 Headache: Secondary | ICD-10-CM | POA: Insufficient documentation

## 2013-04-09 DIAGNOSIS — Z794 Long term (current) use of insulin: Secondary | ICD-10-CM | POA: Insufficient documentation

## 2013-04-09 DIAGNOSIS — Z79899 Other long term (current) drug therapy: Secondary | ICD-10-CM | POA: Insufficient documentation

## 2013-04-09 HISTORY — DX: Encounter for supervision of normal pregnancy, unspecified, unspecified trimester: Z34.90

## 2013-04-09 HISTORY — DX: Bipolar disorder, unspecified: F31.9

## 2013-04-09 LAB — BASIC METABOLIC PANEL
BUN: 10 mg/dL (ref 6–23)
Chloride: 102 mEq/L (ref 96–112)
Creatinine, Ser: 0.36 mg/dL — ABNORMAL LOW (ref 0.50–1.10)
Glucose, Bld: 243 mg/dL — ABNORMAL HIGH (ref 70–99)
Potassium: 3.7 mEq/L (ref 3.5–5.1)

## 2013-04-09 LAB — GLUCOSE, CAPILLARY

## 2013-04-09 LAB — CBC WITH DIFFERENTIAL/PLATELET
Eosinophils Absolute: 0.2 10*3/uL (ref 0.0–0.7)
HCT: 36.7 % (ref 36.0–46.0)
Hemoglobin: 13.4 g/dL (ref 12.0–15.0)
Lymphs Abs: 2.8 10*3/uL (ref 0.7–4.0)
MCH: 33.2 pg (ref 26.0–34.0)
MCHC: 36.5 g/dL — ABNORMAL HIGH (ref 30.0–36.0)
Monocytes Absolute: 0.5 10*3/uL (ref 0.1–1.0)
Monocytes Relative: 6 % (ref 3–12)
Neutro Abs: 5.3 10*3/uL (ref 1.7–7.7)
Neutrophils Relative %: 60 % (ref 43–77)
RBC: 4.04 MIL/uL (ref 3.87–5.11)

## 2013-04-09 MED ORDER — HYDROMORPHONE HCL PF 1 MG/ML IJ SOLN
0.5000 mg | Freq: Once | INTRAMUSCULAR | Status: AC
  Administered 2013-04-09: 0.5 mg via INTRAVENOUS
  Filled 2013-04-09: qty 1

## 2013-04-09 MED ORDER — SODIUM CHLORIDE 0.9 % IV BOLUS (SEPSIS)
1000.0000 mL | Freq: Once | INTRAVENOUS | Status: AC
  Administered 2013-04-09: 1000 mL via INTRAVENOUS

## 2013-04-09 NOTE — ED Notes (Signed)
3rd pregnancy, 1 live birth and 1 tubal. Due date jan. 8, 2015, is followed by dr. Despina Hidden.

## 2013-04-09 NOTE — ED Provider Notes (Signed)
History    This chart was scribed for Benny Lennert, MD, by Frederik Pear, ED scribe. The patient was seen in room APA14/AP[facility]14 and the patient's care was started at 1811.   CSN: [id number] Arrival date & time 04/09/13  1645  First MD Initiated Contact with Patient 04/09/13 1811     Chief Complaint  Patient presents with  . Headache  . Chest Pain   (Consider location/radiation/quality/duration/timing/severity/associated sxs/prior Treatment) Patient is a 22 y.o. female presenting with headaches and chest pain. The history is provided by the patient and medical records. No language interpreter was used.  Headache Pain location:  Generalized Radiates to:  Does not radiate Onset quality:  Sudden Duration:  1 day Timing:  Constant Progression:  Unable to specify Associated symptoms: no abdominal pain, no back pain, no congestion, no cough, no diarrhea, no fatigue, no seizures and no sinus pressure   Chest Pain Associated symptoms: headache   Associated symptoms: no abdominal pain, no back pain, no cough and no fatigue     HPI Comments: [name] is a 22 y.o. female with a h/o of DM who presents to the Emergency Department complaining of sudden onset CP that began on the right side of her chest wall and radiated to the center of her chest and began at 1230. She reports sometimes the pain is aggravated by deep breaths and sometimes it helps to alleviated it. She also complains of a HA that began this morning when she awoke. She reports a h/o of HA with her two previous pregnancies, which consisted of 1 live birth and 1 tubal. She is currently [redacted] weeks pregnant. She reports she treated the symptoms with Tylenol this afternoon without relief. In ED, she also complains she has not had a BM in 7 days. Denies having tried laxatives. Her follow up with her OBGYN who is Dr. Despina Hidden is 07/21. She also reports that her CBG has been running high. She reports she measures it 1x daily, and it was  182 yesterday. In ED, her CBG was 349. She reports she wears an insulin pump that is set on 0.9 and takes boluses when she eats. She reports she only removed it when she put on her gown in the ED.   PCP is Dr. Christell Constant.   Past Medical History  Diagnosis Date  . 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 Corneal abrasion   . Candidiasis   . Tubal pregnancy   . Diabetes mellitus     dx age 63  . Diabetes mellitus type I   . Urinary tract infection   . Pregnancy induced hypertension   . Bipolar disorder   . Pregnancy    Past Surgical History  Procedure Laterality Date  . Wisdom tooth extraction  2012   Family History  Problem Relation Age of Onset  . Anesthesia problems Neg Hx   . Cancer Maternal Grandmother     breast  . Hypertension Maternal Grandmother   . Cancer Maternal Grandfather     throat and lung   History  Substance Use Topics  . Smoking status: Current Every Day Smoker -- 0.50 packs/day for 2 years    Types: Cigarettes  . Smokeless tobacco: Never Used     Comment: quit with + preg  . Alcohol Use: No   OB History   Grav Para Term Preterm Abortions TAB SAB Ect Mult Living   3 1 0 1 1 0 0 1  0 1     Review of Systems  Constitutional: Negative for appetite change and fatigue.  HENT: Negative for congestion, sinus pressure and ear discharge.   Eyes: Negative for discharge.  Respiratory: Negative for cough.   Cardiovascular: Positive for chest pain.  Gastrointestinal: Negative for abdominal pain and diarrhea.  Genitourinary: Negative for frequency and hematuria.  Musculoskeletal: Negative for back pain.  Skin: Negative for rash.  Neurological: Positive for headaches. Negative for seizures.  Psychiatric/Behavioral: Negative for hallucinations.    Allergies  Review of patient's allergies indicates no known allergies.  Home Medications   Current Outpatient Rx  Name  Route  Sig  Dispense  Refill  . acetaminophen (TYLENOL) 500 MG tablet    Oral   Take 500 mg by mouth every 6 (six) hours as needed for pain.         . folic acid (FOLVITE) 400 MCG tablet   Oral   Take 400 mcg by mouth daily.         . insulin aspart (NOVOLOG) 100 UNIT/ML injection   Other   1 Units by Other route continuous. 1 unit for every 7 grams of carbs.         . Pediatric Multiple Vit-C-FA (FLINSTONES GUMMIES OMEGA-3 DHA) CHEW   Oral   Chew 2 each by mouth daily.         Marland Kitchen glucagon (GLUCAGON EMERGENCY) 1 MG injection   Intravenous   Inject 1 mg into the vein once as needed. For severe hypoglycemia.   2 each   prn    BP 97/57  Pulse 102  Temp(Src) 98.4 F (36.9 C) (Oral)  Resp 20  Ht 5\' 5"  (1.651 m)  Wt 127 lb 6 oz (57.777 kg)  BMI 21.2 kg/m2  SpO2 98%  LMP 01/09/2013 Physical Exam  Nursing note and vitals reviewed. Constitutional: She is oriented to person, place, and time. She appears well-developed.  HENT:  Head: Normocephalic.  Eyes: Conjunctivae and EOM are normal. No scleral icterus.  Neck: Neck supple. No thyromegaly present.  Cardiovascular: Normal rate and regular rhythm.  Exam reveals no gallop and no friction rub.   No murmur heard. Pulmonary/Chest: No stridor. She has no wheezes. She has no rales. She exhibits no tenderness.  Abdominal: She exhibits no distension. There is no tenderness. There is no rebound.  Musculoskeletal: Normal range of motion. She exhibits no edema.  Lymphadenopathy:    She has no cervical adenopathy.  Neurological: She is oriented to person, place, and time. Coordination normal.  Skin: No rash noted. No erythema.  Psychiatric: She has a normal mood and affect. Her behavior is normal.    ED Course  Procedures (including critical care time)  DIAGNOSTIC STUDIES: Oxygen Saturation is 98% on room air, normal by my interpretation.    COORDINATION OF CARE:  18:16- Discussed planned course of treatment with the patient, including dilaudid, IV fluids, CBC with differential, and basic  metabolic panel, who is agreeable at this time.  18:30- Medication Orders- sodium chloride 0.9% bolus 1,000 mL-once, hydromorphone (dilaudid) injection 0.5 mg- once.  19:46- Upon recheck, she is feeling somewhat better. She reports she gives herself an injection on a sliding scale with meals. She reports she inputs the number of carbs in the meals she is eating into her insulin pump, which gives her the medicine. Discussed checking CBG before each meal and recording it for a week and following up with her OBGYN.   Results for orders placed during the  hospital encounter of 04/09/13  GLUCOSE, CAPILLARY      Result Value Range   Glucose-Capillary 345 (*) 70 - 99 mg/dL   Comment 1 Documented in Chart     Comment 2 Notify RN    GLUCOSE, CAPILLARY      Result Value Range   Glucose-Capillary 349 (*) 70 - 99 mg/dL  CBC WITH DIFFERENTIAL      Result Value Range   WBC 8.8  4.0 - 10.5 K/uL   RBC 4.04  3.87 - 5.11 MIL/uL   Hemoglobin 13.4  12.0 - 15.0 g/dL   HCT 16.1  09.6 - 04.5 %   MCV 90.8  78.0 - 100.0 fL   MCH 33.2  26.0 - 34.0 pg   MCHC 36.5 (*) 30.0 - 36.0 g/dL   RDW 40.9  81.1 - 91.4 %   Platelets 265  150 - 400 K/uL   Neutrophils Relative % 60  43 - 77 %   Neutro Abs 5.3  1.7 - 7.7 K/uL   Lymphocytes Relative 32  12 - 46 %   Lymphs Abs 2.8  0.7 - 4.0 K/uL   Monocytes Relative 6  3 - 12 %   Monocytes Absolute 0.5  0.1 - 1.0 K/uL   Eosinophils Relative 2  0 - 5 %   Eosinophils Absolute 0.2  0.0 - 0.7 K/uL   Basophils Relative 1  0 - 1 %   Basophils Absolute 0.0  0.0 - 0.1 K/uL  BASIC METABOLIC PANEL      Result Value Range   Sodium 136  135 - 145 mEq/L   Potassium 3.7  3.5 - 5.1 mEq/L   Chloride 102  96 - 112 mEq/L   CO2 24  19 - 32 mEq/L   Glucose, Bld 243 (*) 70 - 99 mg/dL   BUN 10  6 - 23 mg/dL   Creatinine, Ser 7.82 (*) 0.50 - 1.10 mg/dL   Calcium 9.3  8.4 - 95.6 mg/dL   GFR calc non Af Amer >90  >90 mL/min   GFR calc Af Amer >90  >90 mL/min   Labs Reviewed  GLUCOSE,  CAPILLARY - Abnormal; Notable for the following:    Glucose-Capillary 345 (*)    All other components within normal limits  GLUCOSE, CAPILLARY - Abnormal; Notable for the following:    Glucose-Capillary 349 (*)    All other components within normal limits   No results found. No diagnosis found.  MDM  Chest wall pain and poorly controled db  The chart was scribed for me under my direct supervision.  I personally performed the history, physical, and medical decision making and all procedures in the evaluation of this patient.Benny Lennert, MD 04/09/13 [phone]

## 2013-04-09 NOTE — ED Notes (Signed)
Patient stated "the pain medicine did not help at all. It seemed to make my chest hurt more."

## 2013-04-09 NOTE — Telephone Encounter (Signed)
C/o sharp pain on right side of breast, "really bad migraine", abdominal pain last night due to constipation. No vaginal bleeding. Pt encouraged to take OTC tylenol for pain, push water 6-8 glasses per day, prune juice, foods high in fiber. If no improvement pt to call office back. Pt verbalized understanding.

## 2013-04-09 NOTE — Discharge Instructions (Signed)
Record blood sugar before breakfast and dinner and follow up with your md.  Tylenol for pain

## 2013-04-09 NOTE — ED Notes (Signed)
1. Pt reports headache that she woke up with this morning, took some tylenol w/ no relief. Denies any nausea, vomiting or diarrhea. 2. Pt reports pain to chest that started on right side of chest wall and radiated to center of chest -pain started around 12:30 today. Denies any sob, denies any cough or congestion. Denies any known injury.  3. Pt is [redacted] weeks pregnant.

## 2013-04-09 NOTE — ED Notes (Signed)
Discussed with EDP concerning giving dilaudid during pregnancy. EDP reported okay to give to pregnant women in small doses. Pt advised on potential risks associated with receiving dilaudid during pregnancy. Pt reported she still wanted to receive dilaudid for pain.

## 2013-04-14 IMAGING — US US OB TRANSVAGINAL
1 series · 14 of 28 positions shown · non-contrast
Comparison: None.

CLINICAL DATA: Lower quadrant abdominal pain.

OBSTETRIC <14 WK US AND TRANSVAGINAL OB US
TECHNIQUE: Both transabdominal and transvaginal ultrasound
examinations were performed for complete evaluation of the
gestation as well as the maternal uterus, adnexal regions, and
pelvic cul-de-sac.  Transvaginal technique was performed to assess
early pregnancy.

[Series 1: us ob transvaginal · 0.25mm/px · 14 of 59 slices shown]
[im 3/59]
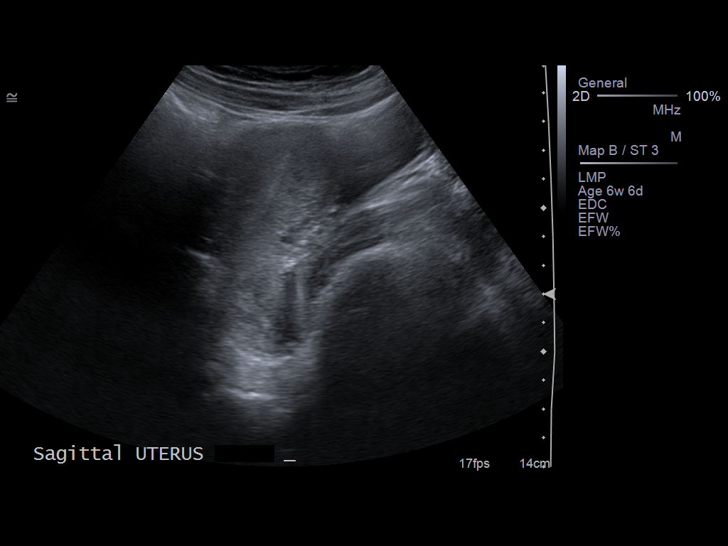
[im 7/59]
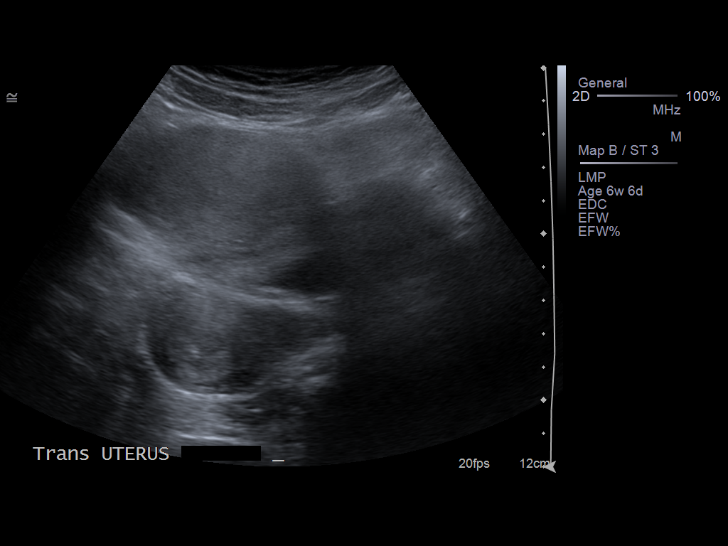
[im 11/59]
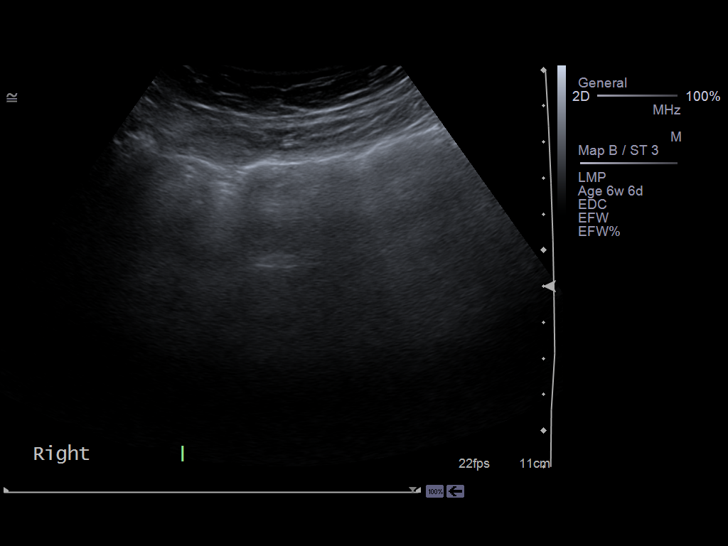
[im 16/59]
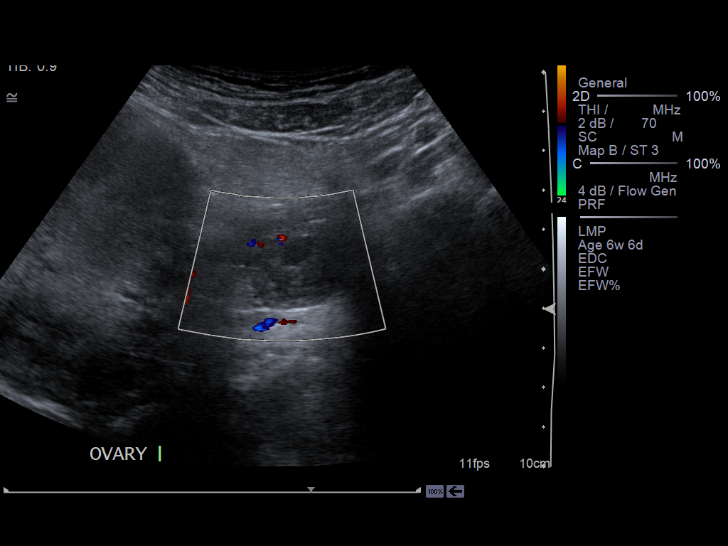
[im 20/59]
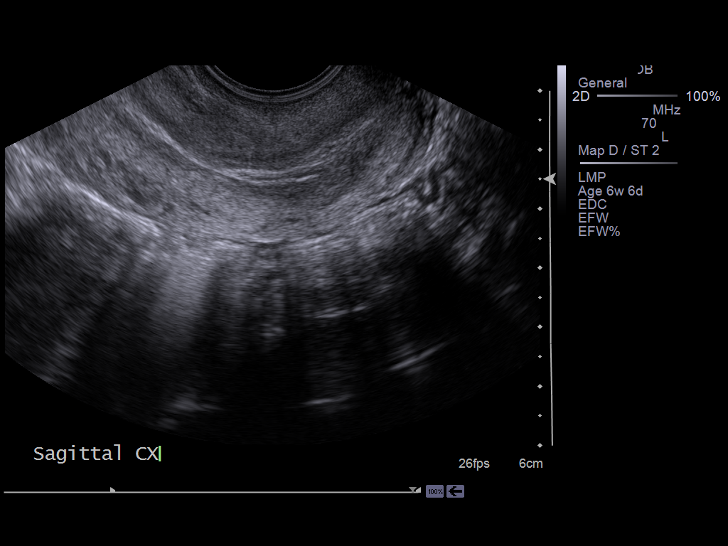
[im 24/59]
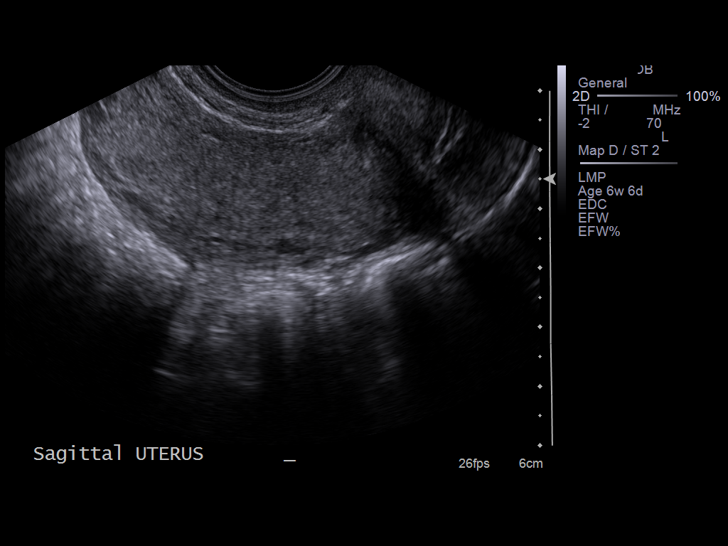
[im 28/59]
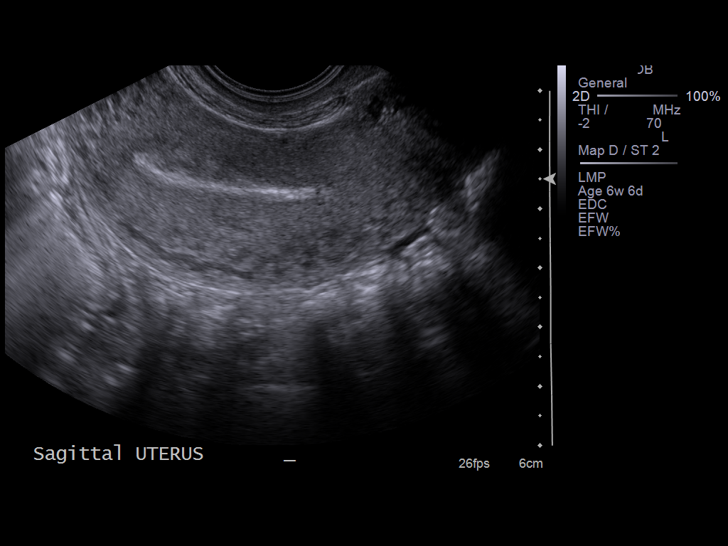
[im 33/59]
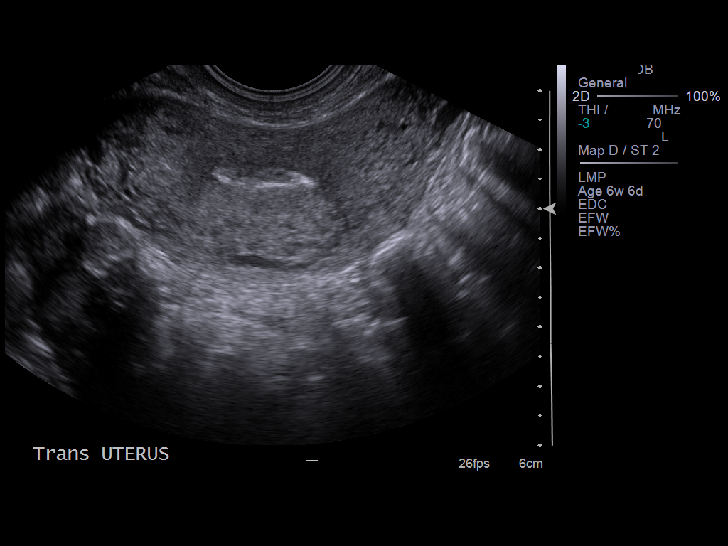
[im 37/59]
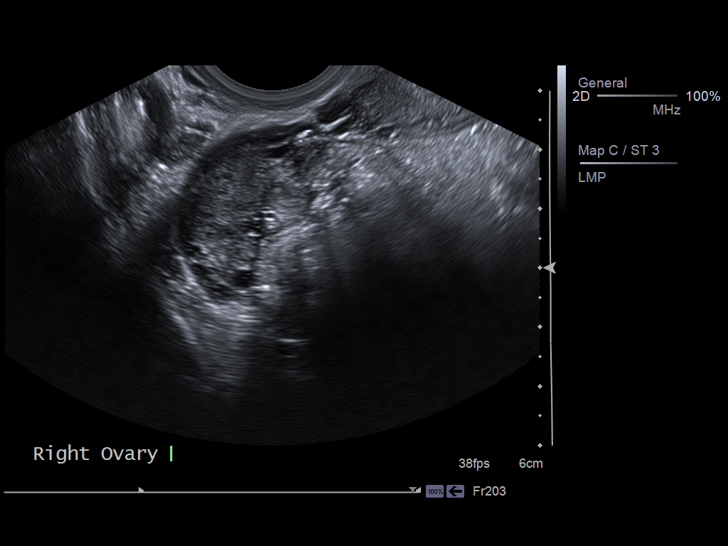
[im 41/59]
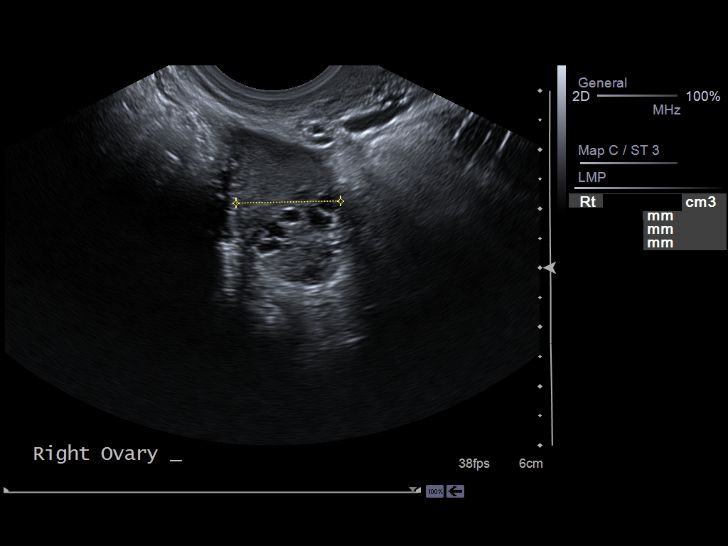
[im 46/59]
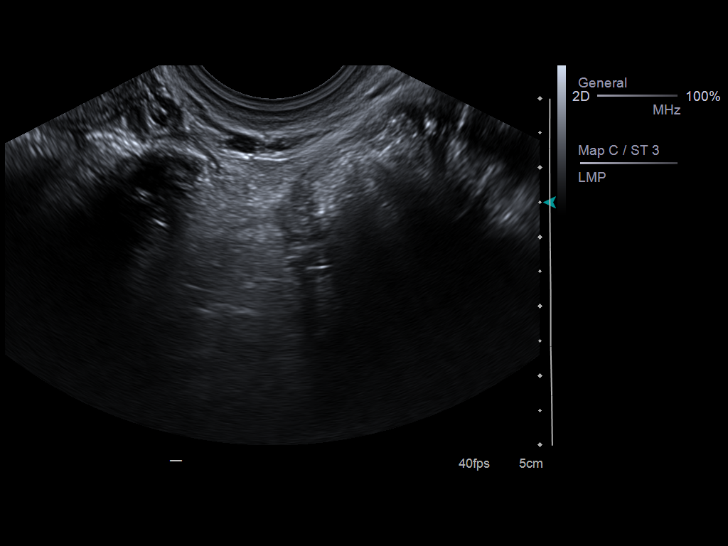
[im 50/59]
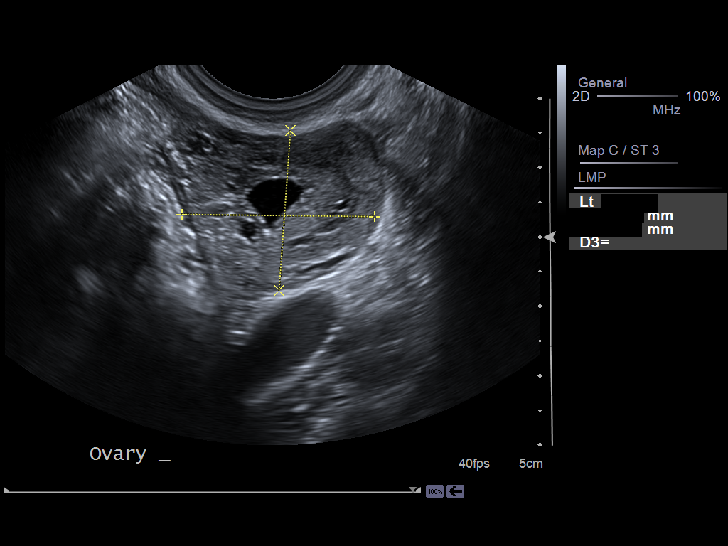
[im 54/59]
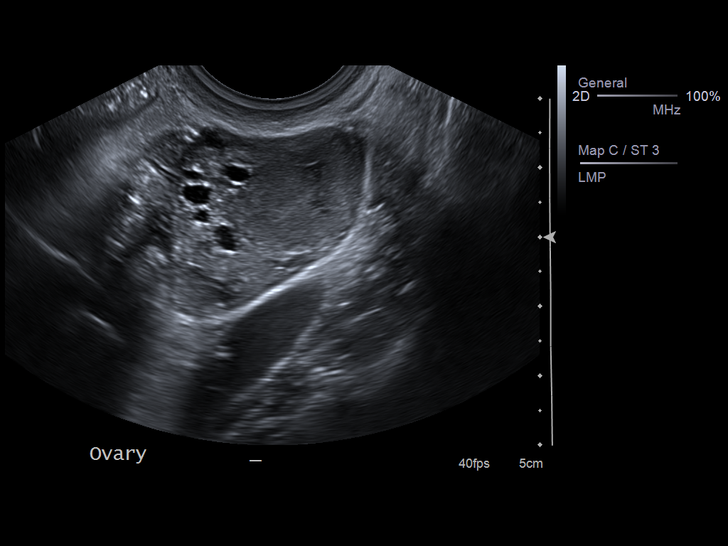
[im 59/59]
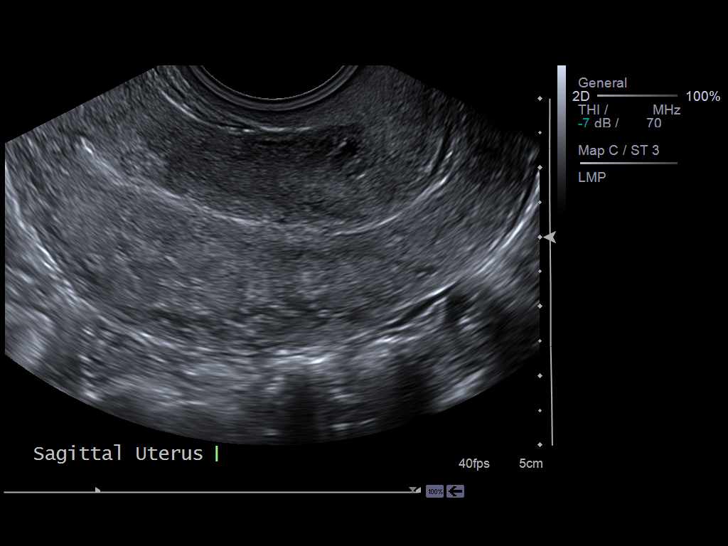

[14 of 28 positions shown; findings below may reference images not displayed]

Intrauterine gestational sac:  None visualized.
Yolk sac: Not visualized.
Embryo: Not visualized.
Cardiac Activity: Not visualized.

Maternal uterus/adnexae:
No evidence of subchorionic hemorrhage.  Ovaries are visualized and
unremarkable.  No free fluid.
IMPRESSION: No visualized intrauterine pregnancy.  Early IUP and ectopic
pregnancy cannot be excluded.

## 2013-04-23 ENCOUNTER — Encounter: Payer: Self-pay | Admitting: Obstetrics & Gynecology

## 2013-04-23 ENCOUNTER — Ambulatory Visit (INDEPENDENT_AMBULATORY_CARE_PROVIDER_SITE_OTHER): Payer: PRIVATE HEALTH INSURANCE | Admitting: Obstetrics & Gynecology

## 2013-04-23 ENCOUNTER — Ambulatory Visit (INDEPENDENT_AMBULATORY_CARE_PROVIDER_SITE_OTHER): Payer: PRIVATE HEALTH INSURANCE

## 2013-04-23 VITALS — BP 90/60 | Wt 129.0 lb

## 2013-04-23 DIAGNOSIS — O24912 Unspecified diabetes mellitus in pregnancy, second trimester: Secondary | ICD-10-CM

## 2013-04-23 DIAGNOSIS — Z3481 Encounter for supervision of other normal pregnancy, first trimester: Secondary | ICD-10-CM

## 2013-04-23 DIAGNOSIS — O4400 Placenta previa specified as without hemorrhage, unspecified trimester: Secondary | ICD-10-CM

## 2013-04-23 DIAGNOSIS — O4402 Placenta previa specified as without hemorrhage, second trimester: Secondary | ICD-10-CM

## 2013-04-23 DIAGNOSIS — O24919 Unspecified diabetes mellitus in pregnancy, unspecified trimester: Secondary | ICD-10-CM

## 2013-04-23 MED ORDER — BUTALBITAL-APAP-CAFFEINE 50-325-40 MG PO TABS: 1.0000 | ORAL_TABLET | Freq: Four times a day (QID) | ORAL | Status: DC | PRN

## 2013-04-23 NOTE — Progress Notes (Signed)
HAD U/S TODAY. 

## 2013-04-23 NOTE — Progress Notes (Signed)
Limited scan. NT screen not performed d/t advanced gestational age. CRL59mm @[redacted]w[redacted]d , fluid appears nml, FHT@ 163 bpm, cx long and closed, anrterior placenta noted to be 0.7 cm from internal cx os, c/w low lying placenta at this early scan. Nasal Bone present, bilat ovs seen

## 2013-04-23 NOTE — Patient Instructions (Addendum)
Pregnancy - Second Trimester The second trimester of pregnancy (3 to 6 months) is a period of rapid growth for you and your baby. At the end of the sixth month, your baby is about 9 inches long and weighs 1 1/2 pounds. You will begin to feel the baby move between 18 and 20 weeks of the pregnancy. This is called quickening. Weight gain is faster. A clear fluid (colostrum) may leak out of your breasts. You may feel small contractions of the womb (uterus). This is known as false labor or Braxton-Hicks contractions. This is like a practice for labor when the baby is ready to be born. Usually, the problems with morning sickness have usually passed by the end of your first trimester. Some women develop small dark blotches (called cholasma, mask of pregnancy) on their face that usually goes away after the baby is born. Exposure to the sun makes the blotches worse. Acne may also develop in some pregnant women and pregnant women who have acne, may find that it goes away. PRENATAL EXAMS  Blood work may continue to be done during prenatal exams. These tests are done to check on your health and the probable health of your baby. Blood work is used to follow your blood levels (hemoglobin). Anemia (low hemoglobin) is common during pregnancy. Iron and vitamins are given to help prevent this. You will also be checked for diabetes between 24 and 28 weeks of the pregnancy. Some of the previous blood tests may be repeated.  The size of the uterus is measured during each visit. This is to make sure that the baby is continuing to grow properly according to the dates of the pregnancy.  Your blood pressure is checked every prenatal visit. This is to make sure you are not getting toxemia.  Your urine is checked to make sure you do not have an infection, diabetes or protein in the urine.  Your weight is checked often to make sure gains are happening at the suggested rate. This is to ensure that both you and your baby are  growing normally.  Sometimes, an ultrasound is performed to confirm the proper growth and development of the baby. This is a test which bounces harmless sound waves off the baby so your caregiver can more accurately determine due dates. Sometimes, a test is done on the amniotic fluid surrounding the baby. This test is called an amniocentesis. The amniotic fluid is obtained by sticking a needle into the belly (abdomen). This is done to check the chromosomes in instances where there is a concern about possible genetic problems with the baby. It is also sometimes done near the end of pregnancy if an early delivery is required. In this case, it is done to help make sure the baby's lungs are mature enough for the baby to live outside of the womb. CHANGES OCCURING IN THE SECOND TRIMESTER OF PREGNANCY Your body goes through many changes during pregnancy. They vary from person to person. Talk to your caregiver about changes you notice that you are concerned about.  During the second trimester, you will likely have an increase in your appetite. It is normal to have cravings for certain foods. This varies from person to person and pregnancy to pregnancy.  Your lower abdomen will begin to bulge.  You may have to urinate more often because the uterus and baby are pressing on your bladder. It is also common to get more bladder infections during pregnancy. You can help this by drinking lots of fluids   and emptying your bladder before and after intercourse.  You may begin to get stretch marks on your hips, abdomen, and breasts. These are normal changes in the body during pregnancy. There are no exercises or medicines to take that prevent this change.  You may begin to develop swollen and bulging veins (varicose veins) in your legs. Wearing support hose, elevating your feet for 15 minutes, 3 to 4 times a day and limiting salt in your diet helps lessen the problem.  Heartburn may develop as the uterus grows and  pushes up against the stomach. Antacids recommended by your caregiver helps with this problem. Also, eating smaller meals 4 to 5 times a day helps.  Constipation can be treated with a stool softener or adding bulk to your diet. Drinking lots of fluids, and eating vegetables, fruits, and whole grains are helpful.  Exercising is also helpful. If you have been very active up until your pregnancy, most of these activities can be continued during your pregnancy. If you have been less active, it is helpful to start an exercise program such as walking.  Hemorrhoids may develop at the end of the second trimester. Warm sitz baths and hemorrhoid cream recommended by your caregiver helps hemorrhoid problems.  Backaches may develop during this time of your pregnancy. Avoid heavy lifting, wear low heal shoes, and practice good posture to help with backache problems.  Some pregnant women develop tingling and numbness of their hand and fingers because of swelling and tightening of ligaments in the wrist (carpel tunnel syndrome). This goes away after the baby is born.  As your breasts enlarge, you may have to get a bigger bra. Get a comfortable, cotton, support bra. Do not get a nursing bra until the last month of the pregnancy if you will be nursing the baby.  You may get a dark line from your belly button to the pubic area called the linea nigra.  You may develop rosy cheeks because of increase blood flow to the face.  You may develop spider looking lines of the face, neck, arms, and chest. These go away after the baby is born. HOME CARE INSTRUCTIONS   It is extremely important to avoid all smoking, herbs, alcohol, and unprescribed drugs during your pregnancy. These chemicals affect the formation and growth of the baby. Avoid these chemicals throughout the pregnancy to ensure the delivery of a healthy infant.  Most of your home care instructions are the same as suggested for the first trimester of your  pregnancy. Keep your caregiver's appointments. Follow your caregiver's instructions regarding medicine use, exercise, and diet.  During pregnancy, you are providing food for you and your baby. Continue to eat regular, well-balanced meals. Choose foods such as meat, fish, milk and other low fat dairy products, vegetables, fruits, and whole-grain breads and cereals. Your caregiver will tell you of the ideal weight gain.  A physical sexual relationship may be continued up until near the end of pregnancy if there are no other problems. Problems could include early (premature) leaking of amniotic fluid from the membranes, vaginal bleeding, abdominal pain, or other medical or pregnancy problems.  Exercise regularly if there are no restrictions. Check with your caregiver if you are unsure of the safety of some of your exercises. The greatest weight gain will occur in the last 2 trimesters of pregnancy. Exercise will help you:  Control your weight.  Get you in shape for labor and delivery.  Lose weight after you have the baby.  Wear   a good support or jogging bra for breast tenderness during pregnancy. This may help if worn during sleep. Pads or tissues may be used in the bra if you are leaking colostrum.  Do not use hot tubs, steam rooms or saunas throughout the pregnancy.  Wear your seat belt at all times when driving. This protects you and your baby if you are in an accident.  Avoid raw meat, uncooked cheese, cat litter boxes, and soil used by cats. These carry germs that can cause birth defects in the baby.  The second trimester is also a good time to visit your dentist for your dental health if this has not been done yet. Getting your teeth cleaned is okay. Use a soft toothbrush. Brush gently during pregnancy.  It is easier to leak urine during pregnancy. Tightening up and strengthening the pelvic muscles will help with this problem. Practice stopping your urination while you are going to the  bathroom. These are the same muscles you need to strengthen. It is also the muscles you would use as if you were trying to stop from passing gas. You can practice tightening these muscles up 10 times a set and repeating this about 3 times per day. Once you know what muscles to tighten up, do not perform these exercises during urination. It is more likely to contribute to an infection by backing up the urine.  Ask for help if you have financial, counseling, or nutritional needs during pregnancy. Your caregiver will be able to offer counseling for these needs as well as refer you for other special needs.  Your skin may become oily. If so, wash your face with mild soap, use non-greasy moisturizer and oil or cream based makeup. MEDICINES AND DRUG USE IN PREGNANCY  Take prenatal vitamins as directed. The vitamin should contain 1 milligram of folic acid. Keep all vitamins out of reach of children. Only a couple vitamins or tablets containing iron may be fatal to a baby or young child when ingested.  Avoid use of all medicines, including herbs, over-the-counter medicines, not prescribed or suggested by your caregiver. Only take over-the-counter or prescription medicines for pain, discomfort, or fever as directed by your caregiver. Do not use aspirin.  Let your caregiver also know about herbs you may be using.  Alcohol is related to a number of birth defects. This includes fetal alcohol syndrome. All alcohol, in any form, should be avoided completely. Smoking will cause low birth rate and premature babies.  Street or illegal drugs are very harmful to the baby. They are absolutely forbidden. A baby born to an addicted mother will be addicted at birth. The baby will go through the same withdrawal an adult does. SEEK MEDICAL CARE IF:  You have any concerns or worries during your pregnancy. It is better to call with your questions if you feel they cannot wait, rather than worry about them. SEEK IMMEDIATE  MEDICAL CARE IF:   An unexplained oral temperature above 102 F (38.9 C) develops, or as your caregiver suggests.  You have leaking of fluid from the vagina (birth canal). If leaking membranes are suspected, take your temperature and tell your caregiver of this when you call.  There is vaginal spotting, bleeding, or passing clots. Tell your caregiver of the amount and how many pads are used. Light spotting in pregnancy is common, especially following intercourse.  You develop a bad smelling vaginal discharge with a change in the color from clear to white.  You continue to feel   sick to your stomach (nauseated) and have no relief from remedies suggested. You vomit blood or coffee ground-like materials.  You lose more than 2 pounds of weight or gain more than 2 pounds of weight over 1 week, or as suggested by your caregiver.  You notice swelling of your face, hands, feet, or legs.  You get exposed to German measles and have never had them.  You are exposed to fifth disease or chickenpox.  You develop belly (abdominal) pain. Round ligament discomfort is a common non-cancerous (benign) cause of abdominal pain in pregnancy. Your caregiver still must evaluate you.  You develop a bad headache that does not go away.  You develop fever, diarrhea, pain with urination, or shortness of breath.  You develop visual problems, blurry, or double vision.  You fall or are in a car accident or any kind of trauma.  There is mental or physical violence at home. Document Released: 09/14/2001 Document Revised: 06/14/2012 Document Reviewed: 03/19/2009 [employer] Patient Information 2014 [employer].  

## 2013-04-23 NOTE — Progress Notes (Signed)
Sonogram noted normal, no blood sugars today Will keep a log, until the medtronic plug in is loaded back

## 2013-05-07 ENCOUNTER — Encounter: Payer: Self-pay | Admitting: Obstetrics & Gynecology

## 2013-05-07 ENCOUNTER — Ambulatory Visit (INDEPENDENT_AMBULATORY_CARE_PROVIDER_SITE_OTHER): Payer: PRIVATE HEALTH INSURANCE | Admitting: Obstetrics & Gynecology

## 2013-05-07 VITALS — BP 90/60 | Wt 128.0 lb

## 2013-05-07 DIAGNOSIS — O09219 Supervision of pregnancy with history of pre-term labor, unspecified trimester: Secondary | ICD-10-CM

## 2013-05-07 DIAGNOSIS — O24919 Unspecified diabetes mellitus in pregnancy, unspecified trimester: Secondary | ICD-10-CM

## 2013-05-07 DIAGNOSIS — O09299 Supervision of pregnancy with other poor reproductive or obstetric history, unspecified trimester: Secondary | ICD-10-CM

## 2013-05-07 DIAGNOSIS — O99340 Other mental disorders complicating pregnancy, unspecified trimester: Secondary | ICD-10-CM

## 2013-05-07 DIAGNOSIS — Z1389 Encounter for screening for other disorder: Secondary | ICD-10-CM

## 2013-05-07 DIAGNOSIS — Z331 Pregnant state, incidental: Secondary | ICD-10-CM

## 2013-05-07 DIAGNOSIS — E1065 Type 1 diabetes mellitus with hyperglycemia: Secondary | ICD-10-CM

## 2013-05-07 LAB — POCT URINALYSIS DIPSTICK
Blood, UA: NEGATIVE
Glucose, UA: 3

## 2013-05-07 NOTE — Progress Notes (Signed)
BS awful.  Pump changes:  Carb sensitivity 1:10, 12a-6a 0.9, 6a-2p 1.1, 2p-12a 1.0 BP weight and urine results all reviewed and noted. Patient reports good fetal movement, denies any bleeding and no rupture of membranes symptoms or regular contractions. Patient is without complaints. All questions were answered.

## 2013-05-14 ENCOUNTER — Ambulatory Visit (INDEPENDENT_AMBULATORY_CARE_PROVIDER_SITE_OTHER): Payer: PRIVATE HEALTH INSURANCE | Admitting: Obstetrics & Gynecology

## 2013-05-14 ENCOUNTER — Encounter: Payer: Self-pay | Admitting: Obstetrics & Gynecology

## 2013-05-14 VITALS — BP 110/60 | Wt 133.0 lb

## 2013-05-14 DIAGNOSIS — E1065 Type 1 diabetes mellitus with hyperglycemia: Secondary | ICD-10-CM

## 2013-05-14 DIAGNOSIS — O09299 Supervision of pregnancy with other poor reproductive or obstetric history, unspecified trimester: Secondary | ICD-10-CM

## 2013-05-14 DIAGNOSIS — O24919 Unspecified diabetes mellitus in pregnancy, unspecified trimester: Secondary | ICD-10-CM

## 2013-05-14 DIAGNOSIS — Z1389 Encounter for screening for other disorder: Secondary | ICD-10-CM

## 2013-05-14 DIAGNOSIS — O99340 Other mental disorders complicating pregnancy, unspecified trimester: Secondary | ICD-10-CM

## 2013-05-14 DIAGNOSIS — O09219 Supervision of pregnancy with history of pre-term labor, unspecified trimester: Secondary | ICD-10-CM

## 2013-05-14 DIAGNOSIS — Z331 Pregnant state, incidental: Secondary | ICD-10-CM

## 2013-05-14 LAB — POCT URINALYSIS DIPSTICK: Ketones, UA: NEGATIVE

## 2013-05-14 NOTE — Progress Notes (Signed)
BS better still not good. New Pump settings:   12A-6A  09.; 6A-2P 1.3; 2P-12A 1.2  Carb sensitivity 1:8 Follow up 1 week for ob visit, 2 weeks sonogram

## 2013-05-14 NOTE — Addendum Note (Signed)
Addended by: Colen Darling on: 05/14/2013 10:52 AM   Modules accepted: Orders

## 2013-05-21 ENCOUNTER — Ambulatory Visit (INDEPENDENT_AMBULATORY_CARE_PROVIDER_SITE_OTHER): Payer: PRIVATE HEALTH INSURANCE | Admitting: Obstetrics & Gynecology

## 2013-05-21 ENCOUNTER — Encounter: Payer: Self-pay | Admitting: Obstetrics & Gynecology

## 2013-05-21 VITALS — BP 90/60 | Wt 134.0 lb

## 2013-05-21 DIAGNOSIS — O09219 Supervision of pregnancy with history of pre-term labor, unspecified trimester: Secondary | ICD-10-CM

## 2013-05-21 DIAGNOSIS — O09299 Supervision of pregnancy with other poor reproductive or obstetric history, unspecified trimester: Secondary | ICD-10-CM

## 2013-05-21 DIAGNOSIS — O24919 Unspecified diabetes mellitus in pregnancy, unspecified trimester: Secondary | ICD-10-CM

## 2013-05-21 DIAGNOSIS — Z1389 Encounter for screening for other disorder: Secondary | ICD-10-CM

## 2013-05-21 DIAGNOSIS — O99340 Other mental disorders complicating pregnancy, unspecified trimester: Secondary | ICD-10-CM

## 2013-05-21 DIAGNOSIS — Z331 Pregnant state, incidental: Secondary | ICD-10-CM

## 2013-05-21 LAB — POCT URINALYSIS DIPSTICK
Bilirubin, UA: NEGATIVE
Ketones, UA: NEGATIVE
Leukocytes, UA: NEGATIVE
Protein, UA: NEGATIVE

## 2013-05-21 NOTE — Progress Notes (Signed)
New Pump settings:  Carb sens 1:6, 12A-6A 0.8, 6A-2P 1.5, 2P-12A 1.4 Sugars getting better but still need to continue to make slow incremental changes BP weight and urine results all reviewed and noted. Patient reports good fetal movement, denies any bleeding and no rupture of membranes symptoms or regular contractions. Patient is without complaints. All questions were answered.

## 2013-05-29 ENCOUNTER — Ambulatory Visit (INDEPENDENT_AMBULATORY_CARE_PROVIDER_SITE_OTHER): Payer: PRIVATE HEALTH INSURANCE | Admitting: Obstetrics & Gynecology

## 2013-05-29 ENCOUNTER — Other Ambulatory Visit: Payer: Self-pay | Admitting: Obstetrics & Gynecology

## 2013-05-29 ENCOUNTER — Encounter: Payer: Self-pay | Admitting: Obstetrics & Gynecology

## 2013-05-29 ENCOUNTER — Ambulatory Visit (INDEPENDENT_AMBULATORY_CARE_PROVIDER_SITE_OTHER): Payer: PRIVATE HEALTH INSURANCE

## 2013-05-29 VITALS — BP 100/60 | Wt 136.0 lb

## 2013-05-29 DIAGNOSIS — O09219 Supervision of pregnancy with history of pre-term labor, unspecified trimester: Secondary | ICD-10-CM

## 2013-05-29 DIAGNOSIS — O24919 Unspecified diabetes mellitus in pregnancy, unspecified trimester: Secondary | ICD-10-CM

## 2013-05-29 DIAGNOSIS — Z1389 Encounter for screening for other disorder: Secondary | ICD-10-CM

## 2013-05-29 DIAGNOSIS — O09299 Supervision of pregnancy with other poor reproductive or obstetric history, unspecified trimester: Secondary | ICD-10-CM

## 2013-05-29 DIAGNOSIS — E1065 Type 1 diabetes mellitus with hyperglycemia: Secondary | ICD-10-CM

## 2013-05-29 DIAGNOSIS — Z331 Pregnant state, incidental: Secondary | ICD-10-CM

## 2013-05-29 DIAGNOSIS — O99340 Other mental disorders complicating pregnancy, unspecified trimester: Secondary | ICD-10-CM

## 2013-05-29 LAB — POCT URINALYSIS DIPSTICK
Glucose, UA: 4
Leukocytes, UA: NEGATIVE
Nitrite, UA: NEGATIVE

## 2013-05-29 NOTE — Progress Notes (Signed)
Had U/S Today. 

## 2013-05-29 NOTE — Progress Notes (Signed)
Sonogram today is normal, fetal echo +/- 24 weeks.  Normal today  Continue to adjust pump settings. BP weight and urine results all reviewed and noted. Patient reports good fetal movement, denies any bleeding and no rupture of membranes symptoms or regular contractions. Patient is without complaints. All questions were answered.

## 2013-05-29 NOTE — Progress Notes (Signed)
U/S(20+5wks)-active fetus, meas c/w dates, fluid wnl, no major abnl noted, cx long and closed, bilateral adnexa wnl, female fetus

## 2013-06-07 ENCOUNTER — Ambulatory Visit (INDEPENDENT_AMBULATORY_CARE_PROVIDER_SITE_OTHER): Payer: PRIVATE HEALTH INSURANCE | Admitting: Obstetrics & Gynecology

## 2013-06-07 ENCOUNTER — Encounter: Payer: Self-pay | Admitting: Obstetrics & Gynecology

## 2013-06-07 VITALS — BP 90/60 | Wt 138.0 lb

## 2013-06-07 DIAGNOSIS — Z331 Pregnant state, incidental: Secondary | ICD-10-CM

## 2013-06-07 DIAGNOSIS — E1065 Type 1 diabetes mellitus with hyperglycemia: Secondary | ICD-10-CM

## 2013-06-07 DIAGNOSIS — O09299 Supervision of pregnancy with other poor reproductive or obstetric history, unspecified trimester: Secondary | ICD-10-CM

## 2013-06-07 DIAGNOSIS — O09219 Supervision of pregnancy with history of pre-term labor, unspecified trimester: Secondary | ICD-10-CM

## 2013-06-07 DIAGNOSIS — O24919 Unspecified diabetes mellitus in pregnancy, unspecified trimester: Secondary | ICD-10-CM

## 2013-06-07 DIAGNOSIS — O99340 Other mental disorders complicating pregnancy, unspecified trimester: Secondary | ICD-10-CM

## 2013-06-07 DIAGNOSIS — Z1389 Encounter for screening for other disorder: Secondary | ICD-10-CM

## 2013-06-07 LAB — POCT URINALYSIS DIPSTICK
Blood, UA: NEGATIVE
Nitrite, UA: NEGATIVE

## 2013-06-07 NOTE — Progress Notes (Signed)
Increase pump settings to Carbsens 1:4, 12-6a q 1.0, 6a-2p 1.8, 2p-12a 1.8 BP weight and urine results all reviewed and noted. Patient reports good fetal movement, denies any bleeding and no rupture of membranes symptoms or regular contractions. Patient is without complaints. All questions were answered.

## 2013-06-21 ENCOUNTER — Encounter: Payer: PRIVATE HEALTH INSURANCE | Admitting: Obstetrics & Gynecology

## 2013-06-22 ENCOUNTER — Encounter: Payer: Self-pay | Admitting: Obstetrics & Gynecology

## 2013-06-22 ENCOUNTER — Other Ambulatory Visit: Payer: Self-pay

## 2013-06-22 ENCOUNTER — Observation Stay (HOSPITAL_COMMUNITY)
Admission: EM | Admit: 2013-06-22 | Discharge: 2013-06-23 | Disposition: A | Discharge status: Home / Self Care | Payer: Medicaid Other | Attending: Obstetrics and Gynecology | Admitting: Obstetrics and Gynecology

## 2013-06-22 ENCOUNTER — Encounter (HOSPITAL_COMMUNITY): Payer: Self-pay | Admitting: Emergency Medicine

## 2013-06-22 ENCOUNTER — Ambulatory Visit (INDEPENDENT_AMBULATORY_CARE_PROVIDER_SITE_OTHER): Payer: PRIVATE HEALTH INSURANCE | Admitting: Obstetrics & Gynecology

## 2013-06-22 VITALS — BP 100/60 | Wt 145.0 lb

## 2013-06-22 DIAGNOSIS — O24913 Unspecified diabetes mellitus in pregnancy, third trimester: Secondary | ICD-10-CM

## 2013-06-22 DIAGNOSIS — O99340 Other mental disorders complicating pregnancy, unspecified trimester: Secondary | ICD-10-CM

## 2013-06-22 DIAGNOSIS — R0609 Other forms of dyspnea: Secondary | ICD-10-CM | POA: Insufficient documentation

## 2013-06-22 DIAGNOSIS — Z794 Long term (current) use of insulin: Secondary | ICD-10-CM | POA: Insufficient documentation

## 2013-06-22 DIAGNOSIS — O24919 Unspecified diabetes mellitus in pregnancy, unspecified trimester: Secondary | ICD-10-CM | POA: Insufficient documentation

## 2013-06-22 DIAGNOSIS — E1065 Type 1 diabetes mellitus with hyperglycemia: Secondary | ICD-10-CM

## 2013-06-22 DIAGNOSIS — O09299 Supervision of pregnancy with other poor reproductive or obstetric history, unspecified trimester: Secondary | ICD-10-CM

## 2013-06-22 DIAGNOSIS — R0989 Other specified symptoms and signs involving the circulatory and respiratory systems: Secondary | ICD-10-CM | POA: Insufficient documentation

## 2013-06-22 DIAGNOSIS — O09219 Supervision of pregnancy with history of pre-term labor, unspecified trimester: Secondary | ICD-10-CM

## 2013-06-22 DIAGNOSIS — R079 Chest pain, unspecified: Secondary | ICD-10-CM | POA: Insufficient documentation

## 2013-06-22 DIAGNOSIS — J029 Acute pharyngitis, unspecified: Secondary | ICD-10-CM | POA: Insufficient documentation

## 2013-06-22 DIAGNOSIS — Z1389 Encounter for screening for other disorder: Secondary | ICD-10-CM

## 2013-06-22 DIAGNOSIS — E109 Type 1 diabetes mellitus without complications: Secondary | ICD-10-CM | POA: Insufficient documentation

## 2013-06-22 DIAGNOSIS — O99891 Other specified diseases and conditions complicating pregnancy: Principal | ICD-10-CM | POA: Insufficient documentation

## 2013-06-22 DIAGNOSIS — Z331 Pregnant state, incidental: Secondary | ICD-10-CM

## 2013-06-22 DIAGNOSIS — J988 Other specified respiratory disorders: Secondary | ICD-10-CM | POA: Insufficient documentation

## 2013-06-22 DIAGNOSIS — R509 Fever, unspecified: Secondary | ICD-10-CM

## 2013-06-22 LAB — URINE MICROSCOPIC-ADD ON

## 2013-06-22 LAB — CBC
MCH: 33.6 pg (ref 26.0–34.0)
MCV: 92.1 fL (ref 78.0–100.0)
Platelets: 260 10*3/uL (ref 150–400)
RDW: 12.1 % (ref 11.5–15.5)

## 2013-06-22 LAB — POCT URINALYSIS DIPSTICK: Blood, UA: NEGATIVE

## 2013-06-22 LAB — BASIC METABOLIC PANEL
Calcium: 9.3 mg/dL (ref 8.4–10.5)
Creatinine, Ser: 0.48 mg/dL — ABNORMAL LOW (ref 0.50–1.10)
GFR calc Af Amer: >90 mL/min (ref >90)

## 2013-06-22 LAB — URINALYSIS, ROUTINE W REFLEX MICROSCOPIC
Ketones, ur: NEGATIVE mg/dL
Nitrite: NEGATIVE
Protein, ur: NEGATIVE mg/dL

## 2013-06-22 MED ORDER — DEXTROSE 50 % IV SOLN: 50.0000 mL | Freq: Once | INTRAVENOUS | Status: DC

## 2013-06-22 MED ORDER — ACETAMINOPHEN 325 MG PO TABS
650.0000 mg | ORAL_TABLET | Freq: Once | ORAL | Status: AC
  Administered 2013-06-23: 650 mg via ORAL
  Filled 2013-06-22: qty 2

## 2013-06-22 MED ORDER — SODIUM CHLORIDE 0.9 % IV BOLUS (SEPSIS)
1000.0000 mL | Freq: Once | INTRAVENOUS | Status: AC
  Administered 2013-06-23: 1000 mL via INTRAVENOUS

## 2013-06-22 NOTE — ED Notes (Addendum)
Pt. reports mid chest pain / left lateral ribcage pain onset this evening , also reported fever (102.00 )  at home this evening , slight SOB , no nausea. Pt. stated she is [redacted] weeks pregnant ( G3 P1 ) . Pt. also stated sore throat. Denies abdominal cramping or vaginal bleeding.

## 2013-06-22 NOTE — Progress Notes (Addendum)
Pt to [facility] with chest pain, sore throat and fever.  Pt states temperature at home was 102*F.  Pt reports being seen @ [facility] this am and only had sore throat then.  Pt reports having chest pain starting around 9 pm.  Pt reports good fetal movement, no leaking of fluid or bleeding.  Pt has Type 1 diabetes and has insulin pump.  Pt reports no complications with this pregnancy so far.  Last pregnancy pt reports PIH and PTD @ 35 weeks.

## 2013-06-22 NOTE — Progress Notes (Signed)
Carbs: 1:4, 12a-6a 1.0, 6a-12p 2.0, 2p-12a 2.0 Blood sugars are good, need to increase a bit BP weight and urine results all reviewed and noted. Patient reports good fetal movement, denies any bleeding and no rupture of membranes symptoms or regular contractions. Patient is without complaints. All questions were answered.

## 2013-06-23 ENCOUNTER — Emergency Department (HOSPITAL_COMMUNITY): Payer: Medicaid Other

## 2013-06-23 DIAGNOSIS — R509 Fever, unspecified: Secondary | ICD-10-CM

## 2013-06-23 DIAGNOSIS — R079 Chest pain, unspecified: Secondary | ICD-10-CM

## 2013-06-23 DIAGNOSIS — O9989 Other specified diseases and conditions complicating pregnancy, childbirth and the puerperium: Secondary | ICD-10-CM

## 2013-06-23 DIAGNOSIS — J988 Other specified respiratory disorders: Secondary | ICD-10-CM

## 2013-06-23 LAB — GLUCOSE, CAPILLARY: Glucose-Capillary: 168 mg/dL — ABNORMAL HIGH (ref 70–99)

## 2013-06-23 MED ORDER — IOHEXOL 350 MG/ML SOLN
100.0000 mL | Freq: Once | INTRAVENOUS | Status: AC | PRN
  Administered 2013-06-23: 100 mL via INTRAVENOUS

## 2013-06-23 MED ORDER — CALCIUM CARBONATE ANTACID 500 MG PO CHEW: 2.0000 | CHEWABLE_TABLET | ORAL | Status: DC | PRN

## 2013-06-23 MED ORDER — PRENATAL MULTIVITAMIN CH: 1.0000 | ORAL_TABLET | Freq: Every day | ORAL | Status: DC

## 2013-06-23 MED ORDER — DOCUSATE SODIUM 100 MG PO CAPS: 100.0000 mg | ORAL_CAPSULE | Freq: Every day | ORAL | Status: DC

## 2013-06-23 MED ORDER — ACETAMINOPHEN 325 MG PO TABS: 650.0000 mg | ORAL_TABLET | ORAL | Status: DC | PRN

## 2013-06-23 MED ORDER — ZOLPIDEM TARTRATE 5 MG PO TABS: 5.0000 mg | ORAL_TABLET | Freq: Every evening | ORAL | Status: DC | PRN

## 2013-06-23 NOTE — ED Provider Notes (Signed)
CSN: [id number]     Arrival date & time 06/22/13  2240 History   First MD Initiated Contact with Patient 06/22/13 2311     Chief Complaint  Patient presents with  . Chest Pain  . Fever   (Consider location/radiation/quality/duration/timing/severity/associated sxs/prior Treatment) HPI History provided by patient. is [redacted] weeks pregnant, not feeling well since yesterday he with low-grade fever. She saw OB yesterday in the clinic. Feeling worse tonight with elevated fever and some left-sided and substernal chest pain and shortness of breath. She has dry cough and some sore throat. No rash. No dominant pain. No vaginal bleeding. No back pain. Symptoms moderate in severity. Patient noted to have a heart rate of 146 with a temperature of 99.5 in triage - no history of DVT or PE Past Medical History  Diagnosis Date  . 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 Corneal abrasion   . Candidiasis   . Tubal pregnancy   . Diabetes mellitus     dx age 56  . Diabetes mellitus type I   . Urinary tract infection   . Pregnancy induced hypertension   . Bipolar disorder   . Pregnancy    Past Surgical History  Procedure Laterality Date  . Wisdom tooth extraction  2012   Family History  Problem Relation Age of Onset  . Anesthesia problems Neg Hx   . Cancer Maternal Grandmother     breast  . Hypertension Maternal Grandmother   . Cancer Maternal Grandfather     throat and lung   History  Substance Use Topics  . Smoking status: Current Every Day Smoker -- 0.50 packs/day for 2 years    Types: Cigarettes  . Smokeless tobacco: Never Used     Comment: quit with + preg  . Alcohol Use: No   OB History   Grav Para Term Preterm Abortions TAB SAB Ect Mult Living   3 1 0 1 1 0 0 1 0 1      Review of Systems  Constitutional: Positive for fever.  HENT: Negative for neck pain.   Eyes: Negative for visual disturbance.  Respiratory: Positive for shortness of breath.   Cardiovascular:  Positive for chest pain.  Gastrointestinal: Negative for abdominal pain.  Genitourinary: Negative for dysuria.  Musculoskeletal: Negative for back pain.  Skin: Negative for rash.  Neurological: Negative for headaches.  All other systems reviewed and are negative.    Allergies  Review of patient's allergies indicates no known allergies.  Home Medications   Current Outpatient Rx  Name  Route  Sig  Dispense  Refill  . acetaminophen (TYLENOL) 500 MG tablet   Oral   Take 500 mg by mouth every 6 (six) hours as needed for pain.         . butalbital-acetaminophen-caffeine (FIORICET) 50-325-40 MG per tablet   Oral   Take 1-2 tablets by mouth every 6 (six) hours as needed for headache.   20 tablet   0   . glucagon (GLUCAGON EMERGENCY) 1 MG injection   Intravenous   Inject 1 mg into the vein once as needed. For severe hypoglycemia.   2 each   prn   . insulin aspart (NOVOLOG) 100 UNIT/ML injection   Other   1 Units by Other route continuous. 1 unit for every 7 grams of carbs.          BP 112/76  Pulse 111  Temp(Src) 98.2 F (36.8 C) (Oral)  Resp 18  Ht  5' 5.5" (1.664 m)  Wt 145 lb (65.772 kg)  BMI 23.75 kg/m2  SpO2 99%  LMP 01/09/2013  Breastfeeding? No Physical Exam  Constitutional: She is oriented to person, place, and time. She appears well-developed and well-nourished.  HENT:  Head: Normocephalic and atraumatic.  Mouth/Throat: Oropharynx is clear and moist. No oropharyngeal exudate.  Eyes: EOM are normal. Pupils are equal, round, and reactive to light.  Neck: Normal range of motion. Neck supple.  Cardiovascular: Regular rhythm and intact distal pulses.   Tachycardic 140s  Pulmonary/Chest: Effort normal and breath sounds normal. No respiratory distress. She exhibits no tenderness.  Abdominal: Soft. There is no tenderness.  Musculoskeletal: Normal range of motion. She exhibits no edema.  Lymphadenopathy:    She has no cervical adenopathy.  Neurological: She is  alert and oriented to person, place, and time.  Skin: Skin is warm and dry.    ED Course  Procedures (including critical care time) Labs Review Labs Reviewed  CBC - Abnormal; Notable for the following:    WBC 13.5 (*)    MCHC 36.5 (*)    All other components within normal limits  BASIC METABOLIC PANEL - Abnormal; Notable for the following:    Potassium 3.4 (*)    Glucose, Bld 60 (*)    Creatinine, Ser 0.48 (*)    All other components within normal limits  URINALYSIS, ROUTINE W REFLEX MICROSCOPIC - Abnormal; Notable for the following:    Leukocytes, UA SMALL (*)    All other components within normal limits  URINE MICROSCOPIC-ADD ON - Abnormal; Notable for the following:    Squamous Epithelial / LPF MANY (*)    Bacteria, UA FEW (*)    All other components within normal limits  GLUCOSE, CAPILLARY - Abnormal; Notable for the following:    Glucose-Capillary 67 (*)    All other components within normal limits  GLUCOSE, CAPILLARY - Abnormal; Notable for the following:    Glucose-Capillary 173 (*)    All other components within normal limits  GLUCOSE, CAPILLARY - Abnormal; Notable for the following:    Glucose-Capillary 168 (*)    All other components within normal limits  RAPID STREP SCREEN  CULTURE, GROUP A STREP  URINE CULTURE  POCT I-STAT TROPONIN I   Imaging Review Dg Chest 2 View  06/23/2013   *RADIOLOGY REPORT*  Clinical Data: Cough, shortness of breath  CHEST - 2 VIEW  Comparison: Prior radiograph from 10/20/2012  Findings: Cardiac and mediastinal silhouettes are within normal limits.  Lungs are normally inflated.  No focal infiltrate to suggest an acute infectious pneumonitis is identified.  There is no pleural effusion or pulmonary edema.  No pneumothorax.  No acute osseous abnormality.  IMPRESSION: Normal radiograph the chest with no acute cardiopulmonary process identified.   Original Report Authenticated By: Rise Mu, M.D.   Ct Angio Chest Pe W/cm &/or Wo  Cm  06/23/2013   CLINICAL DATA:  Pregnant. Shortness of Breath, chest pain.  EXAM: CT ANGIOGRAPHY CHEST WITH CONTRAST  TECHNIQUE: Multidetector CT imaging of the chest was performed using the standard protocol during bolus administration of intravenous contrast. Multiplanar CT image reconstructions including MIPs were obtained to evaluate the vascular anatomy.  CONTRAST:  OMNIPAQUE IOHEXOL 350 MG/ML SOLN  COMPARISON:  05/24/2011  FINDINGS: Satisfactory opacification of pulmonary arteries noted, and there is no evidence of pulmonary emboli. Adequate contrast opacification of the thoracic aorta with no evidence of dissection, aneurysm, or stenosis. There is classic 3-vessel brachiocephalic arch anatomy without  proximal stenosis. Subcentimeter prevascular and precarinal lymph nodes. No hilar adenopathy. No pleural or pericardial effusion. Dependent atelectasis posteriorly in both lower lobes. Lungs otherwise clear. Visualized upper abdomen unremarkable. Thoracic spine and sternum unremarkable.  Review of the MIP images confirms the above findings.  IMPRESSION: Negative for acute PE or thoracic aortic dissection.   Electronically Signed   By: Oley Balm M.D.   On: 06/23/2013 04:39   Tylenol IV fluids provided. Symptomatically improving in the ED.  5:06 AM d/w OB Dr Emelda Fear, accepts in [place] to [facility] MAU MDM   1. Fever    Evaluated for infectious sources and PE as above. UA reviewed as contaminated and no UTI symptoms.   Transfer to [facility] hospital    Sunnie Nielsen, MD 06/23/13 [phone]

## 2013-06-23 NOTE — H&P (Signed)
FACULTY PRACTICE ANTEPARTUM ADMISSION HISTORY AND PHYSICAL NOTE   History of Present Illness: [name] is a 22 y.o. [id number] at [redacted]w[redacted]d admitted for chest pain and fever. Pt states that she developed a sore throat 3 days ago that has been progressively worsening.  Today she developed pain on the left chest wall and some difficulty breathing along with it.  She was having chills and feeling hot so checked her temperature and it was 102.8.  Given this, she went to [facility] where she was further evaluated.  She denies any ctx, vb, lof. +FM.   No nausea, vomiting, diarrhea, constipation, dysuria, hematuria, headaches, vision changes, weakness, numbness, wheezing.    In the ED, she was found to have a neg rapid strep, leukocytosis of 13.5 (no differential), neg UA for infection, BMP with a glucose of 60.  A CXR was done and negative. Given chest pain and tachycardia, a CT PE study was also obtained and was negative for PE.  She was then sent here for further management.    Patient Active Problem List   Diagnosis Date Noted  . Diabetes mellitus, antepartum 06/22/2013  . DM (diabetes mellitus), type 1, uncontrolled 01/26/2012  . Bipolar II disorder 01/20/2012    Class: Acute  . Tobacco abuse 12/24/2011     Past Medical History  Diagnosis Date  . 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 Corneal abrasion   . Candidiasis   . Tubal pregnancy   . Diabetes mellitus     dx age 62  . Diabetes mellitus type I   . Urinary tract infection   . Pregnancy induced hypertension   . Bipolar disorder   . Pregnancy      Past Surgical History  Procedure Laterality Date  . Wisdom tooth extraction  2012     OB History   Grav Para Term Preterm Abortions TAB SAB Ect Mult Living   3 1 0 1 1 0 0 1 0 1     G1- PTD @ 35weeks for PIH   History   Social History  . Marital Status: Single    Spouse Name: N/A    Number of Children: N/A  . Years of Education: N/A   Social  History Main Topics  . Smoking status: Current Every Day Smoker -- 0.50 packs/day for 2 years    Types: Cigarettes  . Smokeless tobacco: Never Used     Comment: quit with + preg  . Alcohol Use: No  . Drug Use: No  . Sexual Activity: Yes    Birth Control/ Protection: None   Other Topics Concern  . 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 Anesthesia problems Neg Hx   . Cancer Maternal Grandmother     breast  . Hypertension Maternal Grandmother   . Cancer Maternal Grandfather     throat and lung    No Known Allergies  Prescriptions prior to admission  Medication Sig Dispense Refill  . acetaminophen (TYLENOL) 500 MG tablet Take 500 mg by mouth every 6 (six) hours as needed for pain.      . butalbital-acetaminophen-caffeine (FIORICET) 50-325-40 MG per tablet Take 1-2 tablets by mouth every 6 (six) hours as needed for headache.  20 tablet  0  . glucagon (GLUCAGON EMERGENCY) 1 MG injection Inject 1 mg into the vein once  as needed. For severe hypoglycemia.  2 each  prn  . insulin aspart (NOVOLOG) 100 UNIT/ML injection 1 Units by Other route continuous. 1 unit for every 7 grams of carbs.         I have reviewed the patient's current medications.  Review of Systems - Negative except as stated in HPI  Vitals:  BP 110/70  Pulse 101  Temp(Src) 97.9 F (36.6 C) (Oral)  Resp 18  Ht 5' 5.5" (1.664 m)  Wt 65.772 kg (145 lb)  BMI 23.75 kg/m2  SpO2 99%  LMP 01/09/2013  Breastfeeding? No Physical Examination:  General appearance - alert, well appearing, and in no distress Chest - clear to auscultation, no wheezes, rales or rhonchi, symmetric air entry Heart - normal rate, regular rhythm, normal S1, S2, no murmurs, rubs, clicks or gallops Musculoskeletal - TTP along ribs 4-6 on the mid axillary line on the right reproducing her pain Abdomen: gravid and fundal height  is size equals  dates Pelvic Exam: examination not indicated Extremities: extremities normal, atraumatic, no cyanosis or edema and Homans sign is negative, no sign of DVT with DTRs 2+ bilaterally Fetal Monitoring: baseline 145 TOCO: quiet  Labs:  Results for orders placed during the hospital encounter of 06/22/13 (from the past 24 hour(s))  RAPID STREP SCREEN   Collection Time    06/22/13 10:53 PM      Result Value Range   Streptococcus, Group A Screen (Direct) NEGATIVE  NEGATIVE  CBC   Collection Time    06/22/13 11:01 PM      Result Value Range   WBC 13.5 (*) 4.0 - 10.5 K/uL   RBC 4.17  3.87 - 5.11 MIL/uL   Hemoglobin 14.0  12.0 - 15.0 g/dL   HCT 29.5  62.1 - 30.8 %   MCV 92.1  78.0 - 100.0 fL   MCH 33.6  26.0 - 34.0 pg   MCHC 36.5 (*) 30.0 - 36.0 g/dL   RDW 65.7  84.6 - 96.2 %   Platelets 260  150 - 400 K/uL  BASIC METABOLIC PANEL   Collection Time    06/22/13 11:01 PM      Result Value Range   Sodium 135  135 - 145 mEq/L   Potassium 3.4 (*) 3.5 - 5.1 mEq/L   Chloride 99  96 - 112 mEq/L   CO2 24  19 - 32 mEq/L   Glucose, Bld 60 (*) 70 - 99 mg/dL   BUN 13  6 - 23 mg/dL   Creatinine, Ser 9.52 (*) 0.50 - 1.10 mg/dL   Calcium 9.3  8.4 - 84.1 mg/dL   GFR calc non Af Amer >90  >90 mL/min   GFR calc Af Amer >90  >90 mL/min  URINALYSIS, ROUTINE W REFLEX MICROSCOPIC   Collection Time    06/22/13 11:01 PM      Result Value Range   Color, Urine YELLOW  YELLOW   APPearance CLEAR  CLEAR   Specific Gravity, Urine 1.021  1.005 - 1.030   pH 7.5  5.0 - 8.0   Glucose, UA NEGATIVE  NEGATIVE mg/dL   Hgb urine dipstick NEGATIVE  NEGATIVE   Bilirubin Urine NEGATIVE  NEGATIVE   Ketones, ur NEGATIVE  NEGATIVE mg/dL   Protein, ur NEGATIVE  NEGATIVE mg/dL   Urobilinogen, UA 0.2  0.0 - 1.0 mg/dL   Nitrite NEGATIVE  NEGATIVE   Leukocytes, UA SMALL (*) NEGATIVE  URINE MICROSCOPIC-ADD ON   Collection Time    06/22/13 11:01  PM      Result Value Range   Squamous Epithelial / LPF MANY (*) RARE   WBC,  UA 3-6  <3 WBC/hpf   Bacteria, UA FEW (*) RARE  POCT I-STAT TROPONIN I   Collection Time    06/22/13 11:06 PM      Result Value Range   Troponin i, poc 0.00  0.00 - 0.08 ng/mL   Comment 3           GLUCOSE, CAPILLARY   Collection Time    06/22/13 11:45 PM      Result Value Range   Glucose-Capillary 67 (*) 70 - 99 mg/dL  GLUCOSE, CAPILLARY   Collection Time    06/23/13  1:00 AM      Result Value Range   Glucose-Capillary 173 (*) 70 - 99 mg/dL  GLUCOSE, CAPILLARY   Collection Time    06/23/13  3:08 AM      Result Value Range   Glucose-Capillary 168 (*) 70 - 99 mg/dL  Results for orders placed in visit on 06/22/13 (from the past 24 hour(s))  POCT URINALYSIS DIPSTICK   Collection Time    06/22/13  8:52 AM      Result Value Range   Color, UA       Clarity, UA       Glucose, UA 4+     Bilirubin, UA       Ketones, UA neg     Spec Grav, UA       Blood, UA neg     pH, UA       Protein, UA trace     Urobilinogen, UA       Nitrite, UA neg     Leukocytes, UA Negative      Imaging Studies: Dg Chest 2 View  06/23/2013   *RADIOLOGY REPORT*  Clinical Data: Cough, shortness of breath  CHEST - 2 VIEW  Comparison: Prior radiograph from 10/20/2012  Findings: Cardiac and mediastinal silhouettes are within normal limits.  Lungs are normally inflated.  No focal infiltrate to suggest an acute infectious pneumonitis is identified.  There is no pleural effusion or pulmonary edema.  No pneumothorax.  No acute osseous abnormality.  IMPRESSION: Normal radiograph the chest with no acute cardiopulmonary process identified.   Original Report Authenticated By: Rise Mu, M.D.   Ct Angio Chest Pe W/cm &/or Wo Cm  06/23/2013   CLINICAL DATA:  Pregnant. Shortness of Breath, chest pain.  EXAM: CT ANGIOGRAPHY CHEST WITH CONTRAST  TECHNIQUE: Multidetector CT imaging of the chest was performed using the standard protocol during bolus administration of intravenous contrast. Multiplanar CT image  reconstructions including MIPs were obtained to evaluate the vascular anatomy.  CONTRAST:  OMNIPAQUE IOHEXOL 350 MG/ML SOLN  COMPARISON:  05/24/2011  FINDINGS: Satisfactory opacification of pulmonary arteries noted, and there is no evidence of pulmonary emboli. Adequate contrast opacification of the thoracic aorta with no evidence of dissection, aneurysm, or stenosis. There is classic 3-vessel brachiocephalic arch anatomy without proximal stenosis. Subcentimeter prevascular and precarinal lymph nodes. No hilar adenopathy. No pleural or pericardial effusion. Dependent atelectasis posteriorly in both lower lobes. Lungs otherwise clear. Visualized upper abdomen unremarkable. Thoracic spine and sternum unremarkable.  Review of the MIP images confirms the above findings.  IMPRESSION: Negative for acute PE or thoracic aortic dissection.   Electronically Signed   By: Oley Balm M.D.   On: 06/23/2013 04:39   US Ob Detail + 14 Wk  05/29/2013    DETAILED  SECOND TRIMESTER SONOGRAM  Miho Monda is in the office for detailed second trimester sonogram.  She is a 22 y.o. year old [id number] with Estimated Date of Delivery: 10/11/13  by early ultrasound now at  [redacted]w[redacted]d weeks gestation. Thus far the pregnancy  has been complicated by IDDM prev h/o ectopic and PTD.due to pre eclampsia  induction   GESTATION: SINGLETON  PRESENTATION: cephalic  FETAL ACTIVITY:          Heart rate         163 bpm          The fetus is active.  AMNIOTIC FLUID: The amniotic fluid volume is  normal,   PLACENTA LOCALIZATION:  anterior GRADE 0  CERVIX: Measures 4.1 cm  ADNEXA: The ovaries are normal.   GESTATIONAL AGE AND  BIOMETRICS:  Gestational criteria: Estimated Date of Delivery: 10/11/13 by early  ultrasound now at [redacted]w[redacted]d  Previous Scans:              BIPARIETAL DIAMETER           5.01 cm         21+1 weeks  HEAD CIRCUMFERENCE           19.15 cm         21+3 weeks  ABDOMINAL CIRCUMFERENCE           15.63 cm         20+6 weeks  FEMUR LENGTH            3.37 cm         20+4 weeks                                                           AVERAGE EGA(BY THIS SCAN):   21+0 weeks                                                 ESTIMATED FETAL WEIGHT:        373  grams,     ANATOMICAL SURVEY                                                                             COMMENTS CEREBRAL VENTRICLES yes normal   CHOROID PLEXUS yes normal   CEREBELLUM yes normal   CISTERNA MAGNA yes normal   NUCHAL REGION yes normal   ORBITS yes normal   NASAL BONE yes normal   NOSE/LIP yes normal   FACIAL PROFILE yes normal   4 CHAMBERED HEART yes normal   OUTFLOW TRACTS yes normal   DIAPHRAGM yes normal   STOMACH yes normal   RENAL REGION yes normal   BLADDER yes normal   CORD INSERTION yes normal   3 VESSEL CORD yes normal   SPINE yes normal   ARMS/HANDS yes normal   LEGS/FEET yes normal   GENITALIA yes normal female  SUSPECTED ABNORMALITIES:  no  QUALITY OF SCAN: satisfactory   TECHNICIAN COMMENTS: U/S(20+5wks)-active fetus, meas c/w dates, fluid wnl, no major abnl noted,  cx long and closed, bilateral adnexa wnl, female fetus      A copy of this report including all images has been saved and backed up to  a second source for retrieval if needed. All measures and details of the  anatomical scan, placentation, fluid volume and pelvic anatomy are  contained in that report.  Chari Manning 05/29/2013 10:59 AM  Clinical Impression and recommendations:  I have reviewed the sonogram results above, combined with the patient's  current clinical course, below are my impressions and any appropriate  recommendations for management based on the sonographic findings.  1.  [id number] Estimated Date of Delivery: 10/11/13 by  early ultrasound and  confirmed by today's sonographic dating 2.  Normal fetal sonographic findings, specifically normal detailed  anatomical survey 3.  Normal general sonographic findings  Recommend fetal echo at 24+/- weeks, growht scan at 28 weeks, the starting  at 32 weeks  alternating sonogram, NST for antepartum testing and periodic  growth evaluations  EURE,LUTHER H 05/29/2013 11:36 AM                Assessment and Plan: Patient Active Problem List   Diagnosis Date Noted  . Diabetes mellitus, antepartum 06/22/2013  . DM (diabetes mellitus), type 1, uncontrolled 01/26/2012  . Bipolar II disorder 01/20/2012    Class: Acute  . Tobacco abuse 12/24/2011   1) fever, sore throat - suspect viral illness. Rapid strep negative - will monitor today and treat sore throat with losenges  2) chest pain - reproducible on exam consistent either with msk strain or possible early acute rib fx - cxr and ct pe study neg - will watch for splinting- some bilateral atelectasis - if fevers don't improve, would consider d/c on po abx due to possibility of development of bacterial infection.   3) DM type I  - on pump - sugars here doing well - will cont ACHS BS  4) FWB - good heart tones for 24 weeker   5) possible d/c later today if remains stable.   Rulon Abide, MD OB fellow Faculty Practice, [facility]

## 2013-06-23 NOTE — ED Notes (Signed)
To ct

## 2013-06-23 NOTE — ED Notes (Signed)
[facility] at bedside 

## 2013-06-23 NOTE — ED Notes (Signed)
The pt has had a temp and a sore throat since last pm.  She was seen at her ob this am and her temp was 99. Plus.  No tylenol,  C/o being cold.  Skin hot and clammy.  cbg low.  Orange juice and sandwich given

## 2013-06-23 NOTE — ED Notes (Signed)
The pt is ready for a c-t of her chest.  c-t notified

## 2013-06-23 NOTE — Discharge Summary (Signed)
Physician Discharge Summary  Patient ID: [name] MRN: [id number] DOB/AGE: 10-29-1990 22 y.o.  Admit date: 06/22/2013 Discharge date: 06/23/2013  Admission Diagnoses:repiratory infection , pregnancy, Class C diabetes  Discharge Diagnoses: same Active Problems:   * No active hospital problems. *   Discharged Condition: fair  Hospital Course: no fever, negative radiologic studies  Consults: None  Significant Diagnostic Studies: radiology: CXR: normal and CT scan: normal  Treatments: IV hydration  Discharge Exam: Blood pressure 110/65, pulse 99, temperature 97.8 F (36.6 C), temperature source Oral, resp. rate 18, height 5' 5.5" (1.664 m), weight 145 lb (65.772 kg), last menstrual period 01/09/2013, SpO2 99.00%, not currently breastfeeding. General appearance: alert and cooperative Chest wall: no tenderness, chest clear  Disposition: 01-Home or Self Care   Future Appointments Provider Department Dept Phone   07/06/2013 10:15 AM Lazaro Arms, MD [facility] [phone]       Medication List         acetaminophen 500 MG tablet  Commonly known as:  TYLENOL  Take 500 mg by mouth every 6 (six) hours as needed for pain.     butalbital-acetaminophen-caffeine 50-325-40 MG per tablet  Commonly known as:  FIORICET  Take 1-2 tablets by mouth every 6 (six) hours as needed for headache.     glucagon 1 MG injection  Commonly known as:  GLUCAGON EMERGENCY  Inject 1 mg into the vein once as needed. For severe hypoglycemia.     insulin aspart 100 UNIT/ML injection  Commonly known as:  novoLOG  1 Units by Other route continuous. 1 unit for every 7 grams of carbs.           Follow-up Information   Follow up with [facility] On 07/06/2013.   Contact information:   [address] [place] [address] [phone]      Signed: Scheryl Darter 06/23/2013, 2:40 PM

## 2013-06-23 NOTE — ED Notes (Signed)
The pt returned from c-t iv rt a-c removed dur to pain and the pts request.

## 2013-06-23 NOTE — ED Notes (Signed)
ujable to ambulate the pt the pt is on a fetal monitor and the ob rapid response is at the bedside

## 2013-06-23 NOTE — ED Notes (Signed)
CBG checked 168

## 2013-06-24 LAB — URINE CULTURE: Colony Count: 40000

## 2013-06-24 LAB — CULTURE, GROUP A STREP

## 2013-06-26 MED ORDER — IOHEXOL 350 MG/ML SOLN
100.0000 mL | Freq: Once | INTRAVENOUS | Status: AC | PRN
  Administered 2013-06-26: 100 mL via INTRAVENOUS

## 2013-06-26 NOTE — H&P (Signed)
Attestation of Attending Supervision of Advanced Practitioner: Evaluation and management procedures were performed by the PA/NP/CNM/OB Fellow under my supervision/collaboration. Chart reviewed and agree with management and plan.  Wynema Garoutte V 06/26/2013 7:53 AM

## 2013-07-06 ENCOUNTER — Encounter: Payer: Self-pay | Admitting: Obstetrics & Gynecology

## 2013-07-06 ENCOUNTER — Ambulatory Visit (INDEPENDENT_AMBULATORY_CARE_PROVIDER_SITE_OTHER): Payer: PRIVATE HEALTH INSURANCE | Admitting: Obstetrics & Gynecology

## 2013-07-06 VITALS — BP 90/60 | Wt 144.5 lb

## 2013-07-06 DIAGNOSIS — O09219 Supervision of pregnancy with history of pre-term labor, unspecified trimester: Secondary | ICD-10-CM

## 2013-07-06 DIAGNOSIS — Z1389 Encounter for screening for other disorder: Secondary | ICD-10-CM

## 2013-07-06 DIAGNOSIS — O99340 Other mental disorders complicating pregnancy, unspecified trimester: Secondary | ICD-10-CM

## 2013-07-06 DIAGNOSIS — O24919 Unspecified diabetes mellitus in pregnancy, unspecified trimester: Secondary | ICD-10-CM

## 2013-07-06 DIAGNOSIS — Z331 Pregnant state, incidental: Secondary | ICD-10-CM

## 2013-07-06 DIAGNOSIS — O09299 Supervision of pregnancy with other poor reproductive or obstetric history, unspecified trimester: Secondary | ICD-10-CM

## 2013-07-06 LAB — POCT URINALYSIS DIPSTICK
Leukocytes, UA: NEGATIVE
Nitrite, UA: NEGATIVE
Protein, UA: NEGATIVE

## 2013-07-06 NOTE — Progress Notes (Signed)
Blood sugars are better making 1 small change: Carbs 1:4, 12a-6a 1.1, 6a-2p 2.0, 2p-12a 2.0 BP weight and urine results all reviewed and noted. Patient reports good fetal movement, denies any bleeding and no rupture of membranes symptoms or regular contractions. Patient is without complaints. All questions were answered.

## 2013-07-08 IMAGING — US US OB TRANSVAGINAL
1 series · 13 of 28 positions shown · non-contrast
Comparison: none

[Series 1: us ob transvaginal · 13 of 56 slices shown]
[im 3/56]
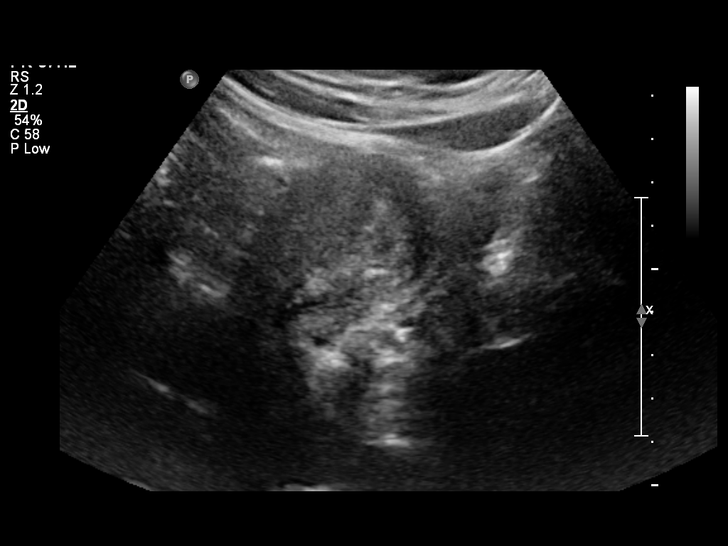
[im 7/56]
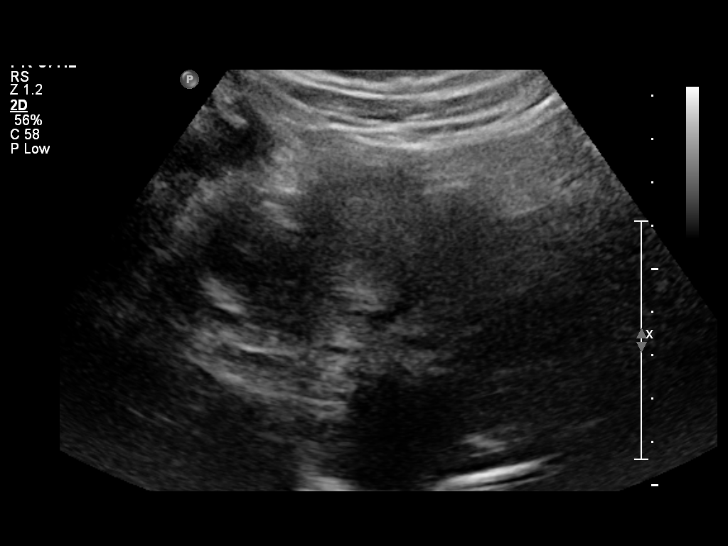
[im 11/56]
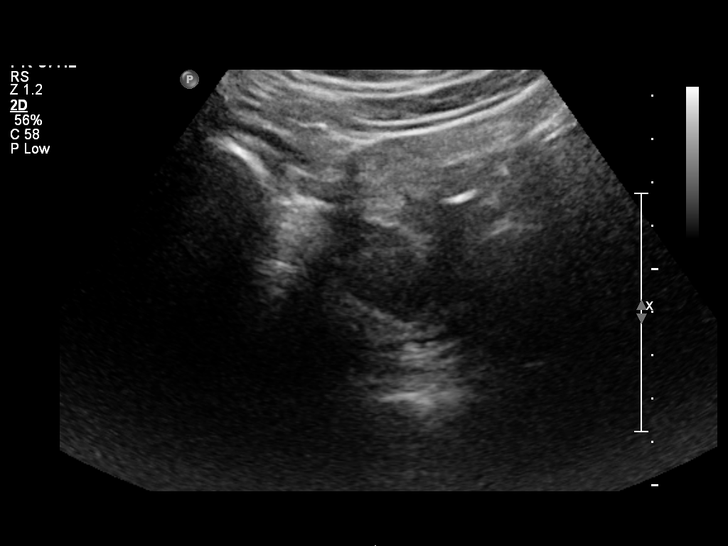
[im 15/56]
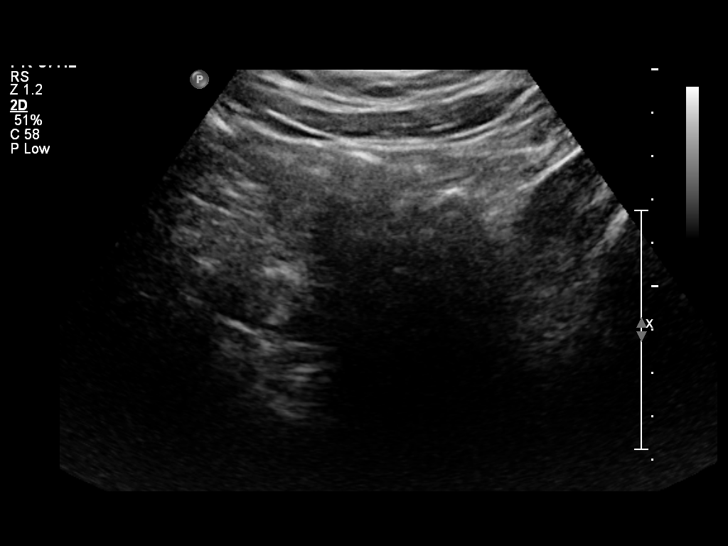
[im 19/56]
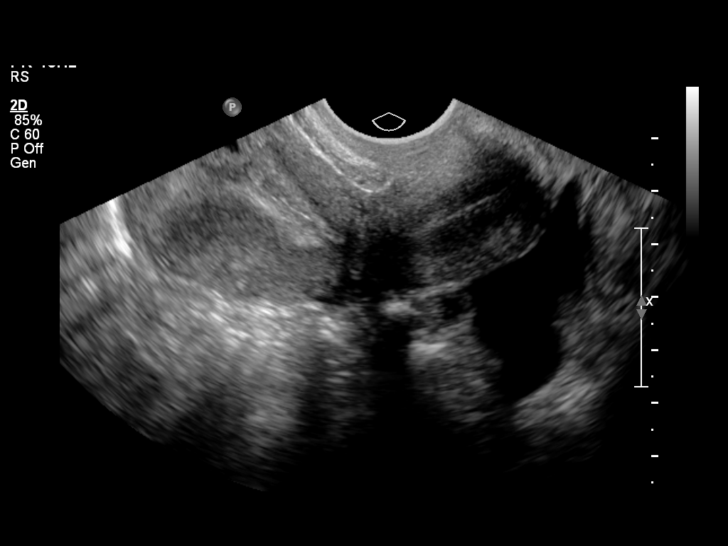
[im 23/56]
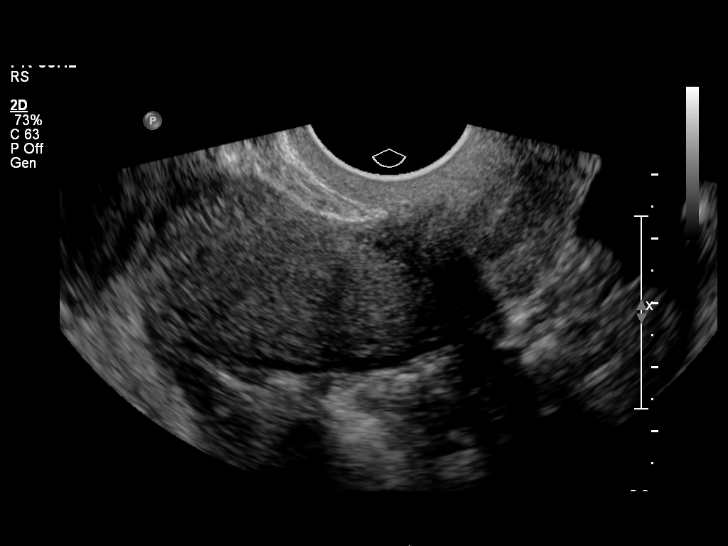
[im 29/56]
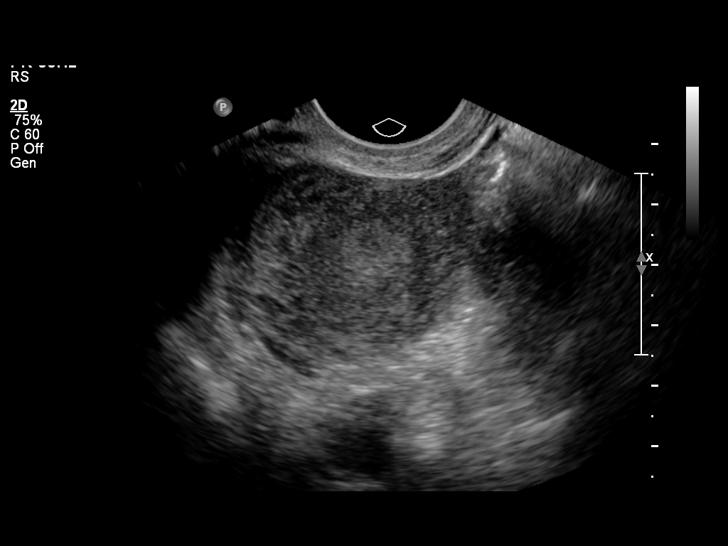
[im 33/56]
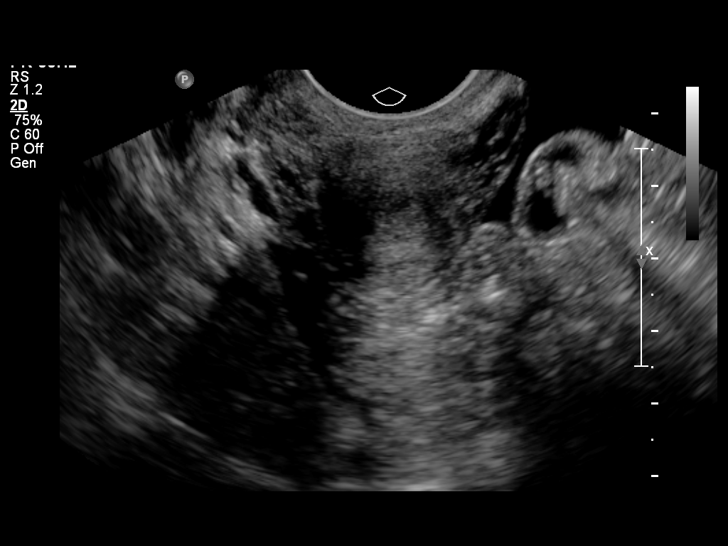
[im 37/56]
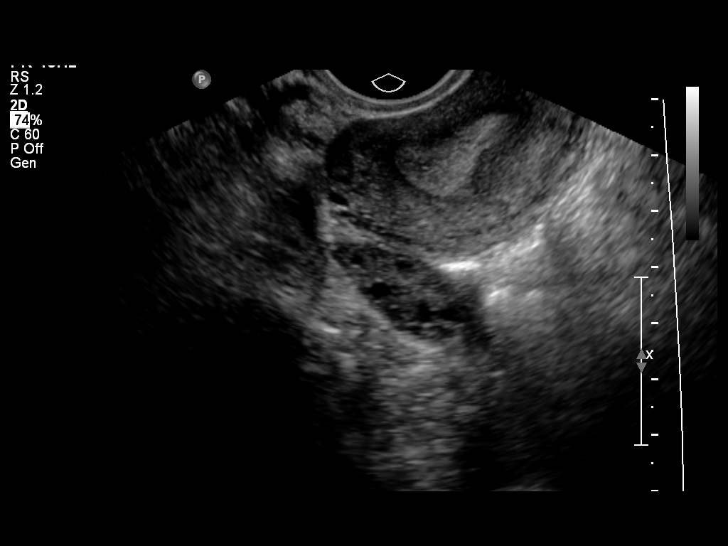
[im 41/56]
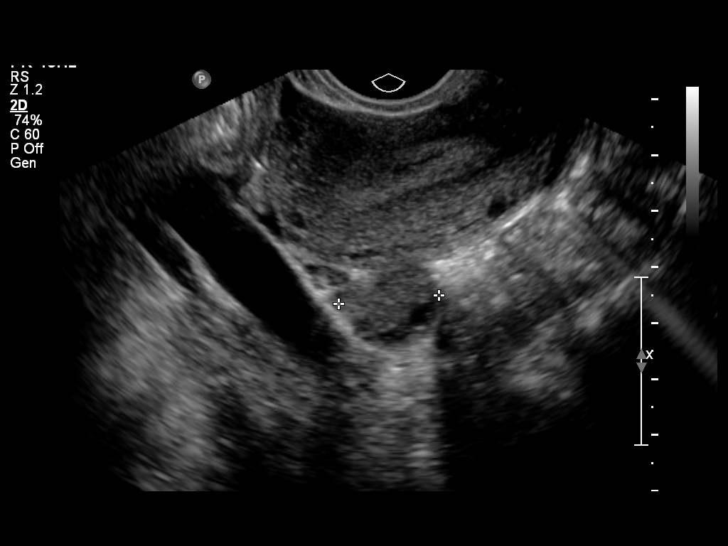
[im 45/56]
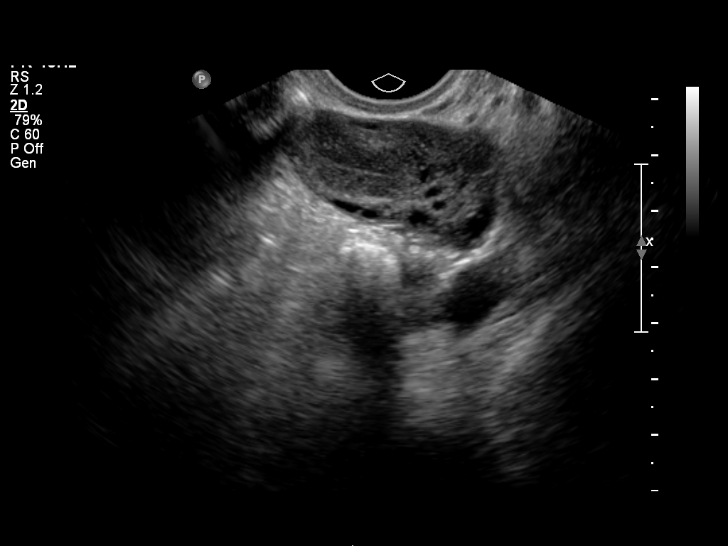
[im 49/56]
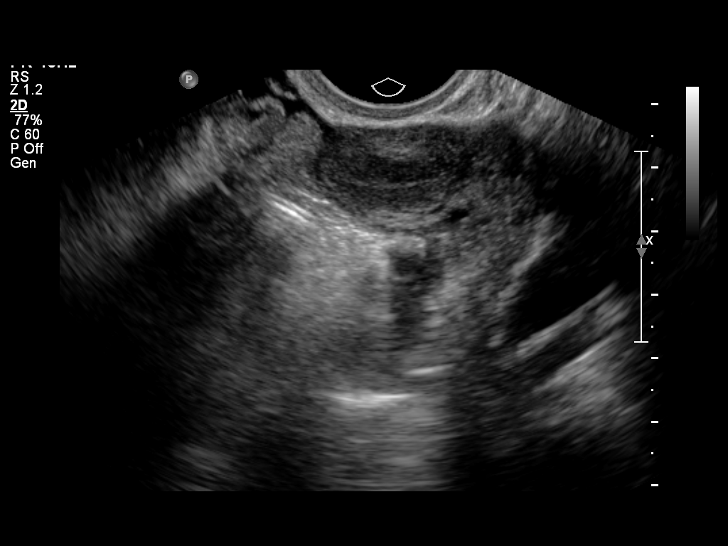
[im 53/56]
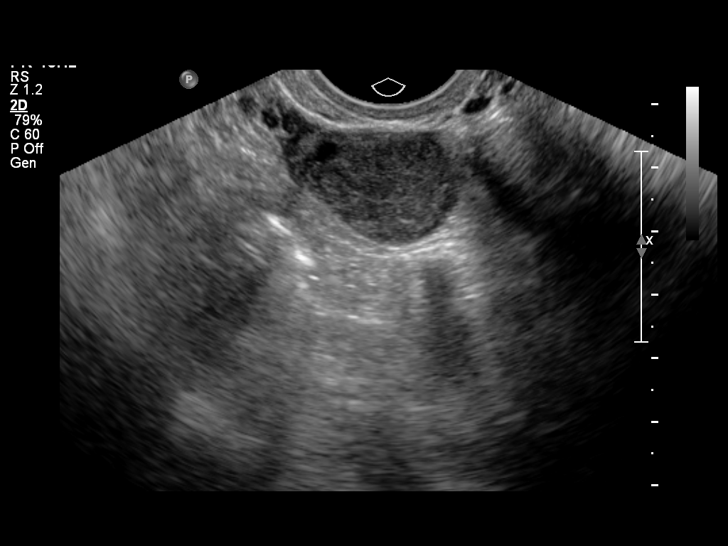

[13 of 28 positions shown; findings below may reference images not displayed]

OBSTETRICS REPORT
                      (Signed Final 01/21/2012 [DATE])

                 13_E
Procedures

 US OB TRANSVAGINAL                                    76817.0
 US OB COMP LESS 14 WKS                                76801.0
Indications

 Unsure of LMP;  Establish Gestational [AGE]
 Pregnancy with inconclusive fetal viability
 Previous ectopic pregnancy [id number]  (Right side
 treated with MTX)
 Cigarette smoker
 Bipolor disorder
 Diabetes - Type I
Fetal Evaluation

 Preg. Location:    Intrauterine
 Gest. Sac:         Intrauterine
 Yolk Sac:          Visualized
 Fetal Pole:        Not visualized
 Cardiac Activity:  No embryo visualized
Biometry

 GS:       6.3  mm    G. Age:   5w 1d                  EDD:   09/21/12
Cervix Uterus Adnexa

 Cervix:       Closed.
 Uterus:       Normal shape and size.
 Cul De Sac:   Moderate amount of free fluid seen.
 Left Ovary:   Within normal limits. Small corpus luteum noted.
 Right Ovary:  Within normal limits.

 Adnexa:     No abnormality visualized.
Impression

 Intrauterine gestational sac with yolk sac.  No embryo seen at
 this time.  Follow up ultrasound in 10-14 days is
 recommended to document appropriate interval growth,
 presence of fetal pole, and for dating purposes.

## 2013-07-13 IMAGING — US US OB TRANSVAGINAL
1 series · 13 of 28 positions shown · non-contrast
Comparison: none

[Series 1: us ob transvaginal · 13 of 28 slices shown]
[im 2/28]
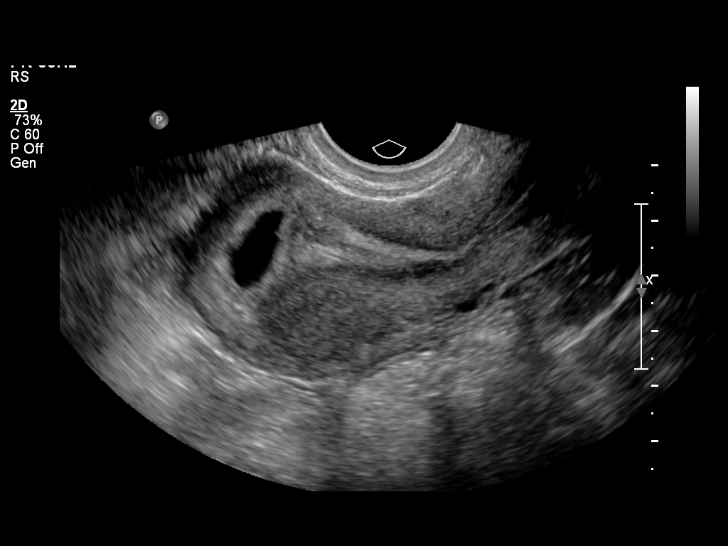
[im 4/28]
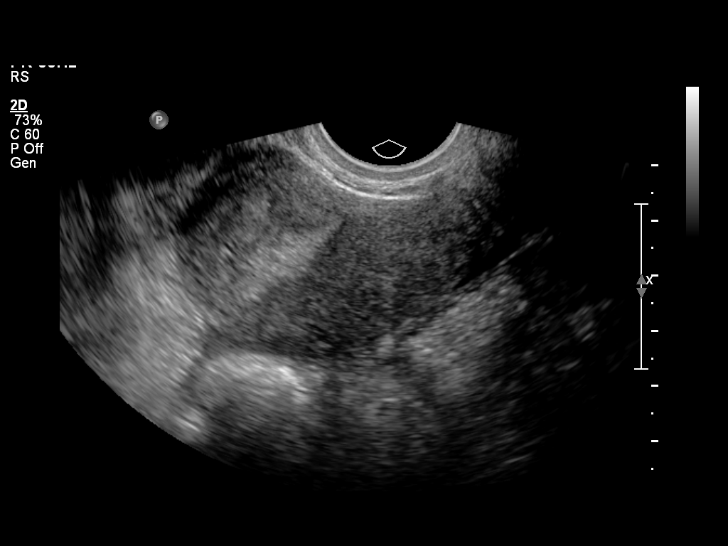
[im 6/28]
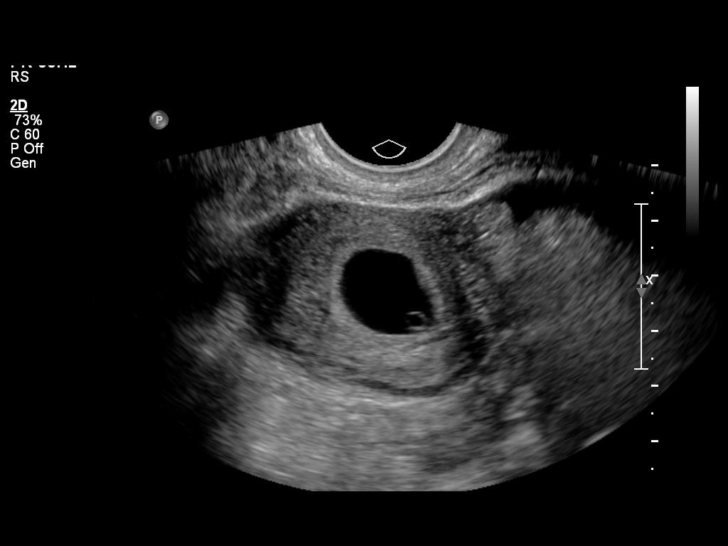
[im 8/28]
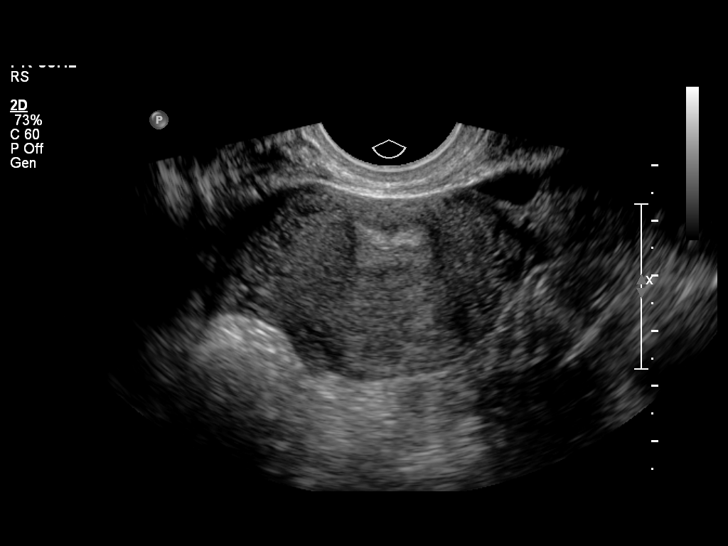
[im 10/28]
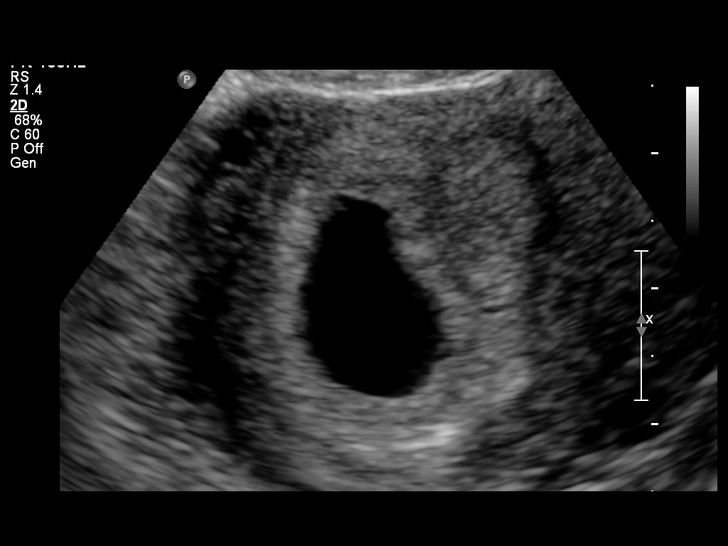
[im 12/28]
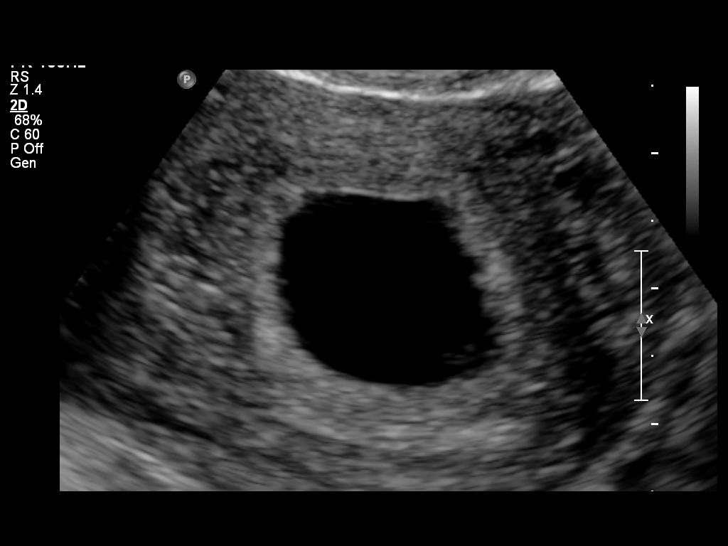
[im 15/28]
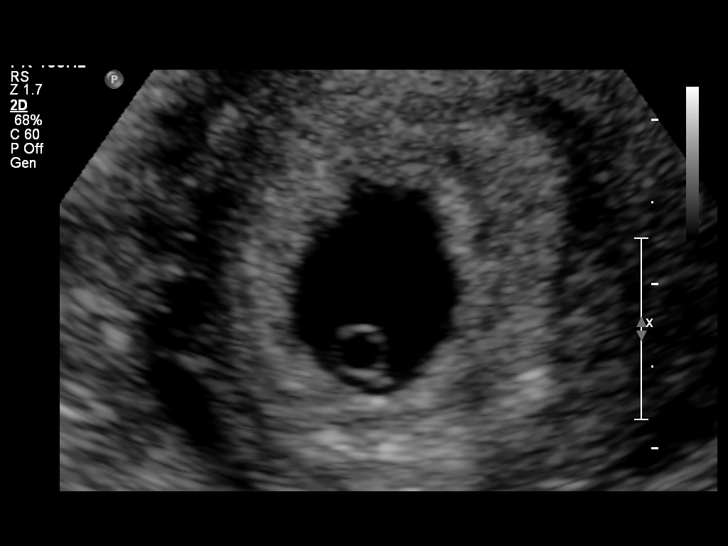
[im 17/28]
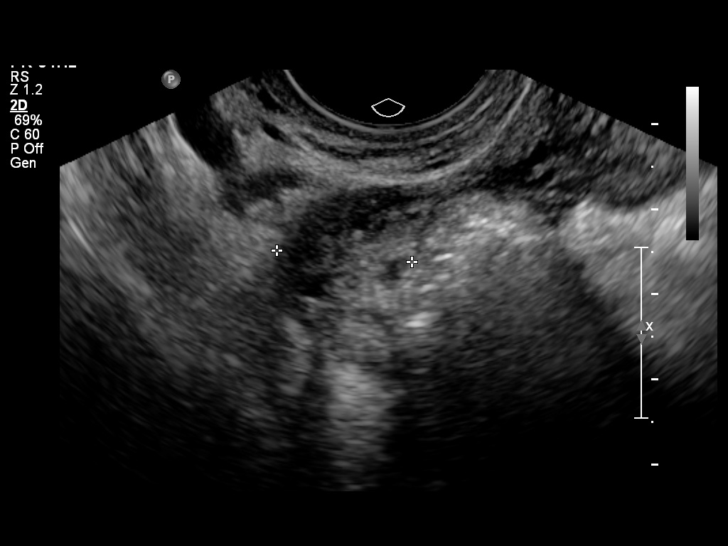
[im 19/28]
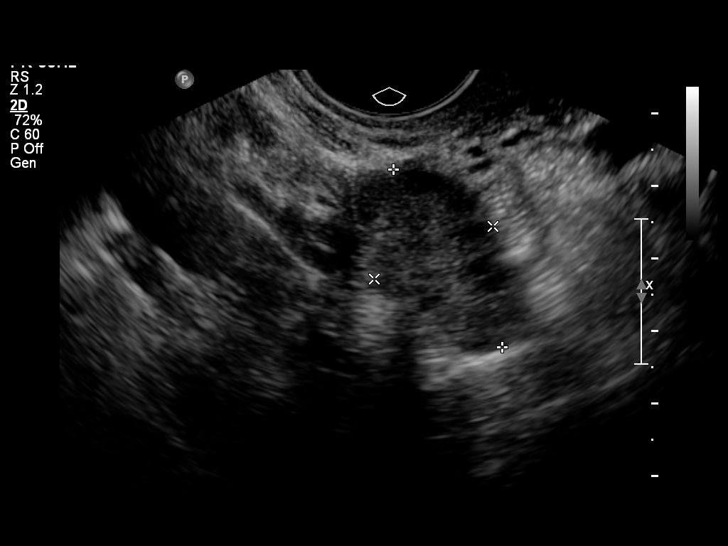
[im 21/28]
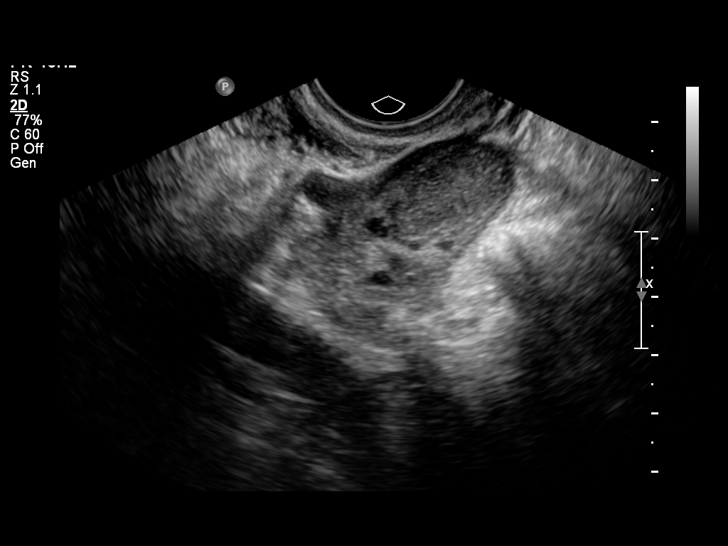
[im 23/28]
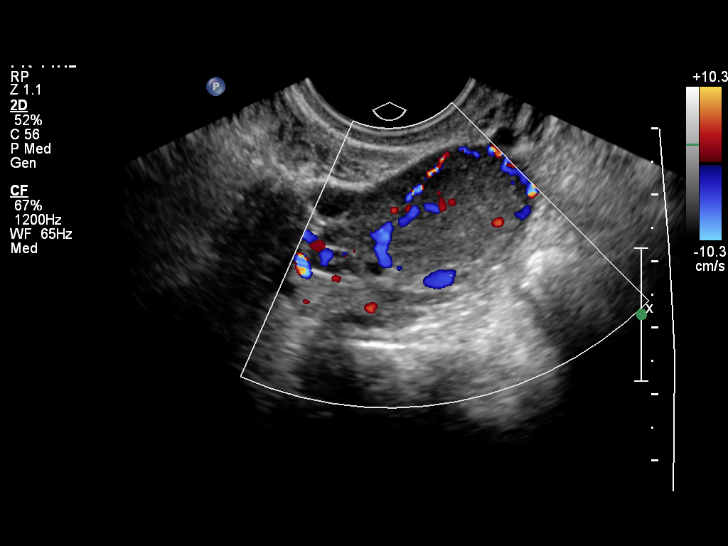
[im 25/28]
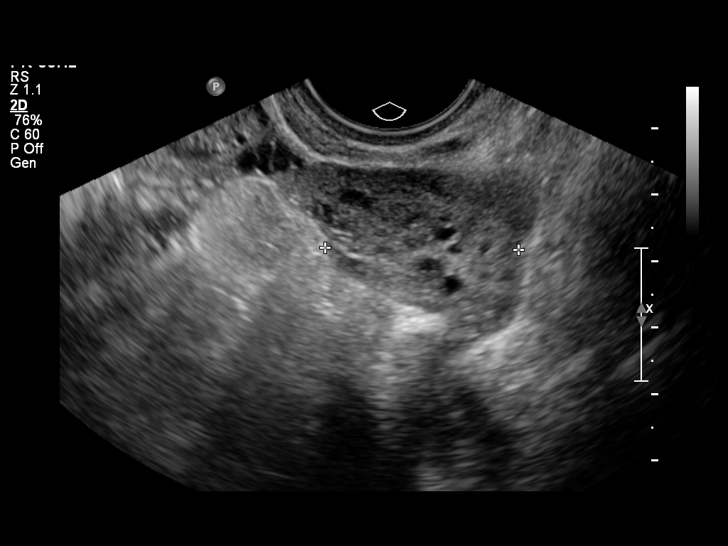
[im 27/28]
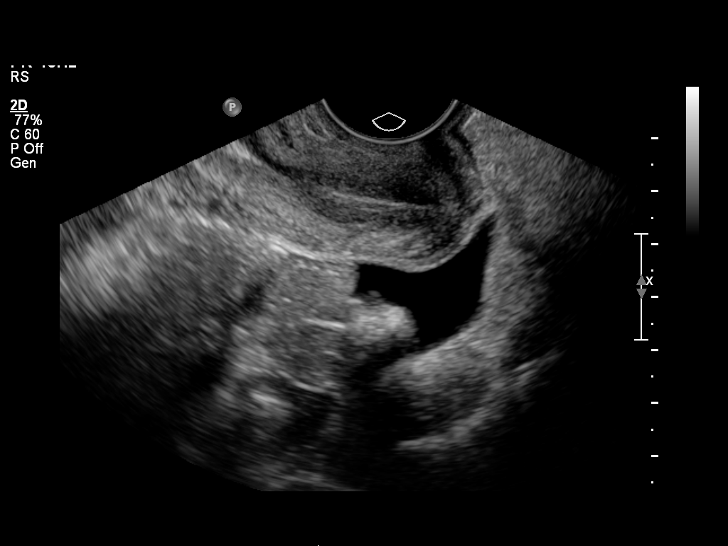

[13 of 28 positions shown; findings below may reference images not displayed]

OBSTETRICS REPORT
                      (Signed Final 01/26/2012 [DATE])

 Order#:         [id number]
Procedures

 US OB TRANSVAGINAL                                    76817.0
Indications

 Unsure of LMP;  Establish Gestational [AGE]
 Previous ectopic pregnancy Oan-NBIB  (Right side
 treated with MTX)
 Diabetes - Pregestational
 Cigarette smoker
 Bipolor disorder
Fetal Evaluation

 Preg. Location:    Intrauterine
 Gest. Sac:         Intrauterine
 Yolk Sac:          Visualized
 Fetal Pole:        ? early embryo
 Cardiac Activity:  Not visualized
Biometry

 GS:      13.4  mm    G. Age:   6w 1d                  EDD:   09/19/12
 CRL:        1  mm    G. Age:   N/A                    EDD:
Cervix Uterus Adnexa

 Cervix:       Normal appearance by transvaginal scan
 Uterus:       No abnormality visualized.
 Cul De Sac:   Small amount of free fluid seen.
 Left Ovary:   Within normal limits. Small corpus luteum noted.
 Right Ovary:  Within normal limits.
 Adnexa:     No abnormality visualized.
Impression

 Single intrauterine gestational sac measuring 6w 1d  by MSD,
 with appropriate increase in sac size c/w prior exam.
 No significant maternal uterine or adnexal abnormality
 identified.
Recommendations

 Follow-up US no earlier than 7-10 days to confirm viability
 and optimal dating, unless indicated clinically for other signs
 or symptoms.

## 2013-07-20 ENCOUNTER — Other Ambulatory Visit: Payer: Self-pay | Admitting: Obstetrics and Gynecology

## 2013-07-20 ENCOUNTER — Ambulatory Visit (INDEPENDENT_AMBULATORY_CARE_PROVIDER_SITE_OTHER): Payer: PRIVATE HEALTH INSURANCE | Admitting: Obstetrics & Gynecology

## 2013-07-20 ENCOUNTER — Encounter: Payer: Self-pay | Admitting: Obstetrics & Gynecology

## 2013-07-20 ENCOUNTER — Telehealth: Payer: Self-pay | Admitting: *Deleted

## 2013-07-20 VITALS — BP 100/60 | Wt 150.0 lb

## 2013-07-20 DIAGNOSIS — O09219 Supervision of pregnancy with history of pre-term labor, unspecified trimester: Secondary | ICD-10-CM

## 2013-07-20 DIAGNOSIS — E109 Type 1 diabetes mellitus without complications: Secondary | ICD-10-CM

## 2013-07-20 DIAGNOSIS — O24919 Unspecified diabetes mellitus in pregnancy, unspecified trimester: Secondary | ICD-10-CM

## 2013-07-20 DIAGNOSIS — Z1389 Encounter for screening for other disorder: Secondary | ICD-10-CM

## 2013-07-20 DIAGNOSIS — O99019 Anemia complicating pregnancy, unspecified trimester: Secondary | ICD-10-CM

## 2013-07-20 DIAGNOSIS — Z331 Pregnant state, incidental: Secondary | ICD-10-CM

## 2013-07-20 DIAGNOSIS — O09299 Supervision of pregnancy with other poor reproductive or obstetric history, unspecified trimester: Secondary | ICD-10-CM

## 2013-07-20 DIAGNOSIS — O24913 Unspecified diabetes mellitus in pregnancy, third trimester: Secondary | ICD-10-CM

## 2013-07-20 DIAGNOSIS — Z3480 Encounter for supervision of other normal pregnancy, unspecified trimester: Secondary | ICD-10-CM

## 2013-07-20 DIAGNOSIS — O99340 Other mental disorders complicating pregnancy, unspecified trimester: Secondary | ICD-10-CM

## 2013-07-20 LAB — POCT URINALYSIS DIPSTICK
Blood, UA: NEGATIVE
Glucose, UA: 4
Leukocytes, UA: NEGATIVE
Nitrite, UA: NEGATIVE

## 2013-07-20 LAB — CBC
Hemoglobin: 11.6 g/dL — ABNORMAL LOW (ref 12.0–15.0)
MCH: 31.8 pg (ref 26.0–34.0)
MCHC: 34.6 g/dL (ref 30.0–36.0)
Platelets: 239 10*3/uL (ref 150–400)
RBC: 3.65 MIL/uL — ABNORMAL LOW (ref 3.87–5.11)

## 2013-07-20 LAB — RPR

## 2013-07-20 LAB — HIV ANTIBODY (ROUTINE TESTING W REFLEX): HIV: NONREACTIVE

## 2013-07-20 MED ORDER — INSULIN ASPART 100 UNIT/ML ~~LOC~~ SOLN: 1.0000 [IU] | SUBCUTANEOUS | Status: DC

## 2013-07-20 NOTE — Progress Notes (Signed)
C/C Pain lower part of stomach.

## 2013-07-20 NOTE — Progress Notes (Signed)
Blood sugars by pt report:  Good No changes to pump    Fetal ECHO reported to be good BP weight and urine results all reviewed and noted. Patient reports good fetal movement, denies any bleeding and no rupture of membranes symptoms or regular contractions. Patient is without complaints. All questions were answered.

## 2013-07-20 NOTE — Telephone Encounter (Signed)
Pt requesting refill on Novolog. Dr. Emelda Fear e-scribed Novolog 100 units, pt aware.

## 2013-07-20 NOTE — Addendum Note (Signed)
Addended by: Criss Alvine on: 07/20/2013 11:10 AM   Modules accepted: Orders

## 2013-07-23 LAB — HSV 2 ANTIBODY, IGG: HSV 2 Glycoprotein G Ab, IgG: <0.1 IV

## 2013-07-25 ENCOUNTER — Ambulatory Visit (INDEPENDENT_AMBULATORY_CARE_PROVIDER_SITE_OTHER): Payer: PRIVATE HEALTH INSURANCE

## 2013-07-25 DIAGNOSIS — Z23 Encounter for immunization: Secondary | ICD-10-CM

## 2013-08-01 ENCOUNTER — Inpatient Hospital Stay (HOSPITAL_COMMUNITY)
Admission: AD | Admit: 2013-08-01 | Discharge: 2013-08-03 | DRG: 781 | Disposition: A | Discharge status: Home / Self Care | Payer: PRIVATE HEALTH INSURANCE | Source: Ambulatory Visit | Attending: Obstetrics and Gynecology | Admitting: Obstetrics and Gynecology

## 2013-08-01 ENCOUNTER — Encounter (HOSPITAL_COMMUNITY): Payer: Self-pay | Admitting: *Deleted

## 2013-08-01 DIAGNOSIS — O09299 Supervision of pregnancy with other poor reproductive or obstetric history, unspecified trimester: Secondary | ICD-10-CM

## 2013-08-01 DIAGNOSIS — O99340 Other mental disorders complicating pregnancy, unspecified trimester: Secondary | ICD-10-CM | POA: Diagnosis present

## 2013-08-01 DIAGNOSIS — O24919 Unspecified diabetes mellitus in pregnancy, unspecified trimester: Secondary | ICD-10-CM | POA: Diagnosis present

## 2013-08-01 DIAGNOSIS — F3189 Other bipolar disorder: Secondary | ICD-10-CM | POA: Diagnosis present

## 2013-08-01 DIAGNOSIS — O403XX0 Polyhydramnios, third trimester, not applicable or unspecified: Secondary | ICD-10-CM | POA: Diagnosis present

## 2013-08-01 DIAGNOSIS — E71121 Propionic acidemia: Secondary | ICD-10-CM | POA: Diagnosis present

## 2013-08-01 DIAGNOSIS — O3660X0 Maternal care for excessive fetal growth, unspecified trimester, not applicable or unspecified: Secondary | ICD-10-CM | POA: Diagnosis present

## 2013-08-01 DIAGNOSIS — O4700 False labor before 37 completed weeks of gestation, unspecified trimester: Secondary | ICD-10-CM | POA: Diagnosis present

## 2013-08-01 DIAGNOSIS — O358XX0 Maternal care for other (suspected) fetal abnormality and damage, not applicable or unspecified: Secondary | ICD-10-CM | POA: Diagnosis present

## 2013-08-01 DIAGNOSIS — E860 Dehydration: Secondary | ICD-10-CM | POA: Diagnosis present

## 2013-08-01 DIAGNOSIS — O409XX0 Polyhydramnios, unspecified trimester, not applicable or unspecified: Principal | ICD-10-CM | POA: Diagnosis present

## 2013-08-01 DIAGNOSIS — Z794 Long term (current) use of insulin: Secondary | ICD-10-CM

## 2013-08-01 DIAGNOSIS — O4703 False labor before 37 completed weeks of gestation, third trimester: Secondary | ICD-10-CM

## 2013-08-01 DIAGNOSIS — O24913 Unspecified diabetes mellitus in pregnancy, third trimester: Secondary | ICD-10-CM

## 2013-08-01 DIAGNOSIS — IMO0002 Reserved for concepts with insufficient information to code with codable children: Secondary | ICD-10-CM | POA: Diagnosis present

## 2013-08-01 DIAGNOSIS — O99330 Smoking (tobacco) complicating pregnancy, unspecified trimester: Secondary | ICD-10-CM | POA: Diagnosis present

## 2013-08-01 DIAGNOSIS — E1065 Type 1 diabetes mellitus with hyperglycemia: Secondary | ICD-10-CM | POA: Diagnosis present

## 2013-08-01 LAB — GLUCOSE, CAPILLARY: Glucose-Capillary: 235 mg/dL — ABNORMAL HIGH (ref 70–99)

## 2013-08-01 NOTE — MAU Note (Signed)
Pt states she has had nausea and vomiting today . Pt states she threw up 3 or 4 different times

## 2013-08-01 NOTE — MAU Note (Signed)
Pt G3 P1 at 29.6wks with sharp abd pain that comes and goes and vomiting.

## 2013-08-02 ENCOUNTER — Observation Stay (HOSPITAL_COMMUNITY): Payer: PRIVATE HEALTH INSURANCE

## 2013-08-02 ENCOUNTER — Encounter (HOSPITAL_COMMUNITY): Payer: Self-pay | Admitting: *Deleted

## 2013-08-02 DIAGNOSIS — E108 Type 1 diabetes mellitus with unspecified complications: Secondary | ICD-10-CM

## 2013-08-02 DIAGNOSIS — E1065 Type 1 diabetes mellitus with hyperglycemia: Secondary | ICD-10-CM

## 2013-08-02 DIAGNOSIS — O4700 False labor before 37 completed weeks of gestation, unspecified trimester: Secondary | ICD-10-CM

## 2013-08-02 DIAGNOSIS — O409XX0 Polyhydramnios, unspecified trimester, not applicable or unspecified: Secondary | ICD-10-CM

## 2013-08-02 DIAGNOSIS — O24919 Unspecified diabetes mellitus in pregnancy, unspecified trimester: Secondary | ICD-10-CM

## 2013-08-02 LAB — URINE MICROSCOPIC-ADD ON: WBC, UA: NONE SEEN WBC/hpf (ref <3)

## 2013-08-02 LAB — WET PREP, GENITAL
Clue Cells Wet Prep HPF POC: NONE SEEN
Trich, Wet Prep: NONE SEEN
Yeast Wet Prep HPF POC: NONE SEEN

## 2013-08-02 LAB — GLUCOSE, CAPILLARY
Glucose-Capillary: 306 mg/dL — ABNORMAL HIGH (ref 70–99)
Glucose-Capillary: 288 mg/dL — ABNORMAL HIGH (ref 70–99)
Glucose-Capillary: 153 mg/dL — ABNORMAL HIGH (ref 70–99)
Glucose-Capillary: 156 mg/dL — ABNORMAL HIGH (ref 70–99)

## 2013-08-02 LAB — URINALYSIS, ROUTINE W REFLEX MICROSCOPIC
Bilirubin Urine: NEGATIVE
Glucose, UA: >1000 mg/dL — AB
Ketones, ur: 40 mg/dL — AB
Leukocytes, UA: NEGATIVE
Protein, ur: NEGATIVE mg/dL
pH: 6 (ref 5.0–8.0)

## 2013-08-02 LAB — CBC
Hemoglobin: 11.5 g/dL — ABNORMAL LOW (ref 12.0–15.0)
MCH: 30.3 pg (ref 26.0–34.0)
Platelets: 294 10*3/uL (ref 150–400)
RBC: 3.8 MIL/uL — ABNORMAL LOW (ref 3.87–5.11)
WBC: 11 10*3/uL — ABNORMAL HIGH (ref 4.0–10.5)

## 2013-08-02 LAB — FETAL FIBRONECTIN: Fetal Fibronectin: NEGATIVE

## 2013-08-02 LAB — BASIC METABOLIC PANEL
CO2: 19 mEq/L (ref 19–32)
Calcium: 9.1 mg/dL (ref 8.4–10.5)
Chloride: 98 mEq/L (ref 96–112)
Glucose, Bld: 233 mg/dL — ABNORMAL HIGH (ref 70–99)
Potassium: 3.7 mEq/L (ref 3.5–5.1)
Sodium: 133 mEq/L — ABNORMAL LOW (ref 135–145)

## 2013-08-02 LAB — HEMOGLOBIN A1C: Hgb A1c MFr Bld: 9.5 % — ABNORMAL HIGH (ref <5.7)

## 2013-08-02 LAB — KETONES, QUALITATIVE

## 2013-08-02 MED ORDER — INSULIN ASPART 100 UNIT/ML ~~LOC~~ SOLN
8.0000 [IU] | Freq: Once | SUBCUTANEOUS | Status: AC
  Administered 2013-08-02: 8 [IU] via SUBCUTANEOUS

## 2013-08-02 MED ORDER — LACTATED RINGERS IV SOLN
INTRAVENOUS | Status: DC
  Administered 2013-08-02 – 2013-08-03 (×5): via INTRAVENOUS

## 2013-08-02 MED ORDER — NIFEDIPINE 10 MG PO CAPS
10.0000 mg | ORAL_CAPSULE | Freq: Four times a day (QID) | ORAL | Status: DC
  Administered 2013-08-02 – 2013-08-03 (×5): 10 mg via ORAL
  Filled 2013-08-02 (×5): qty 1

## 2013-08-02 MED ORDER — CALCIUM CARBONATE ANTACID 500 MG PO CHEW: 2.0000 | CHEWABLE_TABLET | ORAL | Status: DC | PRN

## 2013-08-02 MED ORDER — INSULIN ASPART 100 UNIT/ML ~~LOC~~ SOLN
5.0000 [IU] | Freq: Once | SUBCUTANEOUS | Status: AC
  Administered 2013-08-02: 5 [IU] via SUBCUTANEOUS

## 2013-08-02 MED ORDER — INSULIN ASPART 100 UNIT/ML ~~LOC~~ SOLN: 0.0000 [IU] | Freq: Three times a day (TID) | SUBCUTANEOUS | Status: DC

## 2013-08-02 MED ORDER — INSULIN ASPART 100 UNIT/ML ~~LOC~~ SOLN
10.0000 [IU] | Freq: Once | SUBCUTANEOUS | Status: DC
  Filled 2013-08-02: qty 0.1

## 2013-08-02 MED ORDER — ACETAMINOPHEN 325 MG PO TABS: 650.0000 mg | ORAL_TABLET | ORAL | Status: DC | PRN

## 2013-08-02 MED ORDER — NIFEDIPINE 10 MG PO CAPS
20.0000 mg | ORAL_CAPSULE | Freq: Once | ORAL | Status: AC
  Administered 2013-08-02: 20 mg via ORAL
  Filled 2013-08-02: qty 2

## 2013-08-02 MED ORDER — DOCUSATE SODIUM 100 MG PO CAPS
100.0000 mg | ORAL_CAPSULE | Freq: Every day | ORAL | Status: DC
  Filled 2013-08-02: qty 1

## 2013-08-02 MED ORDER — INSULIN NPH (HUMAN) (ISOPHANE) 100 UNIT/ML ~~LOC~~ SUSP
10.0000 [IU] | Freq: Two times a day (BID) | SUBCUTANEOUS | Status: DC
  Administered 2013-08-02: 10 [IU] via SUBCUTANEOUS
  Filled 2013-08-02: qty 10

## 2013-08-02 MED ORDER — INSULIN PUMP
Freq: Four times a day (QID) | SUBCUTANEOUS | Status: DC
  Administered 2013-08-02: 9.3 via SUBCUTANEOUS
  Administered 2013-08-02: 1.7 via SUBCUTANEOUS
  Filled 2013-08-02: qty 1

## 2013-08-02 MED ORDER — ZOLPIDEM TARTRATE 5 MG PO TABS: 5.0000 mg | ORAL_TABLET | Freq: Every evening | ORAL | Status: DC | PRN

## 2013-08-02 MED ORDER — PRENATAL MULTIVITAMIN CH
1.0000 | ORAL_TABLET | Freq: Every day | ORAL | Status: DC
  Filled 2013-08-02: qty 1

## 2013-08-02 MED ORDER — INSULIN ASPART 100 UNIT/ML ~~LOC~~ SOLN: 0.0000 [IU] | Freq: Four times a day (QID) | SUBCUTANEOUS | Status: DC

## 2013-08-02 NOTE — Progress Notes (Addendum)
ADA Standards of Care 2012: ADA/AACE Glucose Target Ranges:   Fasting: 60-90 mg/dL            2 hr post-prandial: <120 mg/dL  Have reviewed pt's pump basal settings and bolus settings. Target goal setting is set at 150-150 mg/dL. According to ADA/AACE standards of care, goal cbg should be set at 60-90 mg/dL for fasting and 161-096 mg/dL postprandial.  Setting target goal at 150 will not allow glucose to get below 150 which is higher than standard goal in pregnancy. Her basal rates are as follows: 2400-0600 @ 1.1 units/hr 0600-1400 @ 2 unts/hr 1400-2400 @ 2 units/hr Total daily basal rate is set for 43.8 units  Using the chart in the Diabetes Pregnant order set, her total basal should be around 20 units per day ([facility], 2 (2) to start. However, pt states Dr Despina Hidden has been adjusting and has had her total of 43.8.  This may be affective for control, however it would appear that the total basal would be covering more than basal needs. Her insulin to carbohydrate (CHO) rate is set at 1 unit per 5 gms; the calculations are 1 unit per 6 grams (which 5 grams would be appropriate as well.) The sensitivity factor is set at 50: 1 unit for every 50 mg/dL over target of 045 mg/dL. The calculated sensitivity factor (SF) is 1 unit per 20 mg/dL over target goal of 40-98 for fasting and 100-120 postprandial. It is my recommendation to remove the pump at this time and use the NPH at 15 in am and 15 at HS. The insulin to cho ratio at 1 unit per 5 and correction according to [facility] in correction scale.  Pt states she thinks her site is obstructed.  Someone is to bring her more pump supplies at some point today. I spoke with Dr. Macon Large who requested I put the subctaneous orders in (per above) and change her bolus settings on her pump according to above info as well as the target goal as well as the present 1 unit per 6 grams CHO.  Pt received bolus for her carb coverage this am.  She has not had any  correction dose since her fasting this am.  Will need to check cbg 2 hrs after the meal today.  Will use the subcutaneous until her supplies for pump arrive.  Will assist pt with pump adjustments as well.  All discussed with Dr. Macon Large and she requested I enter these orders. Will follow throughout hospital admission. Thank you, Lenor Coffin, RN, Diabetes CNS [phone])

## 2013-08-02 NOTE — Progress Notes (Signed)
FACULTY PRACTICE ANTEPARTUM(COMPREHENSIVE) NOTE  [name] is a 22 y.o. [id number] at [redacted]w[redacted]d by early ultrasound who is admitted for diabetic control, contractions.   Fetal presentation is cephalic. Length of Stay:  1  Days  Subjective:patient has had labile CBG's thru the morning hours, was 233  At MN, incr to 306(4am), confirmed by serum glucose as 356, given pump correction dose, then 288 at 5:45 am, then given 8 units SQ, will rechk in 1 hr. Now 176 Contractions ceased with single dose procardia 20 , now on 10 mg q 6h. Patient reports the fetal movement as active. Patient reports uterine contraction  activity as resolved Patient reports  vaginal bleeding as none. Patient describes fluid per vagina as None.  Vitals:  Blood pressure 125/72, pulse 105, temperature 98.2 F (36.8 C), temperature source Oral, resp. rate 18, height 5\' 5"  (1.651 m), weight 68.04 kg (150 lb), last menstrual period 01/09/2013. Physical Examination:  General appearance - alert, well appearing, and in no distress Heart - normal rate and regular rhythm Abdomen - soft, nontender, nondistended Fundal Height:  size equals dates Cervical Exam: Not evaluated.  FFN negative from admit and Cx was 1/20/-2 Extremities: extremities normal, atraumatic, no cyanosis or edema and Homans sign is negative, no sign of DVT with DTRs 2+ bilaterally Membranes:intact  Fetal Monitoring:  Baseline: 150 bpm  Labs:  Results for orders placed during the hospital encounter of 08/01/13 (from the past 24 hour(s))  URINALYSIS, ROUTINE W REFLEX MICROSCOPIC   Collection Time    08/01/13 11:25 PM      Result Value Range   Color, Urine YELLOW  YELLOW   APPearance CLEAR  CLEAR   Specific Gravity, Urine 1.025  1.005 - 1.030   pH 6.0  5.0 - 8.0   Glucose, UA >1000 (*) NEGATIVE mg/dL   Hgb urine dipstick TRACE (*) NEGATIVE   Bilirubin Urine NEGATIVE  NEGATIVE   Ketones, ur 40 (*) NEGATIVE mg/dL   Protein, ur NEGATIVE  NEGATIVE mg/dL   Urobilinogen, UA 0.2  0.0 - 1.0 mg/dL   Nitrite NEGATIVE  NEGATIVE   Leukocytes, UA NEGATIVE  NEGATIVE  URINE MICROSCOPIC-ADD ON   Collection Time    08/01/13 11:25 PM      Result Value Range   Squamous Epithelial / LPF RARE  RARE   WBC, UA    <3 WBC/hpf   Value: NO FORMED ELEMENTS SEEN ON URINE MICROSCOPIC EXAMINATION   RBC / HPF 0-2  <3 RBC/hpf   Bacteria, UA FEW (*) RARE  GLUCOSE, CAPILLARY   Collection Time    08/01/13 11:46 PM      Result Value Range   Glucose-Capillary 235 (*) 70 - 99 mg/dL  FETAL FIBRONECTIN   Collection Time    08/02/13  1:00 AM      Result Value Range   Fetal Fibronectin NEGATIVE  NEGATIVE  CBC   Collection Time    08/02/13  1:30 AM      Result Value Range   WBC 11.0 (*) 4.0 - 10.5 K/uL   RBC 3.80 (*) 3.87 - 5.11 MIL/uL   Hemoglobin 11.5 (*) 12.0 - 15.0 g/dL   HCT 45.4 (*) 09.8 - 11.9 %   MCV 86.6  78.0 - 100.0 fL   MCH 30.3  26.0 - 34.0 pg   MCHC 35.0  30.0 - 36.0 g/dL   RDW 14.7  82.9 - 56.2 %   Platelets 294  150 - 400 K/uL  BASIC METABOLIC PANEL  Collection Time    08/02/13  1:30 AM      Result Value Range   Sodium 133 (*) 135 - 145 mEq/L   Potassium 3.7  3.5 - 5.1 mEq/L   Chloride 98  96 - 112 mEq/L   CO2 19  19 - 32 mEq/L   Glucose, Bld 233 (*) 70 - 99 mg/dL   BUN 12  6 - 23 mg/dL   Creatinine, Ser 4.54 (*) 0.50 - 1.10 mg/dL   Calcium 9.1  8.4 - 09.8 mg/dL   GFR calc non Af Amer >90  >90 mL/min   GFR calc Af Amer >90  >90 mL/min  WET PREP, GENITAL   Collection Time    08/02/13  2:35 AM      Result Value Range   Yeast Wet Prep HPF POC NONE SEEN  NONE SEEN   Trich, Wet Prep NONE SEEN  NONE SEEN   Clue Cells Wet Prep HPF POC NONE SEEN  NONE SEEN   WBC, Wet Prep HPF POC FEW (*) NONE SEEN  GLUCOSE, CAPILLARY   Collection Time    08/02/13  5:02 AM      Result Value Range   Glucose-Capillary 306 (*) 70 - 99 mg/dL  KETONES, QUALITATIVE   Collection Time    08/02/13  5:20 AM      Result Value Range   Acetone, Bld SMALL (*)  NEGATIVE  GLUCOSE, RANDOM   Collection Time    08/02/13  5:20 AM      Result Value Range   Glucose, Bld 356 (*) 70 - 99 mg/dL  GLUCOSE, CAPILLARY   Collection Time    08/02/13  6:05 AM      Result Value Range   Glucose-Capillary 288 (*) 70 - 99 mg/dL    Imaging Ultrasound ordered MFM Medications:  Scheduled . docusate sodium  100 mg Oral Daily  . NIFEdipine  10 mg Oral Q6H  . prenatal multivitamin  1 tablet Oral Q1200   I have reviewed the patient's current medications.  ASSESSMENT: Patient Active Problem List   Diagnosis Date Noted  . Diabetes mellitus, antepartum 06/22/2013  . DM (diabetes mellitus), type 1, uncontrolled 01/26/2012  . Bipolar II disorder 01/20/2012    Class: Acute  . Tobacco abuse 12/24/2011    PLAN: 1. Continue diabetic correction using pump      Consult [name] Diab Coordinator this am ordered     Ob ultrasound for growth this am. 2. PTL- resolved with single dose procardia, continue 10 mg q 6h        [name] V 08/02/2013,7:47 AM

## 2013-08-02 NOTE — Progress Notes (Signed)
UR completed 

## 2013-08-02 NOTE — Progress Notes (Signed)
Dr Emelda Fear notified of patient's blood sugar of 306.  MD advised RN to go by the insulin pump protocol.  Patient gave self bolus from insulin pump and stat serum ketones lab was ordered and will be collected.  RN will recheck blood sugar in one hour.

## 2013-08-02 NOTE — Progress Notes (Signed)
ADA Standards of Care 2012: ADA/AACE Glucose Target Ranges:   Fasting: 60-90 mg/dL            2 hr post-prandial: <120 mg/dL Pt's insulin supplies just arrived and pt wishes to continue her pump.  Ordered new vial of insulin and changed insertion site as well as tubing and insertion needle/cath. Have adjusted insulin basal and bolus rates to the following:  Basal: 2400-0400 @ 1.1 units/hr 0400-0600 @ 1.8 units/hr 0600-1200 @ 1.35 units/hr 1200-2400 @ 1.2 units/hr Total daily basal calculates to 30.5 units  Bolus wizard reset to: 1 unit per 6 grams CHO SF: 30 Target goal reset at 70-120 (although fasting glucose should be 60-90, but patient has not had a cbg below 100 mg/dL that she can remember.  As expected, pt will need adjustments in the days/weeks to come. Awaiting insulin vial to observe pt restarting with new infusion set and insulin re proper procedure.Thank you, Lenor Coffin, RN, Diabetes CNS [phone])

## 2013-08-02 NOTE — Progress Notes (Signed)
Results for LILY, VELASQUEZ (MRN [id number]) as of 08/02/2013 07:43  Ref. Range 08/01/2013 23:46 08/02/2013 05:02 08/02/2013 06:05  Glucose-Capillary Latest Range: 70-99 mg/dL 564 (H) 332 (H) 951 (H)    Spoke with the patient over the phone.  Patient reports that she is on a [employer] with Novolog insulin.  In talking with the patient she reports that she changed her infusion set 2 days ago and that since she put in the new site her blood glucose has been running high between 300-500's mg/dl.  Asked if she changed her site out since her blood sugars were high and she said she did not change her site out because her pump did not give her any type of error messages.  I asked that she change out her site and put a new one in but she does not have any pump supplies with her at the hospital and she states that she does not have anyone to bring her a new site set.  As the hospital does not stock any insulin pump supplies, if she does not have any supplies she will need to remove the insulin pump and receive SQ insulin injections.  Of note, patient's blood glucose was 306 mg/dl at 8:84 and patient done a correction via insulin pump (per patient she entered a manual bolus of 8 units for blood sugar at that time) and blood glucose was 288 mg/dl at 1:66.  MD was called and an order was given to give Novolg 8 units at 6:41. According to the RN last blood glucose was 176 mg/dl at 0:63.  Her current settings on her insulin pump are:  Basal rates: 12 am - 6 am  1.1 units/hour 6am - 2 pm  2.0 units/hour 2pm - 12am 2.0 units/hour  Insulin sensitivity: 1 unit drops 50 mg/dl Carb ration: 1 units for 4 grams of carbs Target blood glucose is 150 mg/dl  Lenor Coffin, RN, CDE is on her way over to [facility] to make recommendations for SQ insulin if patient is not able to get insulin supplies brought to her here at the hospital.   Thanks, Orlando Penner, RN, MSN, CCRN Diabetes Coordinator Inpatient Diabetes  Program [phone] (Team Pager) [phone] ([facility] office) [phone] [facility] office)

## 2013-08-02 NOTE — MAU Provider Note (Addendum)
CC Nausea and vomiting x 24 hours, uterine contractions q 3 minutes, type I diabetes.   History     CSN: [id number]  Arrival date and time: 08/01/13 2310   None     Chief Complaint  Patient presents with  . Abdominal Pain  . Emesis   Abdominal Pain Associated symptoms include vomiting.  Emesis  Associated symptoms include abdominal pain.   this 22 year old female gravida 3 para 41 with EDD 10/11/2013, presents at 30 weeks 0 days with complaints of abdominal discomfort nausea and vomiting all day without any bleeding rupture membranes or gush of fluid. Pregnancy course been notable for type 1 diabetes mellitus, limited control with last hemoglobin A1c of 8.8 03/26/2013. A review of CBGs is limited, with urinalyses routinely showing 3+ to 4+ glucosuria Tonight the patient reports that she began to be feeling poorly this morning, began to have abdominal cramping without any bleeding and she presents to the emergency room shortly before midnight with external monitoring showing mild contractions every 3 minutes fetal heart rate tracing category 1 examination notable for vaginal yeast infection. Fetal fibronectin is collected. Cervical exam shows the cervix to be tight fingertip long -2 with undetermined fetal presenting part. .  Pertinent Gynecological History: Menses: Short interpregnancy interval currently her baby at home is 77 months old   Bleeding:  Contraception: Unknown plans DES exposure: denies Blood transfusions: none Sexually transmitted diseases: no past history Previous GYN Procedures:   Last mammogram:  Date:  Last pap: 2 young never collected Date:    Past Medical History  Diagnosis Date  . 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 Corneal abrasion   . Candidiasis   . Tubal pregnancy   . Diabetes mellitus     dx age 34  . Diabetes mellitus type I   . Urinary tract infection   . Pregnancy induced hypertension   . Bipolar disorder   .  Pregnancy     Past Surgical History  Procedure Laterality Date  . Wisdom tooth extraction  2012    Family History  Problem Relation Age of Onset  . Anesthesia problems Neg Hx   . Cancer Maternal Grandmother     breast  . Hypertension Maternal Grandmother   . Cancer Maternal Grandfather     throat and lung    History  Substance Use Topics  . Smoking status: Current Every Day Smoker -- 0.50 packs/day for 2 years    Types: Cigarettes  . Smokeless tobacco: Never Used     Comment: quit with + preg  . Alcohol Use: No    Allergies: No Known Allergies  Prescriptions prior to admission  Medication Sig Dispense Refill  . insulin aspart (NOVOLOG) 100 UNIT/ML injection Inject 1 Units into the skin continuous. 1 unit for every 7 grams of carbs.  1 vial  prn  . acetaminophen (TYLENOL) 500 MG tablet Take 500 mg by mouth every 6 (six) hours as needed for pain.      . butalbital-acetaminophen-caffeine (FIORICET) 50-325-40 MG per tablet Take 1-2 tablets by mouth every 6 (six) hours as needed for headache.  20 tablet  0  . glucagon (GLUCAGON EMERGENCY) 1 MG injection Inject 1 mg into the vein once as needed. For severe hypoglycemia.  2 each  prn    Review of Systems  Gastrointestinal: Positive for vomiting and abdominal pain.   Physical Exam   Blood pressure 128/83, pulse 123, temperature 97.7 F (36.5 C), temperature source  Oral, resp. rate 18, height 5\' 5"  (1.651 m), weight 68.13 kg (150 lb 3.2 oz), last menstrual period 01/09/2013.  Physical Exam  Constitutional: She is oriented to person, place, and time. She appears well-developed and well-nourished.  Mild to moderate ly uncomfortable  HENT:  Head: Normocephalic and atraumatic.  Eyes: Pupils are equal, round, and reactive to light.  Cardiovascular: Regular rhythm.   Respiratory: Effort normal.  GI: Soft. Bowel sounds are normal. She exhibits no distension. There is no tenderness. There is no rebound.  Genitourinary: Vagina  normal.  Speculum exam shows vaginal yeast, no suspicion of membrane rupture with fetal fibronectin collected as well as wet prep. GC and Chlamydia cultures were negative earlier in pregnancy she is in a stable relationship  Musculoskeletal: Normal range of motion.  Neurological: She is alert and oriented to person, place, and time. She has normal reflexes.  Skin: Skin is warm and dry.  Psychiatric: She has a normal mood and affect. Her behavior is normal. Thought content normal.    MAU Course  Procedures Urinalysis    Component Value Date/Time   COLORURINE YELLOW 08/01/2013 2325   APPEARANCEUR CLEAR 08/01/2013 2325   LABSPEC 1.025 08/01/2013 2325   PHURINE 6.0 08/01/2013 2325   GLUCOSEU >1000* 08/01/2013 2325   HGBUR TRACE* 08/01/2013 2325   BILIRUBINUR NEGATIVE 08/01/2013 2325   BILIRUBINUR neg 05/21/2013 1232   KETONESUR 40* 08/01/2013 2325   PROTEINUR NEGATIVE 08/01/2013 2325   UROBILINOGEN 0.2 08/01/2013 2325   NITRITE NEGATIVE 08/01/2013 2325   NITRITE neg 07/20/2013 1013   LEUKOCYTESUR NEGATIVE 08/01/2013 2325     MDM Review of records,   Assessment and Plan  Pregnancy 30 week,  Type DM Preterm labor Ketosis.  Admit, hydrate, procardia to stop contractions, check ffn,  Currently pt has discontinued her pump, will restart   Guerin Lashomb V 08/02/2013, 1:02 AM

## 2013-08-02 NOTE — MAU Note (Signed)
Pt complaining of abdominal pain and F. Cres Dishmon CNM informed.

## 2013-08-02 NOTE — Progress Notes (Signed)
Lenor Coffin to come evaluate pump. If it is occluded then we will call MD and switch to SubQ insulin

## 2013-08-03 ENCOUNTER — Encounter: Payer: PRIVATE HEALTH INSURANCE | Admitting: Obstetrics & Gynecology

## 2013-08-03 ENCOUNTER — Encounter: Payer: Self-pay | Admitting: Obstetrics & Gynecology

## 2013-08-03 DIAGNOSIS — O09299 Supervision of pregnancy with other poor reproductive or obstetric history, unspecified trimester: Secondary | ICD-10-CM

## 2013-08-03 DIAGNOSIS — O3660X0 Maternal care for excessive fetal growth, unspecified trimester, not applicable or unspecified: Secondary | ICD-10-CM | POA: Diagnosis present

## 2013-08-03 DIAGNOSIS — E860 Dehydration: Secondary | ICD-10-CM | POA: Diagnosis present

## 2013-08-03 DIAGNOSIS — IMO0002 Reserved for concepts with insufficient information to code with codable children: Secondary | ICD-10-CM | POA: Diagnosis present

## 2013-08-03 DIAGNOSIS — O403XX0 Polyhydramnios, third trimester, not applicable or unspecified: Secondary | ICD-10-CM | POA: Diagnosis present

## 2013-08-03 DIAGNOSIS — R7309 Other abnormal glucose: Secondary | ICD-10-CM

## 2013-08-03 DIAGNOSIS — O4700 False labor before 37 completed weeks of gestation, unspecified trimester: Secondary | ICD-10-CM | POA: Diagnosis present

## 2013-08-03 DIAGNOSIS — E71121 Propionic acidemia: Secondary | ICD-10-CM | POA: Diagnosis present

## 2013-08-03 LAB — GLUCOSE, CAPILLARY: Glucose-Capillary: 180 mg/dL — ABNORMAL HIGH (ref 70–99)

## 2013-08-03 MED ORDER — INSULIN PUMP: 1.0000 | SUBCUTANEOUS | Status: DC

## 2013-08-03 NOTE — Progress Notes (Signed)
Have reviewed glucose readings since the insulin pump was restarted yesterday mid-day.  Her glucose levels are still elevated, however pt states that they are better than they usually run.  She states they typically run in 200 range including her fastings.  I am not sure that this pt is actually checking her glucose and using her correction or covering her carbs.  There was mention in the [facility] that pt did not cover some of her food due to her 'not eating enough to cover'.  Pt states she has an MD appt this afternoon and he can adjust her rates and bolus doses.  It would make sense that her basal total was set to be higher than calculated possibly due to the pt not covering her carbs nor correcting her cbg's.  I will not recommend further changes to her pump setup since she will be seen by MD today. Thank you, Lenor Coffin, RN, Diabetes CNS [phone])

## 2013-08-03 NOTE — Discharge Summary (Signed)
Antenatal Physician Discharge Summary  Patient ID: [name] MRN: [id number] DOB/AGE: 05-16-1991 22 y.o.  Admit date: 08/01/2013 Discharge date: 08/03/2013  Admission Diagnoses:  Patient Active Problem List   Diagnosis Date Noted  . Dehydration 08/03/2013  . Ketotic hyperglycinemia 08/03/2013  . Preterm uterine contractions, antepartum 08/03/2013  . Polyhydramnios in third trimester, antepartum complication 08/03/2013  . LGA (large for gestational age) fetus affecting mother, antepartum 08/03/2013  . Fetal renal anomaly 08/03/2013  . Diabetes mellitus, antepartum 06/22/2013  . DM (diabetes mellitus), type 1, uncontrolled 01/26/2012  . Bipolar II disorder 01/20/2012    Class: Acute  . Tobacco abuse 12/24/2011     Discharge Diagnoses: Same  Prenatal Procedures: NST, ultrasound and IV hydration   Significant Diagnostic Studies:  Results for orders placed during the hospital encounter of 08/01/13 (from the past 168 hour(s))  URINALYSIS, ROUTINE W REFLEX MICROSCOPIC   Collection Time    08/01/13 11:25 PM      Result Value Range   Color, Urine YELLOW  YELLOW   APPearance CLEAR  CLEAR   Specific Gravity, Urine 1.025  1.005 - 1.030   pH 6.0  5.0 - 8.0   Glucose, UA >1000 (*) NEGATIVE mg/dL   Hgb urine dipstick TRACE (*) NEGATIVE   Bilirubin Urine NEGATIVE  NEGATIVE   Ketones, ur 40 (*) NEGATIVE mg/dL   Protein, ur NEGATIVE  NEGATIVE mg/dL   Urobilinogen, UA 0.2  0.0 - 1.0 mg/dL   Nitrite NEGATIVE  NEGATIVE   Leukocytes, UA NEGATIVE  NEGATIVE  URINE MICROSCOPIC-ADD ON   Collection Time    08/01/13 11:25 PM      Result Value Range   Squamous Epithelial / LPF RARE  RARE   WBC, UA    <3 WBC/hpf   Value: NO FORMED ELEMENTS SEEN ON URINE MICROSCOPIC EXAMINATION   RBC / HPF 0-2  <3 RBC/hpf   Bacteria, UA FEW (*) RARE  GLUCOSE, CAPILLARY   Collection Time    08/01/13 11:46 PM      Result Value Range   Glucose-Capillary 235 (*) 70 - 99 mg/dL  FETAL FIBRONECTIN   Collection Time    08/02/13  1:00 AM      Result Value Range   Fetal Fibronectin NEGATIVE  NEGATIVE  CBC   Collection Time    08/02/13  1:30 AM      Result Value Range   WBC 11.0 (*) 4.0 - 10.5 K/uL   RBC 3.80 (*) 3.87 - 5.11 MIL/uL   Hemoglobin 11.5 (*) 12.0 - 15.0 g/dL   HCT 40.9 (*) 81.1 - 91.4 %   MCV 86.6  78.0 - 100.0 fL   MCH 30.3  26.0 - 34.0 pg   MCHC 35.0  30.0 - 36.0 g/dL   RDW 78.2  95.6 - 21.3 %   Platelets 294  150 - 400 K/uL  BASIC METABOLIC PANEL   Collection Time    08/02/13  1:30 AM      Result Value Range   Sodium 133 (*) 135 - 145 mEq/L   Potassium 3.7  3.5 - 5.1 mEq/L   Chloride 98  96 - 112 mEq/L   CO2 19  19 - 32 mEq/L   Glucose, Bld 233 (*) 70 - 99 mg/dL   BUN 12  6 - 23 mg/dL   Creatinine, Ser 0.86 (*) 0.50 - 1.10 mg/dL   Calcium 9.1  8.4 - 57.8 mg/dL   GFR calc non Af Amer >90  >90 mL/min  GFR calc Af Amer >90  >90 mL/min  HEMOGLOBIN A1C   Collection Time    08/02/13  1:30 AM      Result Value Range   Hemoglobin A1C 9.5 (*) <5.7 %   Mean Plasma Glucose 226 (*) <117 mg/dL  WET PREP, GENITAL   Collection Time    08/02/13  2:35 AM      Result Value Range   Yeast Wet Prep HPF POC NONE SEEN  NONE SEEN   Trich, Wet Prep NONE SEEN  NONE SEEN   Clue Cells Wet Prep HPF POC NONE SEEN  NONE SEEN   WBC, Wet Prep HPF POC FEW (*) NONE SEEN  GLUCOSE, CAPILLARY   Collection Time    08/02/13  5:02 AM      Result Value Range   Glucose-Capillary 306 (*) 70 - 99 mg/dL  KETONES, QUALITATIVE   Collection Time    08/02/13  5:20 AM      Result Value Range   Acetone, Bld SMALL (*) NEGATIVE  GLUCOSE, RANDOM   Collection Time    08/02/13  5:20 AM      Result Value Range   Glucose, Bld 356 (*) 70 - 99 mg/dL  GLUCOSE, CAPILLARY   Collection Time    08/02/13  6:05 AM      Result Value Range   Glucose-Capillary 288 (*) 70 - 99 mg/dL  GLUCOSE, CAPILLARY   Collection Time    08/02/13  7:54 AM      Result Value Range   Glucose-Capillary 176 (*) 70 - 99  mg/dL   Comment 1 Documented in Chart     Comment 2 Notify RN    GLUCOSE, CAPILLARY   Collection Time    08/02/13 12:00 PM      Result Value Range   Glucose-Capillary 153 (*) 70 - 99 mg/dL   Comment 1 Notify RN     Comment 2 Documented in Chart    GLUCOSE, CAPILLARY   Collection Time    08/02/13  6:53 PM      Result Value Range   Glucose-Capillary 156 (*) 70 - 99 mg/dL   Comment 1 Notify RN     Comment 2 Documented in Chart    GLUCOSE, CAPILLARY   Collection Time    08/03/13  2:21 AM      Result Value Range   Glucose-Capillary 180 (*) 70 - 99 mg/dL  GLUCOSE, CAPILLARY   Collection Time    08/03/13  6:20 AM      Result Value Range   Glucose-Capillary 196 (*) 70 - 99 mg/dL   [id number]  Ultrasound: infant vertex, 4 lb 7 oz (>90%) at 30 wks, AC almost 5 wks ahead, poly with AFI of 25.77, absent left kidney vs. Ectopic location  FFN negative.  Treatments: IV hydration and Diabetes Education to adjust her insulin pump  Hospital Course:  This is a 22 y.o. [id number] with IUP at [redacted]w[redacted]d admitted for dehydration, preterm uterine contractions and ketosis.  BS noted to be elevated with elevated A1C. She was admitted with contractions, after vomiting all day.  BS elevated, with glucosuria and serum ketones.  CO was 19. Concern for ketosis leading to dehydration and PT contractions caused her admission.  Pt. Given IV hydration and correction of insulin pump.  She stopped contracting, FFN was negative. BS improved. U/S done by MFM shows LGA and poly.  She was deemed stable for discharge to home with outpatient follow up.  Discharge Exam: BP 112/70  Pulse 114  Temp(Src) 98.3 F (36.8 C) (Oral)  Resp 18  Ht 5\' 5"  (1.651 m)  Wt 150 lb (68.04 kg)  BMI 24.96 kg/m2  LMP 01/09/2013 General appearance: alert, cooperative and appears stated age Resp: normal effort GI: normal findings: soft, non-tender and gravid Extremities: extremities normal, atraumatic, no cyanosis or edema Skin: Skin  color, texture, turgor normal. No rashes or lesions FHR 140's, average variability, non-reactive but ok for gestational age, ocasional variable decel  Discharge Condition: Stable  Disposition: 01-Home or Self Care  Discharge Orders   Future Appointments Provider Department Dept Phone   08/03/2013 10:15 AM Lazaro Arms, MD [facility] [phone]   Future Orders Complete By Expires   Discharge activity:  Bathroom / Shower only  As directed    Discharge activity: Bedrest  As directed    Discharge diet:  As directed    Comments:     OB diabetic   Fetal Kick Count:  Lie on our left side for one hour after a meal, and count the number of times your baby kicks.  If it is less than 5 times, get up, move around and drink some juice.  Repeat the test 30 minutes later.  If it is still less than 5 kicks in an hour, notify your doctor.  As directed    No sexual activity restrictions  As directed    Notify physician for a general feeling that "something is not right"  As directed    Notify physician for increase or change in vaginal discharge  As directed    Notify physician for intestinal cramps, with or without diarrhea, sometimes described as "gas pain"  As directed    Notify physician for leaking of fluid  As directed    Notify physician for low, dull backache, unrelieved by heat or Tylenol  As directed    Notify physician for menstrual like cramps  As directed    Notify physician for pelvic pressure  As directed    Notify physician for uterine contractions.  These may be painless and feel like the uterus is tightening or the baby is  "balling up"  As directed    Notify physician for vaginal bleeding  As directed    PRETERM LABOR:  Includes any of the follwing symptoms that occur between 20 - [redacted] weeks gestation.  If these symptoms are not stopped, preterm labor can result in preterm delivery, placing your baby at risk  As directed        Medication List          butalbital-acetaminophen-caffeine 50-325-40 MG per tablet  Commonly known as:  FIORICET  Take 1-2 tablets by mouth every 6 (six) hours as needed for headache.     glucagon 1 MG injection  Commonly known as:  GLUCAGON EMERGENCY  Inject 1 mg into the vein once as needed. For severe hypoglycemia.     insulin aspart 100 UNIT/ML injection  Commonly known as:  novoLOG  Inject 1 Units into the skin continuous. 1 unit for every 7 grams of carbs.     insulin pump 100 unit/ml Soln  Inject 1 each into the skin continuous.           Follow-up Information   Follow up with [facility] In 1 week.   Contact information:   [address] [place] [address] [phone]      Signed: Reva Bores M.D. 08/03/2013, 7:52 AM

## 2013-08-05 NOTE — H&P (Signed)
FACULTY PRACTICE ANTEPARTUM(COMPREHENSIVE) NOTE  [name] is a 22 y.o. [id number] at [redacted]w[redacted]d by early ultrasound who is admitted for diabetic control, contractions.  Fetal presentation is cephalic.  Length of Stay: 1 Days  Subjective:patient has had labile CBG's thru the morning hours, was 233 At MN, incr to 306(4am), confirmed by serum glucose as 356, given pump correction dose, then 288 at 5:45 am, then given 8 units SQ, will rechk in 1 hr. Now 176  Contractions ceased with single dose procardia 20 , now on 10 mg q 6h.  Patient reports the fetal movement as active.  Patient reports uterine contraction activity as resolved  Patient reports vaginal bleeding as none.  Patient describes fluid per vagina as None.  Vitals: Blood pressure 125/72, pulse 105, temperature 98.2 F (36.8 C), temperature source Oral, resp. rate 18, height 5\' 5"  (1.651 m), weight 68.04 kg (150 lb), last menstrual period 01/09/2013.  Physical Examination:  General appearance - alert, well appearing, and in no distress  Heart - normal rate and regular rhythm  Abdomen - soft, nontender, nondistended  Fundal Height: size equals dates  Cervical Exam: Not evaluated. FFN negative from admit and Cx was 1/20/-2  Extremities: extremities normal, atraumatic, no cyanosis or edema and Homans sign is negative, no sign of DVT with DTRs 2+ bilaterally  Membranes:intact  Fetal Monitoring: Baseline: 150 bpm  Labs:  Results for orders placed during the hospital encounter of 08/01/13 (from the past 24 hour(s))   URINALYSIS, ROUTINE W REFLEX MICROSCOPIC    Collection Time    08/01/13 11:25 PM   Result  Value  Range    Color, Urine  YELLOW  YELLOW    APPearance  CLEAR  CLEAR    Specific Gravity, Urine  1.025  1.005 - 1.030    pH  6.0  5.0 - 8.0    Glucose, UA  >1000 (*)  NEGATIVE mg/dL    Hgb urine dipstick  TRACE (*)  NEGATIVE    Bilirubin Urine  NEGATIVE  NEGATIVE    Ketones, ur  40 (*)  NEGATIVE mg/dL    Protein, ur   NEGATIVE  NEGATIVE mg/dL    Urobilinogen, UA  0.2  0.0 - 1.0 mg/dL    Nitrite  NEGATIVE  NEGATIVE    Leukocytes, UA  NEGATIVE  NEGATIVE   URINE MICROSCOPIC-ADD ON    Collection Time    08/01/13 11:25 PM   Result  Value  Range    Squamous Epithelial / LPF  RARE  RARE    WBC, UA   <3 WBC/hpf    Value:  NO FORMED ELEMENTS SEEN ON URINE MICROSCOPIC EXAMINATION    RBC / HPF  0-2  <3 RBC/hpf    Bacteria, UA  FEW (*)  RARE   GLUCOSE, CAPILLARY    Collection Time    08/01/13 11:46 PM   Result  Value  Range    Glucose-Capillary  235 (*)  70 - 99 mg/dL   FETAL FIBRONECTIN    Collection Time    08/02/13 1:00 AM   Result  Value  Range    Fetal Fibronectin  NEGATIVE  NEGATIVE   CBC    Collection Time    08/02/13 1:30 AM   Result  Value  Range    WBC  11.0 (*)  4.0 - 10.5 K/uL    RBC  3.80 (*)  3.87 - 5.11 MIL/uL    Hemoglobin  11.5 (*)  12.0 - 15.0 g/dL  HCT  32.9 (*)  36.0 - 46.0 %    MCV  86.6  78.0 - 100.0 fL    MCH  30.3  26.0 - 34.0 pg    MCHC  35.0  30.0 - 36.0 g/dL    RDW  40.9  81.1 - 91.4 %    Platelets  294  150 - 400 K/uL   BASIC METABOLIC PANEL    Collection Time    08/02/13 1:30 AM   Result  Value  Range    Sodium  133 (*)  135 - 145 mEq/L    Potassium  3.7  3.5 - 5.1 mEq/L    Chloride  98  96 - 112 mEq/L    CO2  19  19 - 32 mEq/L    Glucose, Bld  233 (*)  70 - 99 mg/dL    BUN  12  6 - 23 mg/dL    Creatinine, Ser  7.82 (*)  0.50 - 1.10 mg/dL    Calcium  9.1  8.4 - 10.5 mg/dL    GFR calc non Af Amer  >90  >90 mL/min    GFR calc Af Amer  >90  >90 mL/min   WET PREP, GENITAL    Collection Time    08/02/13 2:35 AM   Result  Value  Range    Yeast Wet Prep HPF POC  NONE SEEN  NONE SEEN    Trich, Wet Prep  NONE SEEN  NONE SEEN    Clue Cells Wet Prep HPF POC  NONE SEEN  NONE SEEN    WBC, Wet Prep HPF POC  FEW (*)  NONE SEEN   GLUCOSE, CAPILLARY    Collection Time    08/02/13 5:02 AM   Result  Value  Range    Glucose-Capillary  306 (*)  70 - 99 mg/dL   KETONES,  QUALITATIVE    Collection Time    08/02/13 5:20 AM   Result  Value  Range    Acetone, Bld  SMALL (*)  NEGATIVE   GLUCOSE, RANDOM    Collection Time    08/02/13 5:20 AM   Result  Value  Range    Glucose, Bld  356 (*)  70 - 99 mg/dL   GLUCOSE, CAPILLARY    Collection Time    08/02/13 6:05 AM   Result  Value  Range    Glucose-Capillary  288 (*)  70 - 99 mg/dL    Imaging  Ultrasound ordered MFM  Medications: Scheduled  .  docusate sodium  100 mg  Oral  Daily   .  NIFEdipine  10 mg  Oral  Q6H   .  prenatal multivitamin  1 tablet  Oral  Q1200    I have reviewed the patient's current medications.  ASSESSMENT:  Patient Active Problem List    Diagnosis  Date Noted   .  Diabetes mellitus, antepartum  06/22/2013   .  DM (diabetes mellitus), type 1, uncontrolled  01/26/2012   .  Bipolar II disorder  01/20/2012     Class: Acute   .  Tobacco abuse  12/24/2011    PLAN:  1. Continue diabetic correction using pump  Consult [facility] Diab Coordinator this am ordered  Ob ultrasound for growth this am.  2. PTL- resolved with single dose procardia, continue 10 mg q 6h

## 2013-08-09 ENCOUNTER — Ambulatory Visit (INDEPENDENT_AMBULATORY_CARE_PROVIDER_SITE_OTHER): Payer: PRIVATE HEALTH INSURANCE | Admitting: Obstetrics and Gynecology

## 2013-08-09 VITALS — BP 112/70 | Wt 152.2 lb

## 2013-08-09 DIAGNOSIS — O99340 Other mental disorders complicating pregnancy, unspecified trimester: Secondary | ICD-10-CM

## 2013-08-09 DIAGNOSIS — O99019 Anemia complicating pregnancy, unspecified trimester: Secondary | ICD-10-CM

## 2013-08-09 DIAGNOSIS — Z1389 Encounter for screening for other disorder: Secondary | ICD-10-CM

## 2013-08-09 DIAGNOSIS — Z331 Pregnant state, incidental: Secondary | ICD-10-CM

## 2013-08-09 DIAGNOSIS — O09299 Supervision of pregnancy with other poor reproductive or obstetric history, unspecified trimester: Secondary | ICD-10-CM

## 2013-08-09 DIAGNOSIS — O09219 Supervision of pregnancy with history of pre-term labor, unspecified trimester: Secondary | ICD-10-CM

## 2013-08-09 DIAGNOSIS — O0993 Supervision of high risk pregnancy, unspecified, third trimester: Secondary | ICD-10-CM

## 2013-08-09 DIAGNOSIS — O24919 Unspecified diabetes mellitus in pregnancy, unspecified trimester: Secondary | ICD-10-CM

## 2013-08-09 LAB — POCT URINALYSIS DIPSTICK
Glucose, UA: 4
Leukocytes, UA: NEGATIVE
Protein, UA: NEGATIVE

## 2013-08-09 MED ORDER — TERCONAZOLE 0.4 % VA CREA: 1.0000 | TOPICAL_CREAM | Freq: Every day | VAGINAL | Status: DC

## 2013-08-09 NOTE — Progress Notes (Addendum)
cbg's remain high: seen today with Becky from [employer].  Pt agrees to an aggressive attempt to improve glucose control. Will check cbg's 8x/day for the next few days to allow Becky to review pt response to correction doses; over last 5 days, fastings range 199-384, pc's 180-296, (outliers of [phone]) Kriste Basque will work with pt and access her machine results after pt downloads biweekly. Current regimen changes:Midnight 1.25 u/hr                                            4 am      2.00                                            8 am      1.55                                            12pm     1.40 Carbs meal coverage 1U/6gCHO,  Correction Sensitivity 1U/30mg (may need to increase), target up/down=90mg /dl, .  Pt aware that admission an option.  Last Hgb A1C increased to 9.5, began pregnancy at 8.1. Pt agrees that she can increase effort, Kriste Basque will review results with pt biweekly

## 2013-08-09 NOTE — Addendum Note (Signed)
Addended by: Tilda Burrow on: 08/09/2013 10:05 AM   Modules accepted: Orders

## 2013-08-09 NOTE — Patient Instructions (Signed)
Thank you for the extra effort we're starting. Download twice weekly. See 1 week for cbg review here.(will begin Tues/Fri monitoring.

## 2013-08-09 NOTE — Progress Notes (Signed)
[email or web address]  Username___, Password____ [phone]  To access pt data

## 2013-08-13 ENCOUNTER — Telehealth: Payer: Self-pay | Admitting: *Deleted

## 2013-08-13 ENCOUNTER — Encounter: Payer: Self-pay | Admitting: Women's Health

## 2013-08-13 ENCOUNTER — Ambulatory Visit (INDEPENDENT_AMBULATORY_CARE_PROVIDER_SITE_OTHER): Payer: PRIVATE HEALTH INSURANCE | Admitting: Women's Health

## 2013-08-13 VITALS — BP 122/80 | Wt 156.5 lb

## 2013-08-13 DIAGNOSIS — O09299 Supervision of pregnancy with other poor reproductive or obstetric history, unspecified trimester: Secondary | ICD-10-CM

## 2013-08-13 DIAGNOSIS — O0990 Supervision of high risk pregnancy, unspecified, unspecified trimester: Secondary | ICD-10-CM | POA: Insufficient documentation

## 2013-08-13 DIAGNOSIS — O09219 Supervision of pregnancy with history of pre-term labor, unspecified trimester: Secondary | ICD-10-CM

## 2013-08-13 DIAGNOSIS — O99019 Anemia complicating pregnancy, unspecified trimester: Secondary | ICD-10-CM

## 2013-08-13 DIAGNOSIS — O24919 Unspecified diabetes mellitus in pregnancy, unspecified trimester: Secondary | ICD-10-CM

## 2013-08-13 DIAGNOSIS — O99340 Other mental disorders complicating pregnancy, unspecified trimester: Secondary | ICD-10-CM

## 2013-08-13 DIAGNOSIS — O24913 Unspecified diabetes mellitus in pregnancy, third trimester: Secondary | ICD-10-CM

## 2013-08-13 DIAGNOSIS — O0993 Supervision of high risk pregnancy, unspecified, third trimester: Secondary | ICD-10-CM

## 2013-08-13 DIAGNOSIS — O09293 Supervision of pregnancy with other poor reproductive or obstetric history, third trimester: Secondary | ICD-10-CM

## 2013-08-13 DIAGNOSIS — Z1389 Encounter for screening for other disorder: Secondary | ICD-10-CM

## 2013-08-13 DIAGNOSIS — Z331 Pregnant state, incidental: Secondary | ICD-10-CM

## 2013-08-13 LAB — POCT URINALYSIS DIPSTICK
Ketones, UA: NEGATIVE
Leukocytes, UA: NEGATIVE
Nitrite, UA: NEGATIVE
Protein, UA: NEGATIVE

## 2013-08-13 NOTE — Progress Notes (Signed)
Work-in for abdominal pain. Reports good fm. Denies regular uc's, lof, vb, urinary frequency, urgency, hesitancy, or dysuria. Developed generalized abdominal aching yesterday w/ occasional sharp lower abdominal pains. Denies fever/chills, n/v/d. States it doesn't really feel like uc's. BS + x 4 quads, slight generalized tenderness to palpation. Spec exam: small amount creamy white nonodorous d/c, cx visually closed, fFN collected but not sent d/t SVE: LTC.  Becky from [employer] had called prior to pt's arrival to report changes: Insulin increased by 10%, carb ratio from 1:6 to 1:5, sensitivity from 1:30 to 1:25. Reports Thurs avg was 57 units, now average is 72.5 units/day. Pt reports eating lots of carbs throughout day. Discussed importance of tight glycemic control during pregnancy, as well as potential complications if not well controlled. Pt states she will try to do better.  Discussed all w/ LHE.  Reviewed ptl s/s, fkc, reasons to call/return/go to [facility]  All questions answered. F/U on Frid for NST and visit w/ LHE as scheduled.  To begin 2x/wk testing.

## 2013-08-13 NOTE — Patient Instructions (Signed)
Diabetes Meal Planning Guide The diabetes meal planning guide is a tool to help you plan your meals and snacks. It is important for people with diabetes to manage their blood glucose (sugar) levels. Choosing the right foods and the right amounts throughout your day will help control your blood glucose. Eating right can even help you improve your blood pressure and reach or maintain a healthy weight. CARBOHYDRATE COUNTING MADE EASY When you eat carbohydrates, they turn to sugar. This raises your blood glucose level. Counting carbohydrates can help you control this level so you feel better. When you plan your meals by counting carbohydrates, you can have more flexibility in what you eat and balance your medicine with your food intake. Carbohydrate counting simply means adding up the total amount of carbohydrate grams in your meals and snacks. Try to eat about the same amount at each meal. Foods with carbohydrates are listed below. Each portion below is 1 carbohydrate serving or 15 grams of carbohydrates. Ask your dietician how many grams of carbohydrates you should eat at each meal or snack. Grains and Starches  1 slice bread.   English muffin or hotdog/hamburger bun.   cup cold cereal (unsweetened).   cup cooked pasta or rice.   cup starchy vegetables (corn, potatoes, peas, beans, winter squash).  1 tortilla (6 inches).   bagel.  1 waffle or pancake (size of a CD).   cup cooked cereal.  4 to 6 small crackers. *Whole grain is recommended. Fruit  1 cup fresh unsweetened berries, melon, papaya, pineapple.  1 small fresh fruit.   banana or mango.   cup fruit juice (4 oz unsweetened).   cup canned fruit in natural juice or water.  2 tbs dried fruit.  12 to 15 grapes or cherries. Milk and Yogurt  1 cup fat-free or 1% milk.  1 cup soy milk.  6 oz light yogurt with sugar-free sweetener.  6 oz low-fat soy yogurt.  6 oz plain yogurt. Vegetables  1 cup raw or  cup  cooked is counted as 0 carbohydrates or a "free" food.  If you eat 3 or more servings at 1 meal, count them as 1 carbohydrate serving. Other Carbohydrates   oz chips or pretzels.   cup ice cream or frozen yogurt.   cup sherbet or sorbet.  2 inch square cake, no frosting.  1 tbs honey, sugar, jam, jelly, or syrup.  2 small cookies.  3 squares of graham crackers.  3 cups popcorn.  6 crackers.  1 cup broth-based soup.  Count 1 cup casserole or other mixed foods as 2 carbohydrate servings.  Foods with less than 20 calories in a serving may be counted as 0 carbohydrates or a "free" food. You may want to purchase a book or computer software that lists the carbohydrate gram counts of different foods. In addition, the nutrition facts panel on the labels of the foods you eat are a good source of this information. The label will tell you how big the serving size is and the total number of carbohydrate grams you will be eating per serving. Divide this number by 15 to obtain the number of carbohydrate servings in a portion. Remember, 1 carbohydrate serving equals 15 grams of carbohydrate. SERVING SIZES Measuring foods and serving sizes helps you make sure you are getting the right amount of food. The list below tells how big or small some common serving sizes are.  1 oz.........4 stacked dice.  3 oz.........Deck of cards.  1 tsp........Tip   of little finger.  1 tbs......Marland KitchenMarland KitchenThumb.  2 tbs.......Marland KitchenGolf ball.   cup......Marland KitchenHalf of a fist.  1 cup.......Marland KitchenA fist. SAMPLE DIABETES MEAL PLAN Below is a sample meal plan that includes foods from the grain and starches, dairy, vegetable, fruit, and meat groups. A dietician can individualize a meal plan to fit your calorie needs and tell you the number of servings needed from each food group. However, controlling the total amount of carbohydrates in your meal or snack is more important than making sure you include all of the food groups at every  meal. You may interchange carbohydrate containing foods (dairy, starches, and fruits). The meal plan below is an example of a 2000 calorie diet using carbohydrate counting. This meal plan has 17 carbohydrate servings. Breakfast  1 cup oatmeal (2 carb servings).   cup light yogurt (1 carb serving).  1 cup blueberries (1 carb serving).   cup almonds. Snack  1 large apple (2 carb servings).  1 low-fat string cheese stick. Lunch  Chicken breast salad.  1 cup spinach.   cup chopped tomatoes.  2 oz chicken breast, sliced.  2 tbs low-fat [place] dressing.  12 whole-wheat crackers (2 carb servings).  12 to 15 grapes (1 carb serving).  1 cup low-fat milk (1 carb serving). Snack  1 cup carrots.   cup hummus (1 carb serving). Dinner  3 oz broiled salmon.  1 cup brown rice (3 carb servings). Snack  1  cups steamed broccoli (1 carb serving) drizzled with 1 tsp olive oil and lemon juice.  1 cup light pudding (2 carb servings). DIABETES MEAL PLANNING WORKSHEET Your dietician can use this worksheet to help you decide how many servings of foods and what types of foods are right for you.  BREAKFAST Food Group and Servings / Carb Servings Grain/Starches __________________________________ Dairy __________________________________________ Vegetable ______________________________________ Fruit ___________________________________________ Meat __________________________________________ Fat ____________________________________________ LUNCH Food Group and Servings / Carb Servings Grain/Starches ___________________________________ Dairy ___________________________________________ Fruit ____________________________________________ Meat ___________________________________________ Fat _____________________________________________ Laural Golden Food Group and Servings / Carb Servings Grain/Starches ___________________________________ Dairy  ___________________________________________ Fruit ____________________________________________ Meat ___________________________________________ Fat _____________________________________________ SNACKS Food Group and Servings / Carb Servings Grain/Starches ___________________________________ Dairy ___________________________________________ Vegetable _______________________________________ Fruit ____________________________________________ Meat ___________________________________________ Fat _____________________________________________ DAILY TOTALS Starches _________________________ Vegetable ________________________ Fruit ____________________________ Dairy ____________________________ Meat ____________________________ Fat ______________________________ Document Released: 06/17/2005 Document Revised: 12/13/2011 Document Reviewed: 04/28/2009 [employer] Patient Information 2014 [employer]  Type 1 or Type 2 Diabetes Mellitus During Pregnancy Diabetes mellitus, often simply referred to as diabetes, is a long-term (chronic) disease. Type 1 diabetes occurs when the islet cells in the pancreas that make insulin (a hormone) are destroyed and can no longer make insulin. Type 2 diabetes occurs when the pancreas does not make enough insulin, the cells are less responsive to the insulin that is made (insulin resistance), or both. Insulin is needed to move sugars from food into the tissue cells. The tissue cells use the sugars for energy. The lack of insulin or the lack of normal response to insulin causes excess sugars to build up in the blood instead of going into the tissue cells. As a result, high blood sugar (hyperglycemia) develops.  If blood glucose levels are kept in the normal range both before and during pregnancy, women can have a healthy pregnancy. If your blood glucose levels are not well controlled, there may be risks to you, your unborn baby (fetus), your labor and delivery, or your  newborn baby.  RISK FACTORS  You are predisposed to developing type 1 diabetes if  someone in your family has diabetes and you are exposed to certain environmental triggers.  You have an increased chance of developing type 2 diabetes if you have a family history of diabetes and also have one or more of the following risk factors:  Being overweight.  Having an inactive lifestyle.  Having a history of consistently eating high-calorie foods. SYMPTOMS The symptoms of diabetes include:  Increased thirst (polydipsia).  Increased urination (polyuria).  Increased urination during the night (nocturia).  Weight loss. This weight loss may be rapid.  Frequent, recurring infections.  Tiredness (fatigue).  Weakness.  Vision changes, such as blurred vision.  Fruity smell to your breath.  Abdominal pain.  Nausea or vomiting. DIAGNOSIS  Diabetes is diagnosed when blood glucose levels are increased. Your blood glucose level may be checked by one or more of the following blood tests:  A fasting blood glucose test. You will not be allowed to eat for at least 8 hours before a blood sample is taken.  A random blood glucose test. Your blood glucose is checked at any time of the day regardless of when you ate.  A hemoglobin A1c blood glucose test. A hemoglobin A1c test provides information about blood glucose control over the previous 3 months.  An oral glucose tolerance test (OGTT). Your blood glucose is measured after you have not eaten (fasted) for 1 3 hours and then after you drink a glucose-containing beverage. An OGTT is usually performed during weeks 24 28 of your pregnancy. TREATMENT   You will need to take diabetes medicine or insulin daily to keep blood glucose levels in the desired range.  You will need to match insulin dosing with exercise and healthy food choices. The treatment goal is to maintain the before-meal (preprandial), bedtime, and overnight blood glucose level at  60 99 mg/dL during pregnancy. The treatment goal is to further maintain the peak after-meal blood sugar (postprandial glucose) level at 100 140 mg/dL.  HOME CARE INSTRUCTIONS   Have your hemoglobin A1c level checked twice a year.  Perform daily blood glucose monitoring as directed by your caregiver. It is common to perform frequent blood glucose monitoring.  Monitor urine ketones when you are ill and as directed by your caregiver.  Take your diabetes medicine and insulin as directed by your caregiver to maintain your blood glucose level in the desired range.  Never run out of diabetes medicine or insulin. It is needed every day.  Adjust insulin based on your intake of carbohydrates. Carbohydrates can raise blood glucose levels but need to be included in your diet. Carbohydrates provide vitamins, minerals, and fiber, which are an essential part of a healthy diet. Carbohydrates are found in fruits, vegetables, whole grains, dairy products, legumes, and foods containing added sugars.  Eat healthy foods. Alternate 3 meals with 3 snacks.  Maintain a healthy weight gain. The usual total expected weight gain varies according to your prepregnancy body mass index (BMI).  Carry a medical alert card or wear medical alert jewelry.  Carry a 15 gram carbohydrate snack with you at all times to treat low blood sugar (hypoglycemia). Some examples of 15 gram carbohydrate snacks include:  Glucose tablets, 3 or 4.  Glucose gel, 15 gram tube.  Raisins, 2 tablespoons (24 grams).  Jelly beans, 6.  Animal crackers, 8.  Fruit juice, regular soda, or low fat milk, 4 ounces (120 mL).  Gummy treats, 9.  Recognize hypoglycemia. Hypoglycemia during pregnancy occurs with blood glucose levels of 60 mg/dL and below.  The risk for hypoglycemia increases when fasting or skipping meals, during or after intense exercise, and during sleep. Hypoglycemia symptoms can include:  Tremors or shakes.  Decreased  ability to concentrate.  Sweating.  Increased heart rate.  Headache.  Dry mouth.  Hunger.  Irritability.  Anxiety.  Restless sleep.  Altered speech or coordination.  Confusion.  Treat hypoglycemia promptly. If you are alert and able to safely swallow, follow the 15:15 rule:  Take 15 20 grams of rapid-acting glucose or carbohydrate. Rapid-acting options include glucose gel, glucose tablets, or 4 ounces (120 mL) of fruit juice, regular soda, or low-fat milk.  Check your blood glucose level 15 minutes after taking the glucose.  Take 15 20 grams more of glucose if the repeat blood glucose level is still 70 mg/dL or below.  Eat a meal or snack within 1 hour once blood glucose levels return to normal.  Engage in at least 30 minutes of physical activity a day or as directed by your caregiver. Ten minutes of physical activity timed 30 minutes after each meal is encouraged to control postprandial blood glucose levels.   Be alert to polyuria and polydipsia, which are early signs of hyperglycemia. An early awareness of hyperglycemia allows for prompt treatment. Treat hyperglycemia as directed by your caregiver.  Adjust your insulin dosing and food intake as needed if you start a new exercise or sport.  Follow your sick day plan at any time you are unable to eat or drink as usual.  Avoid tobacco and alcohol use.  Follow up with your caregiver regularly.  Follow the advice of your caregiver regarding your prenatal and post-delivery (postpartum) appointments, meal planning, exercise, medicines, vitamins, blood tests, other medical tests, and physical activities.  Continue daily skin and foot care. Examine your skin and feet daily for cuts, bruises, redness, nail problems, bleeding, blisters, or sores. A foot exam by a caregiver should be done annually.  Brush your teeth and gums at least twice a day and floss at least once a day. Follow up with your dentist regularly.  Schedule  an eye exam during the first trimester of your pregnancy or as directed by your caregiver.  Share your diabetes management plan with your workplace or school.  Stay up-to-date with immunizations.  Learn to manage stress.  Obtain ongoing diabetes education and support as needed. SEEK MEDICAL CARE IF:   You are unable to eat food or drink fluids for more than 6 hours.  You have nausea and vomiting for more than 6 hours.  You have a blood glucose level of 200 mg/dL and you have ketones in your urine.  There is a change in mental status.  You develop vision problems.  You have a persistent headache.  You have upper abdominal pain or discomfort.  You develop an additional serious illness.  You have diarrhea for more than 6 hours.  You have been sick or have had a fever for a couple of days and are not getting better. SEEK IMMEDIATE MEDICAL CARE IF:  You have difficulty breathing.  You no longer feel the baby moving.  You are bleeding or have discharge from your vagina.  You start having premature contractions or labor. MAKE SURE YOU:  Understand these instructions.  Will watch your condition.  Will get help right away if you are not doing well or get worse. Document Released: 06/14/2012 Document Revised: 09/06/2012 Document Reviewed: 06/14/2012 [facility] Patient Information 2014 [place], [place].

## 2013-08-13 NOTE — Telephone Encounter (Signed)
Pt states abdominal pain since Saturday am increasingly worse, back pain last night and today. Tylenol not helping. Pt to be here today at 11:45 to be seen.

## 2013-08-17 ENCOUNTER — Ambulatory Visit (INDEPENDENT_AMBULATORY_CARE_PROVIDER_SITE_OTHER): Payer: PRIVATE HEALTH INSURANCE | Admitting: Obstetrics & Gynecology

## 2013-08-17 ENCOUNTER — Encounter: Payer: Self-pay | Admitting: Obstetrics & Gynecology

## 2013-08-17 VITALS — BP 80/40 | Wt 158.0 lb

## 2013-08-17 DIAGNOSIS — Z331 Pregnant state, incidental: Secondary | ICD-10-CM

## 2013-08-17 DIAGNOSIS — O09299 Supervision of pregnancy with other poor reproductive or obstetric history, unspecified trimester: Secondary | ICD-10-CM

## 2013-08-17 DIAGNOSIS — O24913 Unspecified diabetes mellitus in pregnancy, third trimester: Secondary | ICD-10-CM

## 2013-08-17 DIAGNOSIS — O24919 Unspecified diabetes mellitus in pregnancy, unspecified trimester: Secondary | ICD-10-CM

## 2013-08-17 DIAGNOSIS — O99340 Other mental disorders complicating pregnancy, unspecified trimester: Secondary | ICD-10-CM

## 2013-08-17 DIAGNOSIS — O99019 Anemia complicating pregnancy, unspecified trimester: Secondary | ICD-10-CM

## 2013-08-17 DIAGNOSIS — Z1389 Encounter for screening for other disorder: Secondary | ICD-10-CM

## 2013-08-17 DIAGNOSIS — O09219 Supervision of pregnancy with history of pre-term labor, unspecified trimester: Secondary | ICD-10-CM

## 2013-08-17 LAB — POCT URINALYSIS DIPSTICK
Blood, UA: NEGATIVE
Glucose, UA: 4
Leukocytes, UA: NEGATIVE
Nitrite, UA: NEGATIVE
Protein, UA: NEGATIVE

## 2013-08-17 NOTE — Progress Notes (Signed)
Reactive NST, because of class schedule will do sonogram next Friday BS get an F, adjustments mad throughout, deep discussion regarding diet. BP weight and urine results all reviewed and noted. Patient reports good fetal movement, denies any bleeding and no rupture of membranes symptoms or regular contractions. Patient is without complaints. All questions were answered.

## 2013-08-20 ENCOUNTER — Telehealth: Payer: Self-pay | Admitting: Obstetrics and Gynecology

## 2013-08-24 ENCOUNTER — Ambulatory Visit (INDEPENDENT_AMBULATORY_CARE_PROVIDER_SITE_OTHER): Payer: PRIVATE HEALTH INSURANCE | Admitting: Obstetrics & Gynecology

## 2013-08-24 ENCOUNTER — Other Ambulatory Visit: Payer: Self-pay | Admitting: Obstetrics & Gynecology

## 2013-08-24 ENCOUNTER — Ambulatory Visit (INDEPENDENT_AMBULATORY_CARE_PROVIDER_SITE_OTHER): Payer: PRIVATE HEALTH INSURANCE

## 2013-08-24 ENCOUNTER — Encounter: Payer: Self-pay | Admitting: Obstetrics & Gynecology

## 2013-08-24 VITALS — BP 120/8 | Wt 161.2 lb

## 2013-08-24 DIAGNOSIS — O24913 Unspecified diabetes mellitus in pregnancy, third trimester: Secondary | ICD-10-CM

## 2013-08-24 DIAGNOSIS — O09219 Supervision of pregnancy with history of pre-term labor, unspecified trimester: Secondary | ICD-10-CM

## 2013-08-24 DIAGNOSIS — O24919 Unspecified diabetes mellitus in pregnancy, unspecified trimester: Secondary | ICD-10-CM

## 2013-08-24 DIAGNOSIS — O99019 Anemia complicating pregnancy, unspecified trimester: Secondary | ICD-10-CM

## 2013-08-24 DIAGNOSIS — O09299 Supervision of pregnancy with other poor reproductive or obstetric history, unspecified trimester: Secondary | ICD-10-CM

## 2013-08-24 DIAGNOSIS — Z331 Pregnant state, incidental: Secondary | ICD-10-CM

## 2013-08-24 DIAGNOSIS — Z1389 Encounter for screening for other disorder: Secondary | ICD-10-CM

## 2013-08-24 DIAGNOSIS — O99340 Other mental disorders complicating pregnancy, unspecified trimester: Secondary | ICD-10-CM

## 2013-08-24 LAB — POCT URINALYSIS DIPSTICK
Blood, UA: NEGATIVE
Glucose, UA: 4
Nitrite, UA: NEGATIVE

## 2013-08-24 NOTE — Addendum Note (Signed)
Addended by: Richardson Chiquito on: 08/24/2013 12:00 PM   Modules accepted: Orders

## 2013-08-24 NOTE — Telephone Encounter (Signed)
Was seen by Dr.Eure 08-24-13.closing encounter

## 2013-08-24 NOTE — Progress Notes (Signed)
Blood sugars are better of course still suboptimal, pt states she is doing better on diet New Regimen: [id number]         1.7 0400-0600      2.5 0600-1200      2.05 1200-MN        1.9 Sonogram noted and report done.  Good findings Due to Thanksgiving will do sono evaluation and the next week go to a Tues/Fri interval of testing

## 2013-08-24 NOTE — Progress Notes (Signed)
U/S(33+1wks)-vtx active fetus, EFW 6 lb 1 oz (83rd%tile), fluid WNL AFI-20.7cm, anterior gr 1 placenta, female fetus "[name]", BPP 8/8, UA Doppler RI-0.67 & 0.64, (Fetal) Lt kidney noted although Rt kidney more prominent, Dr. Despina Hidden present in exam room

## 2013-08-26 ENCOUNTER — Inpatient Hospital Stay (HOSPITAL_COMMUNITY)
Admission: AD | Admit: 2013-08-26 | Discharge: 2013-08-26 | Disposition: A | Discharge status: Home / Self Care | Payer: PRIVATE HEALTH INSURANCE | Source: Ambulatory Visit | Attending: Obstetrics & Gynecology | Admitting: Obstetrics & Gynecology

## 2013-08-26 ENCOUNTER — Encounter (HOSPITAL_COMMUNITY): Payer: Self-pay | Admitting: *Deleted

## 2013-08-26 DIAGNOSIS — E109 Type 1 diabetes mellitus without complications: Secondary | ICD-10-CM | POA: Insufficient documentation

## 2013-08-26 DIAGNOSIS — IMO0002 Reserved for concepts with insufficient information to code with codable children: Secondary | ICD-10-CM | POA: Insufficient documentation

## 2013-08-26 DIAGNOSIS — O1203 Gestational edema, third trimester: Secondary | ICD-10-CM

## 2013-08-26 DIAGNOSIS — O479 False labor, unspecified: Secondary | ICD-10-CM

## 2013-08-26 DIAGNOSIS — Z794 Long term (current) use of insulin: Secondary | ICD-10-CM | POA: Insufficient documentation

## 2013-08-26 DIAGNOSIS — O24919 Unspecified diabetes mellitus in pregnancy, unspecified trimester: Secondary | ICD-10-CM | POA: Insufficient documentation

## 2013-08-26 DIAGNOSIS — O4700 False labor before 37 completed weeks of gestation, unspecified trimester: Secondary | ICD-10-CM | POA: Insufficient documentation

## 2013-08-26 LAB — CBC
HCT: 33.2 % — ABNORMAL LOW (ref 36.0–46.0)
MCH: 29.2 pg (ref 26.0–34.0)
MCHC: 33.7 g/dL (ref 30.0–36.0)
MCV: 86.7 fL (ref 78.0–100.0)
Platelets: 219 10*3/uL (ref 150–400)
RDW: 12.6 % (ref 11.5–15.5)
WBC: 8.9 10*3/uL (ref 4.0–10.5)

## 2013-08-26 LAB — COMPREHENSIVE METABOLIC PANEL
AST: 11 U/L (ref 0–37)
Albumin: 2.3 g/dL — ABNORMAL LOW (ref 3.5–5.2)
Alkaline Phosphatase: 88 U/L (ref 39–117)
BUN: 12 mg/dL (ref 6–23)
Calcium: 8.8 mg/dL (ref 8.4–10.5)
Creatinine, Ser: 0.53 mg/dL (ref 0.50–1.10)
GFR calc Af Amer: >90 mL/min (ref >90)
Potassium: 4.1 mEq/L (ref 3.5–5.1)
Total Protein: 5.7 g/dL — ABNORMAL LOW (ref 6.0–8.3)

## 2013-08-26 LAB — URINALYSIS, ROUTINE W REFLEX MICROSCOPIC
Bilirubin Urine: NEGATIVE
Glucose, UA: >1000 mg/dL — AB
Hgb urine dipstick: NEGATIVE
Leukocytes, UA: NEGATIVE
Protein, ur: NEGATIVE mg/dL
Urobilinogen, UA: 0.2 mg/dL (ref 0.0–1.0)

## 2013-08-26 LAB — PROTEIN / CREATININE RATIO, URINE
Creatinine, Urine: 44.26 mg/dL
Total Protein, Urine: 7 mg/dL

## 2013-08-26 LAB — URINE MICROSCOPIC-ADD ON

## 2013-08-26 LAB — GLUCOSE, CAPILLARY: Glucose-Capillary: 239 mg/dL — ABNORMAL HIGH (ref 70–99)

## 2013-08-26 NOTE — Progress Notes (Signed)
CSN: [id number]  Arrival date and time: 08/26/13 1729  First Provider Initiated Contact with Patient 08/26/13 1904  Chief Complaint   Patient presents with   .  swelling of feet and legs    HPI [name] is a 22yo [id number] at 33.4wks who presents for eval of swelling in feet and legs since 11/21. Denies H/A, RUQ pain or visual disturbances. No ctx, leaking, or bldg. Her preg has been followed by the [facility] service and has been remarkable for 1) Class C IDDM with pump- uncontrolled 2) prev 35wk SVD- IOL for preeclampsia 3) EFW 83rd% last week/AFI 20cm 4) smaller left fetal kidney .  Pt denies being able to feel contractions that are seen on monitor.  Reports intercourse in past 24 hours.      OB History    Grav  Para  Term  Preterm  Abortions  TAB  SAB  Ect  Mult  Living    3  1  0  1  1  0  0  1  0  1      Past Medical History   Diagnosis  Date   .  Corneal abrasion    .  Candidiasis    .  Tubal pregnancy    .  Urinary tract infection    .  Bipolar disorder    .  Pregnancy    .  Pregnancy induced hypertension  2013   .  Preterm labor  08/02/13     admitted to Antenatal 08/02/13   .  IDDM (insulin dependent diabetes mellitus)      type 1   .  Diabetes mellitus      dx age 42   .  Diabetes mellitus type I     Past Surgical History   Procedure  Laterality  Date   .  Wisdom tooth extraction   2012    Family History   Problem  Relation  Age of Onset   .  Anesthesia problems  Neg Hx    .  Cancer  Maternal Grandmother      breast   .  Hypertension  Maternal Grandmother    .  Cancer  Maternal Grandfather      throat and lung    History   Substance Use Topics   .  Smoking status:  Current Some Day Smoker -- 0.25 packs/day for 2 years     Types:  Cigarettes   .  Smokeless tobacco:  Never Used      Comment: quit with + preg   .  Alcohol Use:  No    Allergies: No Known Allergies  Prescriptions prior to admission   Medication  Sig  Dispense  Refill   .  acetaminophen  (TYLENOL) 500 MG tablet  Take 1,500 mg by mouth every 6 (six) hours as needed (pain).     .  butalbital-acetaminophen-caffeine (FIORICET) 50-325-40 MG per tablet  Take 1-2 tablets by mouth every 6 (six) hours as needed for headache.  20 tablet  0   .  Insulin Human (INSULIN PUMP) 100 unit/ml SOLN  Inject 1 each into the skin 3 times daily with meals, bedtime and 2 AM. Novolog     .  glucagon (GLUCAGON EMERGENCY) 1 MG injection  Inject 1 mg into the vein once as needed. For severe hypoglycemia.  2 each  prn   .  terconazole (TERAZOL 7) 0.4 % vaginal cream  Place 1 applicator vaginally at bedtime.  45  g  2    ROS  Physical Exam   Blood pressure 124/85, pulse 114, temperature 98.4 F (36.9 C), temperature source Oral, resp. rate 18, height 5\' 5"  (1.651 m), weight 76.204 kg (168 lb).  BPs in MAU: 124-133/82-85 (sl elevated compared to baseline)  Physical Exam  Constitutional: She is oriented to person, place, and time. She appears well-developed.  HENT:  Head: Normocephalic.  Cardiovascular: Normal rate.  Respiratory: Effort normal.  GI:  FHR 150s +accels, no decels, Cat 1 Toco: mild irreg ctx/irritability  Musculoskeletal: Normal range of motion.  BLE pitting edema 2-3+ to knees No clonus, nl DTRs  Neurological: She is alert and oriented to person, place, and time.  Skin: Skin is warm and dry.  Psychiatric: She has a normal mood and affect. Her behavior is normal. Thought content normal.   CBC    Component  Value  Date/Time    WBC  8.9  08/26/2013 1852    RBC  3.83*  08/26/2013 1852    HGB  11.2*  08/26/2013 1852    HCT  33.2*  08/26/2013 1852    PLT  219  08/26/2013 1852    MCV  86.7  08/26/2013 1852    MCH  29.2  08/26/2013 1852    MCHC  33.7  08/26/2013 1852    RDW  12.6  08/26/2013 1852    LYMPHSABS  2.8  04/09/2013 1839    MONOABS  0.5  04/09/2013 1839    EOSABS  0.2  04/09/2013 1839    BASOSABS  0.0  04/09/2013 1839    Urinalysis    Component  Value  Date/Time    COLORURINE   YELLOW  08/26/2013 1745    APPEARANCEUR  CLEAR  08/26/2013 1745    LABSPEC  1.015  08/26/2013 1745    PHURINE  6.0  08/26/2013 1745    GLUCOSEU  >1000*  08/26/2013 1745    HGBUR  NEGATIVE  08/26/2013 1745    BILIRUBINUR  NEGATIVE  08/26/2013 1745    BILIRUBINUR  neg  05/21/2013 1232    KETONESUR  15*  08/26/2013 1745    PROTEINUR  NEGATIVE  08/26/2013 1745    UROBILINOGEN  0.2  08/26/2013 1745    NITRITE  NEGATIVE  08/26/2013 1745    NITRITE  neg  08/24/2013 1159    LEUKOCYTESUR  NEGATIVE  08/26/2013 1745    CBG 239  MAU Course   Procedures  Assessment and Plan   IUP at 33.4wks  Class C IDDM  BLE edema in preg  SHAW, KIMBERLY  08/26/2013, 7:05 PM   2045 In to assess patient, contractions noted on monitor, pt reports not feeling them.  Cervical exam shows Dilation: Closed Effacement (%): Thick Cervical Position: Posterior Exam by:: Roney Marion, CNM  Due to patient being a poorly controlled Type I Diabetic and inability to do FFN due to recent intercourse will not do BMZ, but will have patient reassessed in office at appointment on Tuesday.  Reviewed signs of preterm labor; pt verbalizes understanding and states "I know what contractions are and these are not it".    Plan: Discharge to home Appointment on Tuesday Advised to elevate extremities  Reviewed signs of preeclampsia [facility]

## 2013-08-26 NOTE — MAU Note (Signed)
Gradual increase in swelling. Much worse today. Patient is diabetic. States blood sugars have been in the 200's. States provider trying to readjust insulin dose.

## 2013-08-26 NOTE — MAU Provider Note (Signed)
History     CSN: [id number]  Arrival date and time: 08/26/13 1729   First Provider Initiated Contact with Patient 08/26/13 1904      Chief Complaint  Patient presents with  . swelling of feet and legs    HPI [name] is a 22yo [id number] at 33.4wks who presents for eval of swelling in feet and legs since 11/21. Denies H/A, RUQ pain or visual disturbances. No ctx, leaking, or bldg. Her preg has been followed by the [facility] service and has been remarkable for 1) Class C IDDM with pump- uncontrolled 2) prev 35wk SVD- IOL for preeclampsia 3) EFW 83rd% last week/AFI 20cm 4) smaller left fetal kidney  OB History   Grav Para Term Preterm Abortions TAB SAB Ect Mult Living   3 1 0 1 1 0 0 1 0 1       Past Medical History  Diagnosis Date  . Corneal abrasion   . Candidiasis   . Tubal pregnancy   . Urinary tract infection   . Bipolar disorder   . Pregnancy   . Pregnancy induced hypertension 2013  . Preterm labor 08/02/13    admitted to Antenatal 08/02/13  . 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 Diabetes mellitus     dx age 54  . Diabetes mellitus type I     Past Surgical History  Procedure Laterality Date  . Wisdom tooth extraction  2012    Family History  Problem Relation Age of Onset  . Anesthesia problems Neg Hx   . Cancer Maternal Grandmother     breast  . Hypertension Maternal Grandmother   . Cancer Maternal Grandfather     throat and lung    History  Substance Use Topics  . Smoking status: Current Some Day Smoker -- 0.25 packs/day for 2 years    Types: Cigarettes  . Smokeless tobacco: Never Used     Comment: quit with + preg  . Alcohol Use: No    Allergies: No Known Allergies  Prescriptions prior to admission  Medication Sig Dispense Refill  . acetaminophen (TYLENOL) 500 MG tablet Take 1,500 mg by mouth every 6 (six) hours as needed (pain).      . butalbital-acetaminophen-caffeine (FIORICET) 50-325-40 MG per tablet Take 1-2 tablets  by mouth every 6 (six) hours as needed for headache.  20 tablet  0  . Insulin Human (INSULIN PUMP) 100 unit/ml SOLN Inject 1 each into the skin 3 times daily with meals, bedtime and 2 AM. Novolog      . glucagon (GLUCAGON EMERGENCY) 1 MG injection Inject 1 mg into the vein once as needed. For severe hypoglycemia.  2 each  prn  . terconazole (TERAZOL 7) 0.4 % vaginal cream Place 1 applicator vaginally at bedtime.  45 g  2    ROS Physical Exam   Blood pressure 124/85, pulse 114, temperature 98.4 F (36.9 C), temperature source Oral, resp. rate 18, height 5\' 5"  (1.651 m), weight 76.204 kg (168 lb).  BPs in MAU: 124-133/82-85 (sl elevated compared to baseline)  Physical Exam  Constitutional: She is oriented to person, place, and time. She appears well-developed.  HENT:  Head: Normocephalic.  Cardiovascular: Normal rate.   Respiratory: Effort normal.  GI:  FHR 150s +accels, no decels, Cat 1 Toco: mild irreg ctx/irritability  Musculoskeletal: Normal range of motion.  BLE pitting edema 2-3+ to knees No clonus, nl DTRs  Neurological: She is alert and oriented to person, place,  and time.  Skin: Skin is warm and dry.  Psychiatric: She has a normal mood and affect. Her behavior is normal. Thought content normal.   CBC    Component Value Date/Time   WBC 8.9 08/26/2013 1852   RBC 3.83* 08/26/2013 1852   HGB 11.2* 08/26/2013 1852   HCT 33.2* 08/26/2013 1852   PLT 219 08/26/2013 1852   MCV 86.7 08/26/2013 1852   MCH 29.2 08/26/2013 1852   MCHC 33.7 08/26/2013 1852   RDW 12.6 08/26/2013 1852   LYMPHSABS 2.8 04/09/2013 1839   MONOABS 0.5 04/09/2013 1839   EOSABS 0.2 04/09/2013 1839   BASOSABS 0.0 04/09/2013 1839   Urinalysis    Component Value Date/Time   COLORURINE YELLOW 08/26/2013 1745   APPEARANCEUR CLEAR 08/26/2013 1745   LABSPEC 1.015 08/26/2013 1745   PHURINE 6.0 08/26/2013 1745   GLUCOSEU >1000* 08/26/2013 1745   HGBUR NEGATIVE 08/26/2013 1745   BILIRUBINUR NEGATIVE 08/26/2013  1745   BILIRUBINUR neg 05/21/2013 1232   KETONESUR 15* 08/26/2013 1745   PROTEINUR NEGATIVE 08/26/2013 1745   UROBILINOGEN 0.2 08/26/2013 1745   NITRITE NEGATIVE 08/26/2013 1745   NITRITE neg 08/24/2013 1159   LEUKOCYTESUR NEGATIVE 08/26/2013 1745   CBG 239  MAU Course  Procedures   Assessment and Plan  IUP at 33.4wks Class C IDDM BLE edema in preg  Signed pt out to [facility] CNM for tx plan  Cam Hai 08/26/2013, 7:05 PM

## 2013-08-27 LAB — URINE CULTURE: Colony Count: NO GROWTH

## 2013-08-27 NOTE — Progress Notes (Signed)
Attestation of Attending Supervision of Advanced Practitioner (CNM/NP): Evaluation and management procedures were performed by the Advanced Practitioner under my supervision and collaboration. I have reviewed the Advanced Practitioner's note and chart, and I agree with the management and plan.  Dianne Bady H. 6:22 AM   

## 2013-08-28 ENCOUNTER — Ambulatory Visit (INDEPENDENT_AMBULATORY_CARE_PROVIDER_SITE_OTHER): Payer: PRIVATE HEALTH INSURANCE | Admitting: Obstetrics & Gynecology

## 2013-08-28 ENCOUNTER — Encounter: Payer: Self-pay | Admitting: Obstetrics & Gynecology

## 2013-08-28 ENCOUNTER — Ambulatory Visit (INDEPENDENT_AMBULATORY_CARE_PROVIDER_SITE_OTHER): Payer: PRIVATE HEALTH INSURANCE

## 2013-08-28 ENCOUNTER — Other Ambulatory Visit: Payer: Self-pay | Admitting: Obstetrics & Gynecology

## 2013-08-28 VITALS — BP 120/70 | Wt 168.0 lb

## 2013-08-28 DIAGNOSIS — Z331 Pregnant state, incidental: Secondary | ICD-10-CM

## 2013-08-28 DIAGNOSIS — O99340 Other mental disorders complicating pregnancy, unspecified trimester: Secondary | ICD-10-CM

## 2013-08-28 DIAGNOSIS — O24913 Unspecified diabetes mellitus in pregnancy, third trimester: Secondary | ICD-10-CM

## 2013-08-28 DIAGNOSIS — O24919 Unspecified diabetes mellitus in pregnancy, unspecified trimester: Secondary | ICD-10-CM

## 2013-08-28 DIAGNOSIS — O09219 Supervision of pregnancy with history of pre-term labor, unspecified trimester: Secondary | ICD-10-CM

## 2013-08-28 DIAGNOSIS — O09299 Supervision of pregnancy with other poor reproductive or obstetric history, unspecified trimester: Secondary | ICD-10-CM

## 2013-08-28 DIAGNOSIS — Z1389 Encounter for screening for other disorder: Secondary | ICD-10-CM

## 2013-08-28 DIAGNOSIS — O99019 Anemia complicating pregnancy, unspecified trimester: Secondary | ICD-10-CM

## 2013-08-28 LAB — POCT URINALYSIS DIPSTICK
Blood, UA: NEGATIVE
Ketones, UA: NEGATIVE
Nitrite, UA: NEGATIVE
Protein, UA: 1

## 2013-08-28 MED ORDER — INSULIN PUMP: 1.0000 | Freq: Three times a day (TID) | SUBCUTANEOUS | Status: DC

## 2013-08-28 NOTE — Progress Notes (Signed)
Sonogram normal with good fetal evaluation BP weight and urine results all reviewed and noted. Patient reports good fetal movement, denies any bleeding and no rupture of membranes symptoms or regular contractions. Patient is without complaints. All questions were answered. Pump adjusted up 0.2 for each time setting

## 2013-08-28 NOTE — Progress Notes (Signed)
U/S(33+5wks)-actve vertex fetus, BPP 8/8, fluid wnl AFI-20.1cm, UA Doppler RI-0.64 & 0.61, anterior Gr 1 placenta

## 2013-08-28 NOTE — Addendum Note (Signed)
Addended by: Richardson Chiquito on: 08/28/2013 12:22 PM   Modules accepted: Orders

## 2013-09-02 NOTE — MAU Provider Note (Signed)
Attestation of Attending Supervision of Advanced Practitioner (CNM/NP): Evaluation and management procedures were performed by the Advanced Practitioner under my supervision and collaboration. I have reviewed the Advanced Practitioner's note and chart, and I agree with the management and plan.  Patricia Reach H. 4:39 PM

## 2013-09-03 ENCOUNTER — Other Ambulatory Visit: Payer: Self-pay | Admitting: Obstetrics & Gynecology

## 2013-09-03 DIAGNOSIS — O24913 Unspecified diabetes mellitus in pregnancy, third trimester: Secondary | ICD-10-CM

## 2013-09-04 ENCOUNTER — Ambulatory Visit (INDEPENDENT_AMBULATORY_CARE_PROVIDER_SITE_OTHER): Payer: PRIVATE HEALTH INSURANCE | Admitting: Obstetrics & Gynecology

## 2013-09-04 ENCOUNTER — Ambulatory Visit (INDEPENDENT_AMBULATORY_CARE_PROVIDER_SITE_OTHER): Payer: PRIVATE HEALTH INSURANCE

## 2013-09-04 ENCOUNTER — Encounter (INDEPENDENT_AMBULATORY_CARE_PROVIDER_SITE_OTHER): Payer: Self-pay

## 2013-09-04 ENCOUNTER — Other Ambulatory Visit: Payer: Self-pay | Admitting: Obstetrics & Gynecology

## 2013-09-04 ENCOUNTER — Encounter: Payer: Self-pay | Admitting: Obstetrics & Gynecology

## 2013-09-04 VITALS — BP 120/70 | Wt 170.0 lb

## 2013-09-04 DIAGNOSIS — O24913 Unspecified diabetes mellitus in pregnancy, third trimester: Secondary | ICD-10-CM

## 2013-09-04 DIAGNOSIS — Z1389 Encounter for screening for other disorder: Secondary | ICD-10-CM

## 2013-09-04 DIAGNOSIS — O24919 Unspecified diabetes mellitus in pregnancy, unspecified trimester: Secondary | ICD-10-CM

## 2013-09-04 DIAGNOSIS — O99019 Anemia complicating pregnancy, unspecified trimester: Secondary | ICD-10-CM

## 2013-09-04 DIAGNOSIS — O09299 Supervision of pregnancy with other poor reproductive or obstetric history, unspecified trimester: Secondary | ICD-10-CM

## 2013-09-04 DIAGNOSIS — O09219 Supervision of pregnancy with history of pre-term labor, unspecified trimester: Secondary | ICD-10-CM

## 2013-09-04 DIAGNOSIS — Z331 Pregnant state, incidental: Secondary | ICD-10-CM

## 2013-09-04 DIAGNOSIS — O99340 Other mental disorders complicating pregnancy, unspecified trimester: Secondary | ICD-10-CM

## 2013-09-04 LAB — POCT URINALYSIS DIPSTICK
Blood, UA: NEGATIVE
Glucose, UA: 4
Nitrite, UA: NEGATIVE

## 2013-09-04 NOTE — Addendum Note (Signed)
Addended by: Criss Alvine on: 09/04/2013 04:30 PM   Modules accepted: Orders

## 2013-09-04 NOTE — Progress Notes (Signed)
Sonogram report done and reviewed Fetal macrosomia with poly hydramnios, Doppler flow appropriate with good diastolic flow  Blood sugars: 3 excellent days  2 awful days and 1 ok day No changes, encourged strongly to cover her carbs, even if does not follow diet  BP weight and urine results all reviewed and noted. Patient reports good fetal movement, denies any bleeding and no rupture of membranes symptoms or regular contractions. Patient is without complaints. All questions were answered.

## 2013-09-04 NOTE — Progress Notes (Signed)
U/S(34+5wks)-vtx active fetus, BPP 8/8, upper NL AFI-22.4cm, UA Doppler RI-0.67 & 0.65, female fetus, EFW 7 lb 9 oz (>97th%TILE), anterior Gr 2 placenta

## 2013-09-04 NOTE — Progress Notes (Signed)
Insulin Pump: MN-0400       1.9 0400-0600     2.5 0600-1200     2.25 1200-MN        2.1 Repeat A1C Friday, NST Friday 12/5

## 2013-09-07 ENCOUNTER — Ambulatory Visit (INDEPENDENT_AMBULATORY_CARE_PROVIDER_SITE_OTHER): Payer: PRIVATE HEALTH INSURANCE | Admitting: Obstetrics & Gynecology

## 2013-09-07 ENCOUNTER — Encounter: Payer: Self-pay | Admitting: Obstetrics & Gynecology

## 2013-09-07 VITALS — BP 120/60 | Wt 167.0 lb

## 2013-09-07 DIAGNOSIS — O09299 Supervision of pregnancy with other poor reproductive or obstetric history, unspecified trimester: Secondary | ICD-10-CM

## 2013-09-07 DIAGNOSIS — O0993 Supervision of high risk pregnancy, unspecified, third trimester: Secondary | ICD-10-CM

## 2013-09-07 DIAGNOSIS — O99340 Other mental disorders complicating pregnancy, unspecified trimester: Secondary | ICD-10-CM

## 2013-09-07 DIAGNOSIS — Z1389 Encounter for screening for other disorder: Secondary | ICD-10-CM

## 2013-09-07 DIAGNOSIS — O99019 Anemia complicating pregnancy, unspecified trimester: Secondary | ICD-10-CM

## 2013-09-07 DIAGNOSIS — O24919 Unspecified diabetes mellitus in pregnancy, unspecified trimester: Secondary | ICD-10-CM

## 2013-09-07 DIAGNOSIS — Z331 Pregnant state, incidental: Secondary | ICD-10-CM

## 2013-09-07 DIAGNOSIS — O09219 Supervision of pregnancy with history of pre-term labor, unspecified trimester: Secondary | ICD-10-CM

## 2013-09-07 LAB — POCT URINALYSIS DIPSTICK
Blood, UA: NEGATIVE
Ketones, UA: NEGATIVE

## 2013-09-08 ENCOUNTER — Encounter (HOSPITAL_COMMUNITY): Payer: Self-pay | Admitting: *Deleted

## 2013-09-08 ENCOUNTER — Inpatient Hospital Stay (HOSPITAL_COMMUNITY)
Admission: AD | Admit: 2013-09-08 | Discharge: 2013-09-10 | DRG: 775 | Disposition: A | Discharge status: Home / Self Care | Payer: PRIVATE HEALTH INSURANCE | Source: Ambulatory Visit | Attending: Obstetrics & Gynecology | Admitting: Obstetrics & Gynecology

## 2013-09-08 DIAGNOSIS — O3660X0 Maternal care for excessive fetal growth, unspecified trimester, not applicable or unspecified: Secondary | ICD-10-CM | POA: Diagnosis present

## 2013-09-08 DIAGNOSIS — O99814 Abnormal glucose complicating childbirth: Secondary | ICD-10-CM

## 2013-09-08 DIAGNOSIS — O409XX0 Polyhydramnios, unspecified trimester, not applicable or unspecified: Secondary | ICD-10-CM

## 2013-09-08 DIAGNOSIS — O99334 Smoking (tobacco) complicating childbirth: Secondary | ICD-10-CM | POA: Diagnosis present

## 2013-09-08 DIAGNOSIS — O4290 Premature rupture of membranes, unspecified as to length of time between rupture and onset of labor, unspecified weeks of gestation: Secondary | ICD-10-CM

## 2013-09-08 DIAGNOSIS — Z794 Long term (current) use of insulin: Secondary | ICD-10-CM

## 2013-09-08 DIAGNOSIS — O335XX0 Maternal care for disproportion due to unusually large fetus, not applicable or unspecified: Secondary | ICD-10-CM

## 2013-09-08 LAB — CBC
Hemoglobin: 11.3 g/dL — ABNORMAL LOW (ref 12.0–15.0)
MCV: 85.5 fL (ref 78.0–100.0)
Platelets: 358 10*3/uL (ref 150–400)
RBC: 4.01 MIL/uL (ref 3.87–5.11)
RDW: 13 % (ref 11.5–15.5)
WBC: 11.8 10*3/uL — ABNORMAL HIGH (ref 4.0–10.5)

## 2013-09-08 LAB — TYPE AND SCREEN
ABO/RH(D): O POS
Antibody Screen: NEGATIVE

## 2013-09-08 MED ORDER — IBUPROFEN 600 MG PO TABS
600.0000 mg | ORAL_TABLET | Freq: Four times a day (QID) | ORAL | Status: DC | PRN
  Administered 2013-09-08: 600 mg via ORAL
  Filled 2013-09-08: qty 1

## 2013-09-08 MED ORDER — OXYCODONE-ACETAMINOPHEN 5-325 MG PO TABS
1.0000 | ORAL_TABLET | ORAL | Status: DC | PRN
  Administered 2013-09-08 (×2): 1 via ORAL
  Filled 2013-09-08 (×2): qty 1

## 2013-09-08 MED ORDER — DEXTROSE 50 % IV SOLN: 25.0000 mL | INTRAVENOUS | Status: DC | PRN

## 2013-09-08 MED ORDER — INSULIN REGULAR BOLUS VIA INFUSION
0.0000 [IU] | Freq: Three times a day (TID) | INTRAVENOUS | Status: DC
  Filled 2013-09-08: qty 10

## 2013-09-08 MED ORDER — SODIUM CHLORIDE 0.9 % IV SOLN: 250.0000 mL | INTRAVENOUS | Status: DC | PRN

## 2013-09-08 MED ORDER — FLEET ENEMA 7-19 GM/118ML RE ENEM: 1.0000 | ENEMA | RECTAL | Status: DC | PRN

## 2013-09-08 MED ORDER — BENZOCAINE-MENTHOL 20-0.5 % EX AERO
1.0000 "application " | INHALATION_SPRAY | CUTANEOUS | Status: DC | PRN
  Administered 2013-09-09: 1 via TOPICAL
  Filled 2013-09-08: qty 56

## 2013-09-08 MED ORDER — FLEET ENEMA 7-19 GM/118ML RE ENEM: 1.0000 | ENEMA | Freq: Every day | RECTAL | Status: DC | PRN

## 2013-09-08 MED ORDER — SODIUM CHLORIDE 0.9 % IV SOLN
INTRAVENOUS | Status: DC
  Filled 2013-09-08: qty 1

## 2013-09-08 MED ORDER — EPHEDRINE 5 MG/ML INJ
10.0000 mg | INTRAVENOUS | Status: DC | PRN
  Filled 2013-09-08: qty 2

## 2013-09-08 MED ORDER — DEXTROSE-NACL 5-0.45 % IV SOLN: INTRAVENOUS | Status: DC

## 2013-09-08 MED ORDER — LIDOCAINE HCL (PF) 1 % IJ SOLN
INTRAMUSCULAR | Status: AC
  Filled 2013-09-08: qty 30

## 2013-09-08 MED ORDER — PHENYLEPHRINE 40 MCG/ML (10ML) SYRINGE FOR IV PUSH (FOR BLOOD PRESSURE SUPPORT)
80.0000 ug | PREFILLED_SYRINGE | INTRAVENOUS | Status: DC | PRN
  Filled 2013-09-08: qty 2

## 2013-09-08 MED ORDER — DIPHENHYDRAMINE HCL 25 MG PO CAPS: 25.0000 mg | ORAL_CAPSULE | Freq: Four times a day (QID) | ORAL | Status: DC | PRN

## 2013-09-08 MED ORDER — SODIUM CHLORIDE 0.9 % IJ SOLN: 3.0000 mL | INTRAMUSCULAR | Status: DC | PRN

## 2013-09-08 MED ORDER — OXYCODONE-ACETAMINOPHEN 5-325 MG PO TABS
1.0000 | ORAL_TABLET | ORAL | Status: DC | PRN
  Administered 2013-09-09: 2 via ORAL
  Filled 2013-09-08: qty 2

## 2013-09-08 MED ORDER — ONDANSETRON HCL 4 MG PO TABS: 4.0000 mg | ORAL_TABLET | ORAL | Status: DC | PRN

## 2013-09-08 MED ORDER — FENTANYL 2.5 MCG/ML BUPIVACAINE 1/10 % EPIDURAL INFUSION (WH - ANES): 14.0000 mL/h | INTRAMUSCULAR | Status: DC | PRN

## 2013-09-08 MED ORDER — CITRIC ACID-SODIUM CITRATE 334-500 MG/5ML PO SOLN: 30.0000 mL | ORAL | Status: DC | PRN

## 2013-09-08 MED ORDER — LIDOCAINE HCL (PF) 1 % IJ SOLN
30.0000 mL | INTRAMUSCULAR | Status: DC | PRN
  Filled 2013-09-08: qty 30

## 2013-09-08 MED ORDER — ONDANSETRON HCL 4 MG/2ML IJ SOLN: 4.0000 mg | INTRAMUSCULAR | Status: DC | PRN

## 2013-09-08 MED ORDER — LANOLIN HYDROUS EX OINT: TOPICAL_OINTMENT | CUTANEOUS | Status: DC | PRN

## 2013-09-08 MED ORDER — TETANUS-DIPHTH-ACELL PERTUSSIS 5-2.5-18.5 LF-MCG/0.5 IM SUSP
0.5000 mL | Freq: Once | INTRAMUSCULAR | Status: AC
  Administered 2013-09-10: 0.5 mL via INTRAMUSCULAR

## 2013-09-08 MED ORDER — SIMETHICONE 80 MG PO CHEW: 80.0000 mg | CHEWABLE_TABLET | ORAL | Status: DC | PRN

## 2013-09-08 MED ORDER — PRENATAL MULTIVITAMIN CH
1.0000 | ORAL_TABLET | Freq: Every day | ORAL | Status: DC
  Administered 2013-09-09 – 2013-09-10 (×2): 1 via ORAL
  Filled 2013-09-08 (×2): qty 1

## 2013-09-08 MED ORDER — FENTANYL CITRATE 0.05 MG/ML IJ SOLN
100.0000 ug | INTRAMUSCULAR | Status: DC | PRN
  Administered 2013-09-08: 100 ug via INTRAVENOUS
  Filled 2013-09-08: qty 2

## 2013-09-08 MED ORDER — MEASLES, MUMPS & RUBELLA VAC ~~LOC~~ INJ
0.5000 mL | INJECTION | Freq: Once | SUBCUTANEOUS | Status: AC
  Administered 2013-09-10: 0.5 mL via SUBCUTANEOUS
  Filled 2013-09-08 (×2): qty 0.5

## 2013-09-08 MED ORDER — LACTATED RINGERS IV SOLN: INTRAVENOUS | Status: DC

## 2013-09-08 MED ORDER — ONDANSETRON HCL 4 MG/2ML IJ SOLN: 4.0000 mg | Freq: Four times a day (QID) | INTRAMUSCULAR | Status: DC | PRN

## 2013-09-08 MED ORDER — BISACODYL 10 MG RE SUPP: 10.0000 mg | Freq: Every day | RECTAL | Status: DC | PRN

## 2013-09-08 MED ORDER — OXYTOCIN 40 UNITS IN LACTATED RINGERS INFUSION - SIMPLE MED
INTRAVENOUS | Status: AC
  Filled 2013-09-08: qty 1000

## 2013-09-08 MED ORDER — OXYTOCIN BOLUS FROM INFUSION
500.0000 mL | INTRAVENOUS | Status: DC
  Administered 2013-09-08: 500 mL via INTRAVENOUS

## 2013-09-08 MED ORDER — DIBUCAINE 1 % RE OINT: 1.0000 "application " | TOPICAL_OINTMENT | RECTAL | Status: DC | PRN

## 2013-09-08 MED ORDER — IBUPROFEN 600 MG PO TABS
600.0000 mg | ORAL_TABLET | Freq: Four times a day (QID) | ORAL | Status: DC
  Administered 2013-09-09 – 2013-09-10 (×6): 600 mg via ORAL
  Filled 2013-09-08 (×7): qty 1

## 2013-09-08 MED ORDER — ACETAMINOPHEN 325 MG PO TABS: 650.0000 mg | ORAL_TABLET | ORAL | Status: DC | PRN

## 2013-09-08 MED ORDER — SENNOSIDES-DOCUSATE SODIUM 8.6-50 MG PO TABS
2.0000 | ORAL_TABLET | ORAL | Status: DC
  Administered 2013-09-09 – 2013-09-10 (×2): 2 via ORAL
  Filled 2013-09-08 (×2): qty 2

## 2013-09-08 MED ORDER — OXYTOCIN 40 UNITS IN LACTATED RINGERS INFUSION - SIMPLE MED: 62.5000 mL/h | INTRAVENOUS | Status: DC

## 2013-09-08 MED ORDER — ZOLPIDEM TARTRATE 5 MG PO TABS: 5.0000 mg | ORAL_TABLET | Freq: Every evening | ORAL | Status: DC | PRN

## 2013-09-08 MED ORDER — SODIUM CHLORIDE 0.9 % IJ SOLN: 3.0000 mL | Freq: Two times a day (BID) | INTRAMUSCULAR | Status: DC

## 2013-09-08 MED ORDER — LACTATED RINGERS IV SOLN: 500.0000 mL | INTRAVENOUS | Status: DC | PRN

## 2013-09-08 MED ORDER — OXYTOCIN 40 UNITS IN LACTATED RINGERS INFUSION - SIMPLE MED: 62.5000 mL/h | INTRAVENOUS | Status: DC | PRN

## 2013-09-08 MED ORDER — SODIUM CHLORIDE 0.9 % IV SOLN: INTRAVENOUS | Status: DC

## 2013-09-08 MED ORDER — WITCH HAZEL-GLYCERIN EX PADS: 1.0000 "application " | MEDICATED_PAD | CUTANEOUS | Status: DC | PRN

## 2013-09-08 MED ORDER — LACTATED RINGERS IV SOLN: 500.0000 mL | Freq: Once | INTRAVENOUS | Status: DC

## 2013-09-08 MED ORDER — HYDROXYZINE HCL 50 MG PO TABS
50.0000 mg | ORAL_TABLET | Freq: Four times a day (QID) | ORAL | Status: DC | PRN
  Filled 2013-09-08: qty 1

## 2013-09-08 MED ORDER — DIPHENHYDRAMINE HCL 50 MG/ML IJ SOLN: 12.5000 mg | INTRAMUSCULAR | Status: DC | PRN

## 2013-09-08 NOTE — MAU Note (Signed)
Having ctx on and off all day. Reports some leaking of clear fluid and good fetal movement . Pt is insulin dependant DM with pump.

## 2013-09-08 NOTE — H&P (Signed)
[name] is a 22 y.o. [phone] female at [redacted]w[redacted]d by 10wk u/s, presenting in active preterm labor.  She reports leakage of clear fluid beginning at app 1630, w/ onset of uc's at 1900. Reports good fm, denies vb.  Prenatal care at [facility] since 11wks. Class C IDDM on insulin pump. Initial A1C @ 11wks was 8.8. Normal fetal echo. 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 She has been in 2x/wk antenatal testing since 32wks.  Last u/s 12/2 @ 34.6wks, afi 22.4cm, efw 3429g >97%, bpp 8/8, dopplers .67&.65. H/O 35wk del ago after IOL d/t severe pre-e. Infant weighed 7lb 2.5oz, had 45s SD w/ clavicle fx.   History OB History   Grav Para Term Preterm Abortions TAB SAB Ect Mult Living   3 1 0 1 1 0 0 1 0 1      Past Medical History  Diagnosis Date  . Corneal abrasion   . Candidiasis   . Tubal pregnancy   . Urinary tract infection   . Bipolar disorder   . Pregnancy   . Pregnancy induced hypertension 2013  . Preterm labor 08/02/13    admitted to Antenatal 08/02/13  . 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 Diabetes mellitus     dx age 82  . Diabetes mellitus type I    Past Surgical History  Procedure Laterality Date  . Wisdom tooth extraction  2012   Family History: family history includes Cancer in her maternal grandfather and maternal grandmother; Hypertension in her maternal grandmother. There is no history of Anesthesia problems. Social History:  reports that she has been smoking Cigarettes.  She has a .5 pack-year smoking history. She has never used smokeless tobacco. She reports that she does not drink alcohol or use illicit drugs.   Review of Systems  Constitutional: Negative.   HENT: Negative.   Eyes: Negative.   Respiratory: Negative.   Cardiovascular: Negative.   Gastrointestinal: Positive for abdominal pain (uc's).   Genitourinary: Negative.   Musculoskeletal: Negative.   Skin: Negative.   Neurological: Negative.   Endo/Heme/Allergies: Negative.   Psychiatric/Behavioral: Negative.       There were no vitals taken for this visit. Maternal Exam:  Uterine Assessment: Contraction strength is firm.  Contraction frequency is regular.   Abdomen: Fetal presentation: vertex  Introitus: Normal vulva. Normal vagina.  Ferning test: positive.  Amniotic fluid character: clear.  Pelvis: adequate for delivery.   Cervix: Cervix evaluated by digital exam.     Fetal Exam Fetal Monitor Review: Mode: ultrasound.   Baseline rate: 140.  Variability: moderate (6-25 bpm).   Pattern: no decelerations and no accelerations.    Fetal State Assessment: Category I - tracings are normal.     Physical Exam  Constitutional: She is oriented to person, place, and time. She appears well-developed and well-nourished.  HENT:  Head: Normocephalic.  Neck: Normal range of motion.  Cardiovascular: Normal rate and regular rhythm.   Respiratory: Effort normal and breath sounds normal.  GI: Soft. There is no tenderness.  gravid  Genitourinary:  SVE: 6/90/0, vtx + LOF, Fern +  Musculoskeletal: Normal range of motion.  Neurological: She is alert and oriented to person, place, and time. She has normal reflexes.  Skin: Skin is warm and dry.  Psychiatric: She has a normal mood and affect. Her  behavior is normal. Judgment and thought content normal.    Prenatal labs: ABO, Rh: O/POS/-- (06/23 1250) Antibody: NEG (10/17 1115) Rubella: 0.32 (06/23 1250) RPR: NON REAC (10/17 1115)  HBsAg: NEGATIVE (06/23 1250)  HIV: NON REACTIVE (10/17 1115)  GBS:  neg  NT/IT/AFP: declined  Assessment/Plan: A:   [redacted]w[redacted]d SIUP  [id number]   Active PTL/SROM  Cat I FHR  GBS neg  Class C IDDM, insulin pump, EFW >97%  Poss Lt fetal renal abnormality  H/O shoulder dystocia  P:  Admit to BS  IV pain med/epidural prn  Expectant  management  Turn insulin pump off and begin glucostabilizer  Anticipate NSVD   Marge Duncans 09/08/13 @ 2043

## 2013-09-09 ENCOUNTER — Encounter (HOSPITAL_COMMUNITY): Payer: Self-pay

## 2013-09-09 LAB — GLUCOSE, CAPILLARY
Glucose-Capillary: 105 mg/dL — ABNORMAL HIGH (ref 70–99)
Glucose-Capillary: 103 mg/dL — ABNORMAL HIGH (ref 70–99)
Glucose-Capillary: 186 mg/dL — ABNORMAL HIGH (ref 70–99)
Glucose-Capillary: 84 mg/dL (ref 70–99)
Glucose-Capillary: 201 mg/dL — ABNORMAL HIGH (ref 70–99)

## 2013-09-09 LAB — RPR: RPR Ser Ql: NONREACTIVE

## 2013-09-09 NOTE — Progress Notes (Signed)
I was called to patient's room emergently for a shoulder dystocia.  I arrived according to everyone's estimation at about 1 1/2 minutes into the dystocia.    Pt was uncooperative due to pain and was dificult to manage.  I had the patient's legs hyperflexed  I performed a wood's screw maneuver by placing my fingers posterior to the right shoulder and rotating counter clockwise.  The baby rotated but did not deliver.  I then delivered the posterior arm which of course was still the right arm.  The baby then delivered easily after that.  I did not at any time apply traction to the head.  Total time of dystocia was approximately 3 minutes.  The baby went to the NICU due to some breathing issues.

## 2013-09-09 NOTE — Progress Notes (Signed)
Called to room by patient and she stated "I feel jittery".  CBG check 85.  Patient ate graham crackers and peanut butter.  Re-check at 1635, CBG 113.  Patient reports feeling better.  Will continue to monitor.  Osvaldo Angst, RN---------------------

## 2013-09-09 NOTE — Plan of Care (Signed)
Problem: Phase I Progression Outcomes Goal: OOB as tolerated unless otherwise ordered Outcome: Completed/Met Date Met:  09/09/13 Tolerated first ambulation well Goal: VS, stable, temp < 100.4 degrees F Outcome: Completed/Met Date Met:  09/09/13 Bp slightly elevated Goal: Initial discharge plan identified Outcome: Completed/Met Date Met:  09/09/13 Glucose stable Bleeding WNL Vital signs stable Pain controlled Understands self care Knows what to call the MD for,and when F/U is    Goal: Other Phase I Outcomes/Goals Outcome: Progressing GBG stable for D/C

## 2013-09-09 NOTE — Progress Notes (Signed)
Clinical Social Work Department PSYCHOSOCIAL ASSESSMENT - MATERNAL/CHILD 09/09/2013  Patient:  [name]  Account Number:  [id number]  Admit Date:  09/08/2013  Childs Name:   [name]    Clinical Social Worker:  Paysen Goza, LCSW   Date/Time:  09/09/2013 02:15 PM  Date Referred:  09/09/2013   Referral source  NICU     Referred reason  NICU   Other referral source:    I:  FAMILY / HOME ENVIRONMENT Child's legal guardian:  PARENT  Guardian - Name Guardian - Age Guardian - Address  Fox,Patricia [address]  [place], [place] [address]  Fox, Patricia 19 same as above   Other household support members/support persons Other support:    II  PSYCHOSOCIAL DATA Information Source:    Financial and Community Resources Employment:   FOB is employed   Financial resources:  Medicaid If Medicaid - County:   Other  WIC   School / Grade:   Maternity Care Coordinator / Child Services Coordination / Early Interventions:  Cultural issues impacting care:    III  STRENGTHS  Strength comment:    IV  RISK FACTORS AND CURRENT PROBLEMS Current Problem:       V  SOCIAL WORK ASSESSMENT Met with mother who was pleasant and receptive to social work intervention.  FOB and maternal grandmother were also present.  Parents are not married.   Mother states that she is legally separated and hopes to be divorced by 11/2012. She and FOB have one other dependent age 1.   FOB is employed and very supportive.  The family is currently residing with maternal grandmother.  Mother states that she was diagnosed with bipolar at age 20 and prescribed medication, which she never took because she found out she was pregnant at the time.  She denies any hx of psychiatric hospitalization and states that she has maintained stability and questions the diagnosis.  She denies any current symptoms of depression or anxiety.  She also denies any hx of substance abuse.   Spoke with her regarding PPD and  provided her with information and resources.   Both parents seems to be coping well with newborn NICU admission.  Informed that they have spoken with the medical team about newborn's condition  No acute social concerns related at this time.      VI SOCIAL WORK PLAN Social Work Plan  Psychosocial Support/Ongoing Assessment of Needs   Correna Meacham J, LCSW  

## 2013-09-09 NOTE — Lactation Note (Signed)
This note was copied from the chart of [name]. Lactation Consultation Note  Patient Name: [name] [id number] Date: 09/09/2013 Reason for consult: Initial assessment;Other (Comment) (charting for exclusion)   Maternal Data Formula Feeding for Exclusion: Yes Reason for exclusion: Mother's choice to formula feed on admision;Admission to Intensive Care Unit (ICU) post-partum  Feeding Feeding Type: Formula Length of feed: 30 min  LATCH Score/Interventions                      Lactation Tools Discussed/Used     Consult Status Consult Status: Complete    Lynda Rainwater 09/09/2013, 4:47 PM

## 2013-09-09 NOTE — Plan of Care (Signed)
Problem: Phase II Progression Outcomes Goal: Pain controlled on oral analgesia Outcome: Completed/Met Date Met:  09/09/13 Good pain control on po Percocet and Motrin. Goal: Progress activity as tolerated unless otherwise ordered Outcome: Completed/Met Date Met:  09/09/13 Tolerating walking in room well.

## 2013-09-09 NOTE — Progress Notes (Signed)
CBG recheck is 144.Her insulin pump does not instruct her to give herself a bolus for this CBG result.

## 2013-09-09 NOTE — Progress Notes (Signed)
Post Partum Day 1 Subjective: no complaints, up ad lib and voiding  Objective: Blood pressure 118/72, pulse 87, temperature 98.4 F (36.9 C), temperature source Oral, resp. rate 15, height 5\' 5"  (1.651 m), weight 75.751 kg (167 lb), SpO2 98.00%, unknown if currently breastfeeding.  Physical Exam:  General: alert, cooperative and no distress Lochia: appropriate Uterine Fundus: firm Incision: na DVT Evaluation: No evidence of DVT seen on physical exam.   Recent Labs  09/08/13 2045  HGB 11.3*  HCT 34.3*   BS 104 this am(decreased pump settings by 33%) Assessment/Plan: Bottle feeding Nexplanon  Discharge tomorrow Baby in NICU needing supplemental oxygen and antibiotics   LOS: 1 day   Kenny Stern H 09/09/2013, 7:39 AM

## 2013-09-09 NOTE — Progress Notes (Addendum)
Patient ate her dinner late and consumed 31 carbs.Patients basil setting for time span of 12 noon to 12 am is currently 1.4 units her CBG  Was 201,her insulin pump wants her to give a 3.9 unit bolus.

## 2013-09-10 LAB — GLUCOSE, CAPILLARY
Glucose-Capillary: 123 mg/dL — ABNORMAL HIGH (ref 70–99)
Glucose-Capillary: 73 mg/dL (ref 70–99)

## 2013-09-10 MED ORDER — PNEUMOCOCCAL VAC POLYVALENT 25 MCG/0.5ML IJ INJ: 0.5000 mL | INJECTION | INTRAMUSCULAR | Status: DC

## 2013-09-10 MED ORDER — PNEUMOCOCCAL VAC POLYVALENT 25 MCG/0.5ML IJ INJ
0.5000 mL | INJECTION | Freq: Once | INTRAMUSCULAR | Status: AC
  Administered 2013-09-10: 0.5 mL via INTRAMUSCULAR
  Filled 2013-09-10: qty 0.5

## 2013-09-10 NOTE — Progress Notes (Signed)
Hypoglycemic Event  CBG: 57  Treatment: 4 graham cracker squares and peanut butter packet  Symptoms: None  Follow-up CBG: Time:0915          CBG Result:  123  Possible Reasons for Event: Unknown  Comments/MD notified: Dr. Ike Bene in room, no orders received    Windle Guard, Richardson Dopp  Remember to initiate Hypoglycemia Order Set & complete

## 2013-09-10 NOTE — Progress Notes (Signed)
Post Partum Day 2 Subjective:  Today [name] states she is feeling much better than yesterday.  She reports some vaginal discomfort while urinating which is easily relieved with irrigation.  She states she has not had a BM yet, however has a good appetite.  She denies chest pain, SOB.  She wishes to use nexplanon for [facility] and plans on bottle feeding her baby boy.  Objective: Blood pressure 111/73, pulse 91, temperature 98.4 F (36.9 C), temperature source Oral, resp. rate 15, height 5\' 5"  (1.651 m), weight 75.751 kg (167 lb), SpO2 97.00%, unknown if currently breastfeeding.  FSG  Physical Exam:  General: alert, cooperative and no distress Lochia: appropriate Uterine Fundus: firm DVT Evaluation: No evidence of DVT seen on physical exam. Negative Homan's sign. No cords or calf tenderness.  FSG: 51-204   Recent Labs  09/08/13 2045  HGB 11.3*  HCT 34.3*    Assessment/Plan: Class C DM - Monitor CBG Pre/post prandial for appropriate insulin pump adjustment  Meeting other PP milestones and able to be discharged after care planned with diabetic adjustment. Infant to stay in NICU   LOS: 2 days   Loma Newton 09/10/2013, 9:06 AM   I spoke with and examined patient and agree with PA-S's note and plan of care.  Tawana Scale, MD Ob Fellow 09/10/2013 10:05 AM

## 2013-09-10 NOTE — Lactation Note (Signed)
This note was copied from the chart of [name]. Lactation Consultation Note    Initial consult with this mom of a NICU baby, now 37 hours post partum, and baby 35 5/7 weeks corrected gestation. Mom was not going to provide EBm dur to the fact the she is a cigarette smoker. I told her it would be better if she did not smoke, but the benefits fo EBM  outweigh the risks of the smoking.  I reviewed with mom lactation services, the standard setting on the DEP, and hand expression. Mom has very small nipple and areolas, so I decreased her to 21 flanges, with a better fit. Mom is active with WIC in [facility]. i advised her to call for a DEP. i also gave mom a manual hand pump, and instructed her in tit"s use. Mom was able to express a few drops fo colostrum. Mom knows to call for questions/concerns.  Patient Name: Patricia Tulip Meharg [id number] Date: 09/10/2013 Reason for consult: Initial assessment   Maternal Data    Feeding Feeding Type: Formula Length of feed: 30 min  LATCH Score/Interventions                      Lactation Tools Discussed/Used Tools: Pump Breast pump type: Double-Electric Breast Pump WIC Program: Yes [employer] county) Pump Review: Setup, frequency, and cleaning;Milk Storage;Other (comment) (teaching on how to provide EBM done with mom) Initiated by:: bbedside nurse Date initiated:: 09/10/13   Consult Status Consult Status: Follow-up Follow-up type:  (prn in NICU)    Alfred Levins 09/10/2013, 2:24 PM

## 2013-09-10 NOTE — Progress Notes (Signed)
Ur chart review completed.  

## 2013-09-10 NOTE — Discharge Summary (Addendum)
Obstetric Discharge Summary Reason for Admission: onset of labor and rupture of membranes Prenatal Procedures: ultrasound Intrapartum Procedures: spontaneous vaginal delivery and rotation due to SD x3 min Postpartum Procedures: none Complications-Operative and Postpartum: none Hemoglobin  Date Value Range Status  09/08/2013 11.3* 12.0 - 15.0 g/dL Final     HCT  Date Value Range Status  09/08/2013 34.3* 36.0 - 46.0 % Final   Hospital Stay: Pt stay uncomplicated after NSVD with shoulder dystocia. Pt pregnancy has been complicated with DM-C and has been attempting to control her sugars with insulin pump. Pt was seen by [name] who had established care with pt in clinic and was able to see today to adjust insulin pump.  Insulin pump adjusted to  Basal MN - 1.2  4am -1.8-->1.3  6am -1.5  12pm- 1.4  Sensitivity (correction) 1U/35  CHO Ratio 1unit/9gm   Pt otherwise met milestones and is doing well. Infant has been in NICU for sugars and respiratory status. Pt will establish care in [place] with a practice familiar with insulin pumps. Pt discharged home with adequate supply to follow up. Will also follow up in [facility] in next 2 weeks for evaluation.   Physical Exam:  General: alert, cooperative and no distress Lochia: appropriate Uterine Fundus: firm DVT Evaluation: No evidence of DVT seen on physical exam. Negative Homan's sign. No cords or calf tenderness. No significant calf/ankle edema.  Discharge Diagnoses: Premature labor and delivered  Discharge Information: Date: 09/10/2013 Activity: unrestricted Diet: routine Medications: None Condition: stable Instructions: refer to practice specific booklet Discharge to: home Follow-up Information   Follow up with [name].   Specialty:  Obstetrics and Gynecology   Contact information:   [name] [address] [phone]      Newborn Data: Live born female  Birth Weight: 9 lb 1 oz (4111  g) APGAR: 3, 6  In NICU due to feeding/breathing issues and abx.    [name] 09/10/2013, 4:54 PM

## 2013-09-10 NOTE — Progress Notes (Addendum)
CBG at 8am was 57. Pt given a snack and CBG was 123 at 9:15am.  Pt did not eat breakfast. She ate lunch at 12:40pm and CBG at 14:40pm was 73.  Pt ate a snack at 16:00 and then was discharged home at 18:20pm before she ate supper.  Pt discharged to home with significant other.  Condition stable.  Pt with plan to see provider at [facility] tomorrow for insulin pump follow-up.  Will call [facility] tomorrow for post-partum visit in 4-6 weeks.  Plans to get breast-pump from [facility] office tomorrow. Pt ambulated to NICU with plans to leave hospital from there.  No equipment for home ordered at discharge.

## 2013-09-10 NOTE — Progress Notes (Addendum)
Pt was seen by Nancie Neas who had established care with pt in clinic and was able to see today to adjust insulin pump.   Insulin pump adjusted to  Basal MN - 1.2 4am -1.8-->1.3 6am -1.5 12pm- 1.4 Sensitivity (correction) 1U/35 CHO Ratio 1unit/9gm  Tawana Scale, MD OB Fellow

## 2013-09-11 ENCOUNTER — Encounter: Payer: PRIVATE HEALTH INSURANCE | Admitting: Obstetrics & Gynecology

## 2013-09-11 ENCOUNTER — Other Ambulatory Visit: Payer: PRIVATE HEALTH INSURANCE

## 2013-09-21 ENCOUNTER — Encounter: Payer: Self-pay | Admitting: Obstetrics & Gynecology

## 2013-09-24 ENCOUNTER — Ambulatory Visit: Payer: Self-pay

## 2013-09-24 ENCOUNTER — Encounter: Payer: PRIVATE HEALTH INSURANCE | Admitting: Obstetrics and Gynecology

## 2013-09-24 NOTE — Lactation Note (Signed)
This note was copied from the chart of Patricia Lessly Stigler. Lactation Consultation Note     Follow up consult with this mom and baby, in the NICU. I was asked to come and assist mom with breast feeding.  In talking to mom, she has not been pumpng more than 2-5 times a day, and is now 2 weeks post partum. She is expressing about 20-30 mls when she pumps. Mom told me she does not really want to put the baby to breast, and just wanted to provide some  Colostrum for the baby.  Because of what mom said, we decided to not latch the baby, and mom then fed baby with bottle.   Patient Name: [name]'U Date: 09/24/2013 Reason for consult: Follow-up assessment;NICU baby   Maternal Data    Feeding Feeding Type: Breast Milk Nipple Type: Other (dr brown preemie) Length of feed: 50 min (15"PO/ 35"NG)  LATCH Score/Interventions                      Lactation Tools Discussed/Used     Consult Status Consult Status: Complete Follow-up type: Call as needed    Alfred Levins 09/24/2013, 2:12 PM

## 2013-10-04 ENCOUNTER — Encounter (HOSPITAL_COMMUNITY): Payer: Self-pay | Admitting: Emergency Medicine

## 2013-10-04 DIAGNOSIS — Z8659 Personal history of other mental and behavioral disorders: Secondary | ICD-10-CM | POA: Insufficient documentation

## 2013-10-04 DIAGNOSIS — Z3202 Encounter for pregnancy test, result negative: Secondary | ICD-10-CM | POA: Insufficient documentation

## 2013-10-04 DIAGNOSIS — N390 Urinary tract infection, site not specified: Secondary | ICD-10-CM | POA: Insufficient documentation

## 2013-10-04 DIAGNOSIS — Z794 Long term (current) use of insulin: Secondary | ICD-10-CM | POA: Insufficient documentation

## 2013-10-04 DIAGNOSIS — E109 Type 1 diabetes mellitus without complications: Secondary | ICD-10-CM | POA: Insufficient documentation

## 2013-10-04 DIAGNOSIS — F17200 Nicotine dependence, unspecified, uncomplicated: Secondary | ICD-10-CM | POA: Insufficient documentation

## 2013-10-04 DIAGNOSIS — Z8619 Personal history of other infectious and parasitic diseases: Secondary | ICD-10-CM | POA: Insufficient documentation

## 2013-10-04 NOTE — ED Notes (Signed)
Patient states that she had a baby on December 6 th and has not stopped bleeding since having the baby.

## 2013-10-05 ENCOUNTER — Emergency Department (HOSPITAL_COMMUNITY)
Admission: EM | Admit: 2013-10-05 | Discharge: 2013-10-05 | Disposition: A | Discharge status: Home / Self Care | Payer: PRIVATE HEALTH INSURANCE | Attending: Emergency Medicine | Admitting: Emergency Medicine

## 2013-10-05 DIAGNOSIS — R109 Unspecified abdominal pain: Secondary | ICD-10-CM

## 2013-10-05 DIAGNOSIS — N390 Urinary tract infection, site not specified: Secondary | ICD-10-CM

## 2013-10-05 LAB — URINALYSIS, ROUTINE W REFLEX MICROSCOPIC
BILIRUBIN URINE: NEGATIVE
GLUCOSE, UA: NEGATIVE mg/dL
Nitrite: NEGATIVE
PH: 7 (ref 5.0–8.0)
Specific Gravity, Urine: 1.025 (ref 1.005–1.030)
Urobilinogen, UA: 0.2 mg/dL (ref 0.0–1.0)

## 2013-10-05 LAB — URINE MICROSCOPIC-ADD ON

## 2013-10-05 LAB — PREGNANCY, URINE: Preg Test, Ur: NEGATIVE

## 2013-10-05 MED ORDER — OXYCODONE-ACETAMINOPHEN 5-325 MG PO TABS
2.0000 | ORAL_TABLET | Freq: Once | ORAL | Status: AC
Start: 2013-10-05 — End: 2013-10-05
  Administered 2013-10-05: 2 via ORAL

## 2013-10-05 MED ORDER — IBUPROFEN 800 MG PO TABS
800.0000 mg | ORAL_TABLET | Freq: Once | ORAL | Status: AC
  Administered 2013-10-05: 800 mg via ORAL
  Filled 2013-10-05: qty 1

## 2013-10-05 MED ORDER — OXYCODONE-ACETAMINOPHEN 5-325 MG PO TABS
ORAL_TABLET | ORAL | Status: AC
  Filled 2013-10-05: qty 2

## 2013-10-05 MED ORDER — CEPHALEXIN 500 MG PO CAPS: 500.0000 mg | ORAL_CAPSULE | Freq: Four times a day (QID) | ORAL | Status: DC

## 2013-10-05 MED ORDER — CEPHALEXIN 500 MG PO CAPS
ORAL_CAPSULE | ORAL | Status: AC
  Filled 2013-10-05: qty 1

## 2013-10-05 MED ORDER — CEPHALEXIN 500 MG PO CAPS
500.0000 mg | ORAL_CAPSULE | Freq: Once | ORAL | Status: AC
  Administered 2013-10-05: 500 mg via ORAL

## 2013-10-05 NOTE — Discharge Instructions (Signed)
Please call your doctor for a followup appointment within 24-48 hours. When you talk to your doctor please let them know that you were seen in the emergency department and have them acquire all of your records so that they can discuss the findings with you and formulate a treatment plan to fully care for your new and ongoing problems. ° °

## 2013-10-05 NOTE — ED Provider Notes (Signed)
CSN: [id number]     Arrival date & time 10/04/13  2050 History   First MD Initiated Contact with Patient 10/05/13 0026     Chief Complaint  Patient presents with  . Abdominal Pain   (Consider location/radiation/quality/duration/timing/severity/associated sxs/prior Treatment) HPI Comments: 23 year old female with a history of urinary tract infection, recent vaginal delivery one month ago and type 1 diabetes onset at age 23. She is currently using an insulin pump and doing well stating that her blood sugars have been between 102 100. Her vaginal bleeding has gradually decreased and now she has only intermittent spotting.  She complains of abdominal pain which is described as cramping, located in the left lower and left upper quadrant, is rather persistent throughout the day, not associated with urination and has no dysuria, diarrhea, rectal bleeding, constipation, hematuria or frequency of urination. She denies swelling, fever, chills, nausea, vomiting. She has never had abdominal surgery.  Patient is a 23 y.o. female presenting with abdominal pain. The history is provided by the patient.  Abdominal Pain   Past Medical History  Diagnosis Date  . Corneal abrasion   . Candidiasis   . Tubal pregnancy   . Urinary tract infection   . Bipolar disorder   . Pregnancy   . Pregnancy induced hypertension 2013  . Preterm labor 08/02/13    admitted to Antenatal 08/02/13  . 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 Diabetes mellitus     dx age 23  . Diabetes mellitus type I    Past Surgical History  Procedure Laterality Date  . Wisdom tooth extraction  2012   Family History  Problem Relation Age of Onset  . Anesthesia problems Neg Hx   . Cancer Maternal Grandmother     breast  . Hypertension Maternal Grandmother   . Cancer Maternal Grandfather     throat and lung   History  Substance Use Topics  . Smoking status: Current Some Day Smoker -- 0.25 packs/day for 2 years   Types: Cigarettes  . Smokeless tobacco: Never Used     Comment: quit with + preg  . Alcohol Use: No   OB History   Grav Para Term Preterm Abortions TAB SAB Ect Mult Living   3 2 0 2 1 0 0 1 0 2      Review of Systems  Gastrointestinal: Positive for abdominal pain.  All other systems reviewed and are negative.    Allergies  Review of patient's allergies indicates no known allergies.  Home Medications   Current Outpatient Rx  Name  Route  Sig  Dispense  Refill  . Insulin Human (INSULIN PUMP) 100 unit/ml SOLN   Subcutaneous   Inject 1 each into the skin 3 times daily with meals, bedtime and 2 AM. Novolog   1 each   11   . cephALEXin (KEFLEX) 500 MG capsule   Oral   Take 1 capsule (500 mg total) by mouth 4 (four) times daily.   40 capsule   0    BP 121/85  Pulse 93  Temp(Src) 98.3 F (36.8 C) (Oral)  Resp 18  Ht 5\' 5"  (1.651 m)  Wt 167 lb (75.751 kg)  BMI 27.79 kg/m2  SpO2 100%  Breastfeeding? No Physical Exam  Nursing note and vitals reviewed. Constitutional: She appears well-developed and well-nourished.  HENT:  Head: Normocephalic and atraumatic.  Mouth/Throat: Oropharynx is clear and moist. No oropharyngeal exudate.  Eyes: Conjunctivae and EOM are normal. Pupils are  equal, round, and reactive to light. Right eye exhibits no discharge. Left eye exhibits no discharge. No scleral icterus.  Neck: Normal range of motion. Neck supple. No JVD present. No thyromegaly present.  Cardiovascular: Normal rate, regular rhythm, normal heart sounds and intact distal pulses.  Exam reveals no gallop and no friction rub.   No murmur heard. Pulmonary/Chest: Effort normal and breath sounds normal. No respiratory distress. She has no wheezes. She has no rales.  Abdominal: Soft. Bowel sounds are normal. She exhibits no distension and no mass. There is tenderness ( Focal tenderness to the left lower quadrant and suprapubic area, no other tenderness, very soft, no peritoneal signs,  normal bowel sounds).  Mild left CVA tenderness  Musculoskeletal: Normal range of motion. She exhibits no edema and no tenderness.  Lymphadenopathy:    She has no cervical adenopathy.  Neurological: She is alert. Coordination normal.  Skin: Skin is warm and dry. No rash noted. No erythema.  Psychiatric: She has a normal mood and affect. Her behavior is normal.    ED Course  Procedures (including critical care time) Labs Review Labs Reviewed  URINALYSIS, ROUTINE W REFLEX MICROSCOPIC - Abnormal; Notable for the following:    APPearance HAZY (*)    Hgb urine dipstick SMALL (*)    Ketones, ur TRACE (*)    Protein, ur TRACE (*)    Leukocytes, UA SMALL (*)    All other components within normal limits  URINE MICROSCOPIC-ADD ON - Abnormal; Notable for the following:    Squamous Epithelial / LPF MANY (*)    Bacteria, UA MANY (*)    All other components within normal limits  URINE CULTURE  PREGNANCY, URINE   Imaging Review No results found.  EKG Interpretation   None       MDM   1. UTI (lower urinary tract infection)   2. Abdominal pain    The patient appears nontoxic, afebrile, normal pulse and blood pressure. Her abdomen is tender as is her flank consistent with a possible urinary infection. The urinalysis is pending but the urine sample does appear cloudy. If urine sample shows no signs of infection would consider other sources such as diverticulitis. The pain is reproducible, doubt kidney stone. She has no vaginal discharge and scant vaginal spotting, retained products of conception one month out would be unlikely especially given that she is afebrile and has minimal suprapubic tenderness. She also states that she has not been sexually active since having her baby 4 weeks ago.  Urinalysis is consistent with urinary tract infection. She will be treated for urinary infection, possibly pyelonephritis with Keflex. She has no systemic inflammatory response criteria, is tolerating  oral medications and appears well. This was explained to the patient, she is aware of her results, she will followup with her family doctor.  She states that she is no longer breast-feeding   Meds given in ED:  Medications  ibuprofen (ADVIL,MOTRIN) tablet 800 mg (800 mg Oral Given 10/05/13 0037)  cephALEXin (KEFLEX) capsule 500 mg (500 mg Oral Given 10/05/13 0154)  oxyCODONE-acetaminophen (PERCOCET/ROXICET) 5-325 MG per tablet 2 tablet (2 tablets Oral Given 10/05/13 0154)    Discharge Medication List as of 10/05/2013  1:52 AM    START taking these medications   Details  cephALEXin (KEFLEX) 500 MG capsule Take 1 capsule (500 mg total) by mouth 4 (four) times daily., Starting 10/05/2013, Until Discontinued, Print          Vida Roller, MD 10/05/13 [id number]

## 2013-10-05 NOTE — ED Notes (Signed)
Pt alert & oriented x4, stable gait. Patient given discharge instructions, paperwork & prescription(s). Patient  instructed to stop at the registration desk to finish any additional paperwork. Patient verbalized understanding. Pt left department w/ no further questions. 

## 2013-10-06 LAB — URINE CULTURE

## 2013-10-18 ENCOUNTER — Ambulatory Visit (INDEPENDENT_AMBULATORY_CARE_PROVIDER_SITE_OTHER): Payer: PRIVATE HEALTH INSURANCE | Admitting: Obstetrics & Gynecology

## 2013-10-18 ENCOUNTER — Encounter: Payer: Self-pay | Admitting: Obstetrics & Gynecology

## 2013-10-18 NOTE — Progress Notes (Signed)
Patient ID: Patricia DuttonKristine Fox, female   DOB: 1991-05-17, 23 y.o.   MRN: [id number] Post partum from NSVD 09/08/2014 with severe shoulder dystocia, 9lb 4oz  Exam Normal post partum exam with normal involution Perineum and vagina well healed  Wants nexplanon Follow up in 2 weeks for that

## 2013-10-19 ENCOUNTER — Ambulatory Visit (INDEPENDENT_AMBULATORY_CARE_PROVIDER_SITE_OTHER): Payer: PRIVATE HEALTH INSURANCE | Admitting: General Practice

## 2013-10-19 ENCOUNTER — Encounter: Payer: Self-pay | Admitting: General Practice

## 2013-10-19 VITALS — BP 105/59 | HR 90 | Temp 97.3°F | Ht 65.0 in | Wt 133.0 lb

## 2013-10-19 DIAGNOSIS — R739 Hyperglycemia, unspecified: Secondary | ICD-10-CM

## 2013-10-19 DIAGNOSIS — R7309 Other abnormal glucose: Secondary | ICD-10-CM

## 2013-10-19 DIAGNOSIS — E162 Hypoglycemia, unspecified: Secondary | ICD-10-CM

## 2013-10-19 DIAGNOSIS — E109 Type 1 diabetes mellitus without complications: Secondary | ICD-10-CM

## 2013-10-19 LAB — GLUCOSE, POCT (MANUAL RESULT ENTRY)
POC Glucose: 411 mg/dl — AB (ref 70–99)
POC Glucose: 386 mg/dl — AB (ref 70–99)

## 2013-10-19 LAB — POCT GLYCOSYLATED HEMOGLOBIN (HGB A1C): Hemoglobin A1C: 8.1

## 2013-10-19 NOTE — Progress Notes (Signed)
   Subjective:    Patient ID: Patricia DuttonKristine [name], female    DOB: [birth year], 23 y.o.   MRN: [id number]  HPI Patient presents with complaints of low blood sugars every day last week. Reports blood sugars were 20's and 30's in am. Reports using an insulin pump (novolog) and decreased basal setting to .65 units (12a-6am)/ 1 unit (6am-12am). Blood sugars readings after increase have been 200-210. She is unsure exactly what her previous settings were prior to decrease on Sunday. She thinks her basal rate was 1.5 units. Reports feeling "foggy" during mornings blood sugars were low.  Reports insulin pump was being managed by OB until after baby was born (December 2014) and then [place] up with [place] in [place], unsure of providers name.     Review of Systems  Constitutional: Negative for fever and chills.  Respiratory: Negative for chest tightness and shortness of breath.   Cardiovascular: Negative for chest pain and palpitations.  All other systems reviewed and are negative.       Objective:   Physical Exam  Constitutional: She is oriented to person, place, and time. She appears well-developed and well-nourished.  HENT:  Head: Normocephalic and atraumatic.  Right Ear: External ear normal.  Left Ear: External ear normal.  Nose: Nose normal.  Mouth/Throat: Oropharynx is clear and moist.  Eyes: EOM are normal. Pupils are equal, round, and reactive to light.  Neck: Normal range of motion. Neck supple. No thyromegaly present.  Cardiovascular: Normal rate, regular rhythm and normal heart sounds.   Pulmonary/Chest: Effort normal and breath sounds normal. No respiratory distress. She exhibits no tenderness.  Abdominal: Soft. Bowel sounds are normal. She exhibits no distension. There is no tenderness.  Musculoskeletal: She exhibits no edema and no tenderness.  Lymphadenopathy:    She has no cervical adenopathy.  Neurological: She is alert and oriented to person, place, and time.  Skin: Skin  is warm and dry.  Psychiatric: She has a normal mood and affect.     . Results for orders placed in visit on 10/19/13  GLUCOSE, POCT (MANUAL RESULT ENTRY)      Result Value Range   POC Glucose 411 (*) 70 - 99 mg/dl  POCT GLYCOSYLATED HEMOGLOBIN (HGB A1C)      Result Value Range   Hemoglobin A1C 8.1          Assessment & Plan:  1. Low blood sugar  - POCT glucose (manual entry)  2. Diabetes mellitus type 1  - POCT glycosylated hemoglobin (Hb A1C)  3. Elevated blood sugar -Patient's blood glucose 411 and she administered 10 units of novolog and will recheck in 15 minutes -Blood sugar trending down, currently 387, patient to continue monitoring blood sugars and call office if not less than 300 -discussed healthy eating, importance of monitoring blood glucose, regular exercise -RTO for follow up appointment with Tammy Continue all current medications  Patient verbalized understanding Coralie KeensMae E. Bracha Frankowski, FNP-C

## 2013-10-19 NOTE — Patient Instructions (Signed)

## 2013-10-27 ENCOUNTER — Other Ambulatory Visit: Payer: Self-pay | Admitting: Obstetrics and Gynecology

## 2013-10-29 ENCOUNTER — Ambulatory Visit: Payer: PRIVATE HEALTH INSURANCE

## 2013-11-01 ENCOUNTER — Encounter: Payer: PRIVATE HEALTH INSURANCE | Admitting: Adult Health

## 2013-11-01 ENCOUNTER — Other Ambulatory Visit: Payer: PRIVATE HEALTH INSURANCE

## 2013-11-05 ENCOUNTER — Encounter: Payer: Self-pay | Admitting: Pharmacist

## 2013-11-05 ENCOUNTER — Other Ambulatory Visit: Payer: PRIVATE HEALTH INSURANCE

## 2013-11-05 ENCOUNTER — Ambulatory Visit (INDEPENDENT_AMBULATORY_CARE_PROVIDER_SITE_OTHER): Payer: PRIVATE HEALTH INSURANCE | Admitting: Pharmacist

## 2013-11-05 VITALS — BP 98/62 | HR 80 | Ht 65.0 in | Wt 130.0 lb

## 2013-11-05 DIAGNOSIS — IMO0002 Reserved for concepts with insufficient information to code with codable children: Secondary | ICD-10-CM

## 2013-11-05 DIAGNOSIS — E1065 Type 1 diabetes mellitus with hyperglycemia: Secondary | ICD-10-CM

## 2013-11-05 NOTE — Progress Notes (Signed)
Diabetes Follow-Up Visit Chief Complaint:   No chief complaint on file.    Filed Vitals:   11/05/13 1454  BP: 98/62  Pulse: 80   Filed Weights   11/05/13 1454  Weight: 130 lb (58.968 kg)      HPI:  [name] is a 23 yo woman here for adjustment of insulin pump.  She was last seen by myself 01/2013.  Shortly after that appt she found out she was pregnant and her OB began adjusting and monitoring her DM.  She gave birth to a 9# 1 oz boy on 09/08/13.  She has another child - a girl who is about 73 years old.   [name] was diagnosed with type 1 DM in 2005 when she was 23yo and has a history of poor compliance with follow up and several hospital admissions for DKA  (admitted 5x in last 3 years for DKA).  She was started on an insulin pump 04/2012. She has a 723 [employer] - no CGM.  She uses Novolog insulin in pump.  She has not been checking her BG regularly.  She states that she can feel when her BG is elevated and that she feels that it is always elevated.  BG today in office was 61 - patient given lemonade to increase BG and rechecked 15 minutes later and was up to 84.  Reviewed downloaded insulin pump records - shows that pt has only checked BG 13 times in last 2 weeks - these were times that she felt BG was either too low or too high.  Average BG was 265 (69% above target and 23% below target.  Current Basal rates:  - 12 am- 6 am:  0.65 units/h       - 6 am - 12 am:  1.00 units/h    ICR: 12am - 12am 9.0 Target CBG: 100 ISF: 35  Insulin on board: active, at 4h    Exam Edema:  neg  Polyuria:  neg  Polydipsia:  neg Polyphagia:  neg  BMI:  Body mass index is 21.63 kg/(m^2).   Weight changes:  Decreasing - she is back to prepregnancy weight in 2 months. General Appearance:  alert, oriented, no acute distress and well nourished Mood/Affect:  normal   Low fat/carbohydrate diet?  No  - not eating regularly Nicotine Abuse?  No Medication Compliance?   Yes Exercise?  No Alcohol Abuse?  No  Home BG Monitoring:  Checking 0 to 1 time a day.    Lab Results  Component Value Date   HGBA1C 8.1 10/19/2013    No results found for this basename: [name]    Lab Results  Component Value Date   CHOL 169 05/24/2011   HDL 20* 05/24/2011   LDLCALC UNABLE TO CALCULATE IF TRIGLYCERIDE OVER 400 mg/dL 1/61/0960   TRIG 454* 0/98/1191   CHOLHDL 8.5 05/24/2011      Assessment: 1.  Diabetes.  Type 1 uncontrolled and non compliant with BG checking 2.  Blood Pressure.  controlled  Recommendations: 1.  Basal Rate changes - feel with inconsistent use of bolus wizard and checking BG we need to practically start over  Change basal rate:    12am to 4am - 0.65     4am to 6am - 0.70     6am to 6pm - 0.95    6pm to midnight / 12am - 0.800   Increase Insulin to CHO ratio to 12  Increase insulin sensitivity to 45  Target  BG increased temporarily to 110   Will have patient monitor BG more closely and make adjustments as needed 2.  Reviewed HBG goals:  Fasting 80-130 and 1-2 hour post prandial <180.  Patient is instructed to check BG 4 times per day.    3.  Reviewed s/s of hypoglycemia and hyperglycemia.  Patient is instructed to call office if BG is less than 70 or greater than 250 on more than 2 occassions in 1 week for pump adjustment.  4.  BP goal < 140/80. 5. Will follow up in 2 weeks for further adjustment   Time spent counseling patient:  45 minutes    Henrene Pastorammy Naureen Benton, PharmD, CPP

## 2013-11-06 ENCOUNTER — Encounter: Payer: PRIVATE HEALTH INSURANCE | Admitting: Obstetrics & Gynecology

## 2013-11-07 ENCOUNTER — Encounter: Payer: Self-pay | Admitting: Adult Health

## 2013-11-07 ENCOUNTER — Ambulatory Visit (INDEPENDENT_AMBULATORY_CARE_PROVIDER_SITE_OTHER): Payer: PRIVATE HEALTH INSURANCE | Admitting: Adult Health

## 2013-11-07 ENCOUNTER — Other Ambulatory Visit: Payer: PRIVATE HEALTH INSURANCE

## 2013-11-07 VITALS — BP 90/58 | Ht 65.0 in | Wt 134.0 lb

## 2013-11-07 DIAGNOSIS — Z3202 Encounter for pregnancy test, result negative: Secondary | ICD-10-CM

## 2013-11-07 DIAGNOSIS — Z30017 Encounter for initial prescription of implantable subdermal contraceptive: Secondary | ICD-10-CM

## 2013-11-07 DIAGNOSIS — Z3200 Encounter for pregnancy test, result unknown: Secondary | ICD-10-CM

## 2013-11-07 DIAGNOSIS — Z309 Encounter for contraceptive management, unspecified: Secondary | ICD-10-CM

## 2013-11-07 HISTORY — DX: Encounter for initial prescription of implantable subdermal contraceptive: Z30.017

## 2013-11-07 LAB — POCT URINE PREGNANCY: PREG TEST UR: NEGATIVE

## 2013-11-07 LAB — HCG, QUANTITATIVE, PREGNANCY: hCG, Beta Chain, Quant, S: <1 m[IU]/mL

## 2013-11-07 NOTE — Patient Instructions (Signed)
Use condoms x 2 weeks, keep clean and dry x 24 hours, no heavy lifting, keep steri strips on x 72 hours, Keep pressure dressing on x 24 hours. Follow up prn problems. Return in 6 months for pap and physical

## 2013-11-07 NOTE — Progress Notes (Signed)
Subjective:     Patient ID: [name], female   DOB: 1990/11/14, 23 y.o.   MRN: [id number]  HPI [name] is a 23 year old white female in for nexplanon insertion.  Review of Systems See HPI Reviewed past medical,surgical, social and family history. Reviewed medications and allergies.     Objective:   Physical Exam BP 90/58  Ht 5\' 5"  (1.651 m)  Wt 134 lb (60.782 kg)  BMI 22.30 kg/m2  Breastfeeding? NoUPT negative and had negative QHCG, last sex 2-3 weeks ago.Consent signed. Left arm cleansed with betadine, and injected with 1.5 cc 2% lidocaine and waited til numb. Nexplanon easily inserted and steri strips applied. Rod easily palpated by pt and provider.Pressure dressing applied.    Assessment:     Contraceptive management Nexplanon insertion     Plan:       Use condoms x 2 weeks, keep clean and dry x 24 hours, no heavy lifting, keep steri strips on x 72 hours, Keep pressure dressing on x 24 hours. Follow up prn problems.   Return in 6 months for pap and physical.

## 2013-11-08 ENCOUNTER — Telehealth: Payer: Self-pay

## 2013-11-08 NOTE — Telephone Encounter (Signed)
Spoke with pt. Got Nexplanon yesterday. Took bandage off today and has had a throbbing pain since then. Taking Tylenol and Ibuprofen with no help. Spoke with Dr. Despina HiddenEure. Advised to use ice and see if that makes a difference. Pt voiced understanding. JSY

## 2013-11-19 ENCOUNTER — Encounter: Payer: Self-pay | Admitting: Pharmacist

## 2013-11-19 ENCOUNTER — Ambulatory Visit (INDEPENDENT_AMBULATORY_CARE_PROVIDER_SITE_OTHER): Payer: PRIVATE HEALTH INSURANCE | Admitting: Pharmacist

## 2013-11-19 VITALS — BP 100/60 | HR 88 | Ht 65.0 in | Wt 133.0 lb

## 2013-11-19 DIAGNOSIS — E782 Mixed hyperlipidemia: Secondary | ICD-10-CM

## 2013-11-19 DIAGNOSIS — E781 Pure hyperglyceridemia: Secondary | ICD-10-CM

## 2013-11-19 DIAGNOSIS — E1065 Type 1 diabetes mellitus with hyperglycemia: Secondary | ICD-10-CM

## 2013-11-19 DIAGNOSIS — IMO0002 Reserved for concepts with insufficient information to code with codable children: Secondary | ICD-10-CM

## 2013-11-19 NOTE — Progress Notes (Signed)
Diabetes Follow-Up Visit Chief Complaint:   No chief complaint on file.    Filed Vitals:   11/19/13 0939  BP: 100/60  Pulse: 88   Filed Weights   11/19/13 0939  Weight: 133 lb (60.328 kg)      HPI:  [name] is a 23 yo woman here for adjustment of insulin pump.  She was last seen by myself 10/2013. Valaria was diagnosed with type 1 DM in 2005 when she was 23yo and has a history of poor compliance with follow up and several hospital admissions for DKA. She was started on an insulin pump 04/2012. She has a [place] - no CGM.  She uses Novolog insulin in pump.  She has been checking her BG more regularly compared to last visit.     Reviewed downloaded insulin pump records - shows that pt has only checked BG 28 times in last 2 weeks.   Average BG was 235 (down 30 points from last visit) and 61% above target (was 69%) and 18% below target (was 23%) Current Basal rates:      12am to 4am - 0.65     4am to 6am - 0.70     6am to 6pm - 0.95    6pm to midnight / 12am - 0.800   Insulin to CHO ratio:12  Insulin sensitivity: 45  Target BG: 110    Exam Edema:  neg  Polyuria:  neg  Polydipsia:  neg Polyphagia:  neg  BMI:  Body mass index is 22.13 kg/(m^2).   Weight changes:  Decreased by 1# over last month.    General Appearance:  alert, oriented, no acute distress and well nourished Mood/Affect:  normal   Low fat/carbohydrate diet?   improving Nicotine Abuse?  No Medication Compliance?  Yes Exercise?  No Alcohol Abuse?  No   Lab Results  Component Value Date   HGBA1C 8.1 10/19/2013    No results found for this basename: Derl Barrow    Lab Results  Component Value Date   CHOL 169 05/24/2011   HDL 20* 05/24/2011   LDLCALC UNABLE TO CALCULATE IF TRIGLYCERIDE OVER 400 mg/dL 05/24/2011   TRIG 690* 05/24/2011   CHOLHDL 8.5 05/24/2011      Assessment: 1.  Diabetes.  Type 1 uncontrolled, most lows in am and after really high BG and most  highs during day 2.  Blood Pressure.  Controlled 3.  Hypertirglyceridemia - last checked in 2012  Recommendations: 1.  Basal Rate changes - feel with inconsistent use of bolus wizard and checking BG we need to practically start over  Change basal rate:    12am to 7am - 0.65 (decreased)     7am to 6pm - 1.00 (increased)    6pm to midnight / 12am - 0.800   Decrease Insulin to CHO ratio to 10  Increase insulin sensitivity to 50  Target BG increased temporarily to 110   Continue to monitor BG more closely and make adjustments as needed 2.  Reviewed HBG goals:  Fasting 80-130 and 1-2 hour post prandial <180.  Patient is instructed to check BG 4 times per day.    3.  Reviewed s/s of hypoglycemia and hyperglycemia.  Patient is instructed to call office if BG is less than 70 or greater than 250 on more than 2 occassions in 1 week for pump adjustment.  4.  BP goal < 140/80. 5.   Orders Placed This Encounter  Procedures  . CMP14+EGFR  .  Lipid panel  . LDL Cholesterol, Direct    6. Will follow up in 4 weeks for further adjustment   Time spent counseling patient:  45 minutes    Cherre Robins, PharmD, CPP

## 2013-11-20 LAB — CMP14+EGFR
ALBUMIN: 4.3 g/dL (ref 3.5–5.5)
ALT: 21 IU/L (ref 0–32)
AST: 16 IU/L (ref 0–40)
Albumin/Globulin Ratio: 1.9 (ref 1.1–2.5)
Alkaline Phosphatase: 55 IU/L (ref 39–117)
BUN/Creatinine Ratio: 12 (ref 8–20)
BUN: 8 mg/dL (ref 6–20)
CO2: 24 mmol/L (ref 18–29)
CREATININE: 0.66 mg/dL (ref 0.57–1.00)
Calcium: 9.2 mg/dL (ref 8.7–10.2)
Chloride: 105 mmol/L (ref 97–108)
GFR calc Af Amer: 147 mL/min/{1.73_m2} (ref >59)
GFR calc non Af Amer: 128 mL/min/{1.73_m2} (ref >59)
GLOBULIN, TOTAL: 2.3 g/dL (ref 1.5–4.5)
Glucose: 73 mg/dL (ref 65–99)
Potassium: 4.3 mmol/L (ref 3.5–5.2)
Sodium: 144 mmol/L (ref 134–144)
Total Bilirubin: 0.2 mg/dL (ref 0.0–1.2)
Total Protein: 6.6 g/dL (ref 6.0–8.5)

## 2013-11-20 LAB — LDL CHOLESTEROL, DIRECT: LDL Direct: 88 mg/dL (ref 0–119)

## 2013-11-20 LAB — LIPID PANEL
Chol/HDL Ratio: 4.5 ratio units — ABNORMAL HIGH (ref 0.0–4.4)
Cholesterol, Total: 143 mg/dL (ref 100–189)
HDL: 32 mg/dL — AB (ref >39)
LDL CALC: 88 mg/dL (ref 0–119)
TRIGLYCERIDES: 116 mg/dL — AB (ref 0–114)
VLDL Cholesterol Cal: 23 mg/dL (ref 5–40)

## 2013-11-21 ENCOUNTER — Telehealth: Payer: Self-pay | Admitting: Pharmacist

## 2013-11-21 NOTE — Telephone Encounter (Signed)
Results from lipid panel 11/19/13 were at goals.  Recommend recheck in 1 year.

## 2013-11-23 ENCOUNTER — Encounter: Payer: Self-pay | Admitting: Physician Assistant

## 2013-11-23 ENCOUNTER — Ambulatory Visit (INDEPENDENT_AMBULATORY_CARE_PROVIDER_SITE_OTHER): Payer: PRIVATE HEALTH INSURANCE | Admitting: Physician Assistant

## 2013-11-23 VITALS — BP 105/68 | HR 90 | Temp 98.8°F | Ht 65.0 in | Wt 129.0 lb

## 2013-11-23 DIAGNOSIS — J069 Acute upper respiratory infection, unspecified: Secondary | ICD-10-CM

## 2013-11-23 DIAGNOSIS — J029 Acute pharyngitis, unspecified: Secondary | ICD-10-CM

## 2013-11-23 LAB — POCT RAPID STREP A (OFFICE): Rapid Strep A Screen: NEGATIVE

## 2013-11-23 MED ORDER — AZITHROMYCIN 250 MG PO TABS: ORAL_TABLET | ORAL | Status: DC

## 2013-11-23 MED ORDER — PREDNISONE (PAK) 10 MG PO TABS: ORAL_TABLET | ORAL | Status: DC

## 2013-11-23 NOTE — Patient Instructions (Signed)

## 2013-11-23 NOTE — Progress Notes (Signed)
   Subjective:    Patient ID: Patricia DuttonKristine Fox, female    DOB: 21-Jan-1991, 21 y.o.   MRN: [id number]  HPI 23 y/o female presents with c/o sore throat since yesterday. Associated intermittent productive cough x 3 weeks. Cough worse at night. Throat pain worse with swallowing. No known sick contacts    Review of Systems  Constitutional: Negative.   HENT: Positive for congestion (chest) and sore throat. Negative for ear discharge, ear pain, hearing loss, nosebleeds, postnasal drip, rhinorrhea, sinus pressure, sneezing, trouble swallowing and voice change.   Respiratory: Positive for cough (intermittently productive). Negative for apnea, choking, chest tightness, shortness of breath, wheezing and stridor.   Cardiovascular: Negative for chest pain.       Objective:   Physical Exam  Constitutional: She appears well-developed and well-nourished. No distress.  HENT:  Right Ear: External ear normal.  Left Ear: External ear normal.  Mouth/Throat: Uvula is midline. Posterior oropharyngeal edema and posterior oropharyngeal erythema present. No oropharyngeal exudate.  Pulmonary/Chest: Effort normal. She has wheezes (right sided , scattered, anterior and posterior BS). She has no rales. She exhibits no tenderness.  Musculoskeletal: She exhibits no tenderness.  Skin: She is not diaphoretic.          Assessment & Plan:  1 Bronchitis: Rx azithromycin to use as directed, prednisone dose pack. Advised patient to monitor blood sugars frequently since the prednisone would increase BS. Advised patient to stop smoking. Use cool mist humidifier. RTC if s/s worsen or do not improve. Otherwise RTC on 12/07/13 for reassessment.

## 2013-12-07 ENCOUNTER — Ambulatory Visit: Payer: PRIVATE HEALTH INSURANCE | Admitting: General Practice

## 2013-12-14 ENCOUNTER — Encounter: Payer: Self-pay | Admitting: Physician Assistant

## 2013-12-14 ENCOUNTER — Ambulatory Visit (INDEPENDENT_AMBULATORY_CARE_PROVIDER_SITE_OTHER): Payer: PRIVATE HEALTH INSURANCE | Admitting: Physician Assistant

## 2013-12-14 VITALS — BP 105/67 | HR 99 | Temp 99.1°F | Ht 65.0 in | Wt 127.0 lb

## 2013-12-14 DIAGNOSIS — F3189 Other bipolar disorder: Secondary | ICD-10-CM

## 2013-12-14 DIAGNOSIS — F3181 Bipolar II disorder: Secondary | ICD-10-CM

## 2013-12-14 NOTE — Progress Notes (Signed)
   Subjective:    Patient ID: [name], female    DOB: 1991/06/18, 23 y.o.   MRN: [id number]  HPI 23 y/o female presents with her mother who complains of daughters recent mood swings and increased agitation. Daughter denies noticing a change in behavior but other family members have noticed. She has 2 very young children ( 16 months and 3 months) and is going through a divorce and a job     Review of Systems  Constitutional: Negative for chills, activity change, appetite change and fatigue.  HENT: Negative.   Eyes: Negative.   Respiratory: Negative.   Cardiovascular: Negative.   Endocrine: Negative.   Neurological: Negative.   Psychiatric/Behavioral: Positive for sleep disturbance (wakes frequently due to having 2 young children). Negative for suicidal ideas, hallucinations, behavioral problems, confusion, self-injury, dysphoric mood, decreased concentration and agitation. The patient is not nervous/anxious and is not hyperactive.        Objective:   Physical Exam  Nursing note and vitals reviewed. Constitutional: She is oriented to person, place, and time. She appears well-developed and well-nourished. No distress.  HENT:  Head: Normocephalic and atraumatic.  Neck: Normal range of motion.  Cardiovascular: Normal rate, regular rhythm and normal heart sounds.  Exam reveals no gallop and no friction rub.   No murmur heard. Pulmonary/Chest: Effort normal and breath sounds normal.  Neurological: She is alert and oriented to person, place, and time. She has normal reflexes.  Skin: She is not diaphoretic.  Psychiatric: She has a normal mood and affect. Her behavior is normal. Judgment and thought content normal.          Assessment & Plan:  1. Bipolar II disorder with frequent mood swings: Discussed with patient that I do not feel that prescribing a medication would be a good treatment plan at this time due to her having two small children and comorbidities of DM and Bipolar  Disorder. She is reluctant to remove Nexplanon at this time as a possible causse of mood disorder so I suggested seeking professional counseling. Mother and patient agree that this is a good plan. List of facilities was given to patient for f/u. F/U in 1 months for reassessment.

## 2013-12-17 ENCOUNTER — Ambulatory Visit (INDEPENDENT_AMBULATORY_CARE_PROVIDER_SITE_OTHER): Payer: PRIVATE HEALTH INSURANCE | Admitting: Pharmacist

## 2013-12-17 ENCOUNTER — Encounter: Payer: Self-pay | Admitting: Pharmacist

## 2013-12-17 VITALS — BP 100/68 | HR 80 | Ht 65.0 in | Wt 127.0 lb

## 2013-12-17 DIAGNOSIS — IMO0002 Reserved for concepts with insufficient information to code with codable children: Secondary | ICD-10-CM

## 2013-12-17 DIAGNOSIS — E1065 Type 1 diabetes mellitus with hyperglycemia: Secondary | ICD-10-CM

## 2013-12-17 NOTE — Progress Notes (Signed)
Diabetes Follow-Up Visit Chief Complaint:   No chief complaint on file.    Filed Vitals:   12/17/13 1028  BP: 100/68  Pulse: 80   Filed Weights   12/17/13 1028  Weight: 127 lb (57.607 kg)      HPI:  [name] is a 23 yo woman here for adjustment of insulin pump.  She was last seen by myself 11/2013. Starlina was diagnosed with type 1 DM in 2005 when she was 23yo and has a history of poor compliance with follow up and several hospital admissions for DKA. She was started on an insulin pump 04/2012. She has a 723 [employer] - no CGM.  She uses Novolog insulin in pump.  She has been checking her BG more regularly compared to last visit.     Reviewed downloaded insulin pump records - shows that pt has only checked BG 9 times in last 2 weeks.   Average BG was 374 and 89% above target  and 11% below target      Current Basal rates:     12am to 7am - 0.65 (decreased)     7am to 6pm - 1.00 (increased)     6pm to midnight / 12am - 0.800     Insulin to CHO ratio to 10    insulin sensitivity to 50    Target BG 110    Exam Edema:  neg  Polyuria:  neg  Polydipsia:  neg Polyphagia:  neg  BMI:  Body mass index is 21.13 kg/(m^2).   Weight changes:  Decreased by 2# over last month.    General Appearance:  alert, oriented, no acute distress and well nourished Mood/Affect:  normal   Low fat/carbohydrate diet?   improving Nicotine Abuse?  Yes -0.5 PPD Medication Compliance?  Yes Exercise?  No Alcohol Abuse?  No   Lab Results  Component Value Date   HGBA1C 8.1 10/19/2013    No results found for this basenameConcepcion Elk    Lab Results  Component Value Date   CHOL 169 05/24/2011   HDL 32* 11/19/2013   LDLCALC 88 11/19/2013   LDLDIRECT 88 11/19/2013   TRIG 116* 11/19/2013   CHOLHDL 4.5* 11/19/2013      Assessment: 1.  Diabetes.  Type 1 uncontrolled, most lows in am and after really high BG and most highs during day 2.  Blood Pressure.   Controlled   Recommendations: 1.  Basal Rate changes - feel with inconsistent use of bolus wizard and checking BG we need to practically start over  Change basal rate:    12am to 7am - 0.65      7am to 6pm - 1.10 (increased)    6pm to midnight / 12am - 0.850 (increased)    Insulin to CHO ratio to 10   insulin sensitivity to 50  Target BG  110   Continue to monitor BG more closely and make adjustments as needed - filled out paper work to see if [employer] will allow for continuous glucose monitoring which I think would help immensely with BG control.  2.  Reviewed HBG goals:  Fasting 80-130 and 1-2 hour post prandial <180.  Patient is instructed to check BG 4 times per day.    3.  Reviewed s/s of hypoglycemia and hyperglycemia.  Patient is instructed to call office if BG is less than 70 or greater than 250 on more than 2 occassions in 1 week for pump adjustment.  4.  BP  goal < 140/80.  6. Will follow up in 4 weeks for further adjustment   Time spent counseling patient:  30 minutes    Henrene Pastorammy Alysandra Lobue, PharmD, CPP

## 2013-12-26 ENCOUNTER — Encounter: Payer: Self-pay | Admitting: *Deleted

## 2014-01-14 ENCOUNTER — Ambulatory Visit: Payer: PRIVATE HEALTH INSURANCE | Admitting: General Practice

## 2014-01-15 ENCOUNTER — Ambulatory Visit: Payer: PRIVATE HEALTH INSURANCE | Admitting: General Practice

## 2014-02-06 ENCOUNTER — Telehealth: Payer: Self-pay | Admitting: General Practice

## 2014-02-12 NOTE — Telephone Encounter (Signed)
Faxed form 02/12/14

## 2014-02-21 ENCOUNTER — Ambulatory Visit: Payer: Self-pay

## 2014-03-14 ENCOUNTER — Ambulatory Visit: Payer: Self-pay

## 2014-03-21 ENCOUNTER — Encounter: Payer: Self-pay | Admitting: Pharmacist

## 2014-03-21 ENCOUNTER — Ambulatory Visit (INDEPENDENT_AMBULATORY_CARE_PROVIDER_SITE_OTHER): Payer: PRIVATE HEALTH INSURANCE | Admitting: Pharmacist

## 2014-03-21 VITALS — BP 92/62 | HR 80 | Ht 65.0 in | Wt 119.0 lb

## 2014-03-21 DIAGNOSIS — E1065 Type 1 diabetes mellitus with hyperglycemia: Secondary | ICD-10-CM

## 2014-03-21 DIAGNOSIS — IMO0002 Reserved for concepts with insufficient information to code with codable children: Secondary | ICD-10-CM

## 2014-03-21 LAB — POCT GLYCOSYLATED HEMOGLOBIN (HGB A1C): HEMOGLOBIN A1C: 11.4

## 2014-03-21 MED ORDER — INSULIN ASPART 100 UNIT/ML ~~LOC~~ SOLN: SUBCUTANEOUS | Status: DC

## 2014-03-21 NOTE — Patient Instructions (Signed)
Diabetes and Standards of Medical Care  Diabetes is complicated. You may find that your diabetes team includes a dietitian, nurse, diabetes educator, eye doctor, and more. To help everyone know what is going on and to help you get the care you deserve, the following schedule of care was developed to help keep you on track. Below are the tests, exams, vaccines, medicines, education, and plans you will need.  Need to check blood glucose prior to each meal and administer insulin according to blood glucose and how much carbohydrates you are eating.  Blood Glucose Goals Prior to meals = 80 - 130 Within 2 hours of the start of a meal = less than 180  HbA1c test (goal is less than 7.0% - your last value was 11.4%) This test shows how well you have controlled your glucose over the past 2 3 months. It is used to see if your diabetes management plan needs to be adjusted.   It is performed at least 2 times a year if you are meeting treatment goals.  It is performed 4 times a year if therapy has changed or if you are not meeting treatment goals.   Blood pressure test  This test is performed at every routine medical visit. The goal is less than 140/90 mmHg for most people, but 130/80 mmHg in some cases. Ask your health care  about your goal. Dental exam  Follow up with the dentist regularly. Eye exam  If you are diagnosed with type 1 diabetes as a child, get an exam upon reaching the age of 55 years or older and have had diabetes for 3 5 years. Yearly eye exams are recommended after that initial eye exam.  If you are diagnosed with type 1 diabetes as an adult, get an exam within 5 years of diagnosis and then yearly.  If you are diagnosed with type 2 diabetes, get an exam as soon as possible after the diagnosis and then yearly. Foot care exam  Visual foot exams are performed at every routine medical visit. The exams check for cuts, injuries, or other problems with the feet.  A  comprehensive foot exam should be done yearly. This includes visual inspection as well as assessing foot pulses and testing for loss of sensation.  Check your feet nightly for cuts, injuries, or other problems with your feet. Tell your health care  if anything is not healing. Kidney function test (urine microalbumin)  This test is performed once a year.  Type 1 diabetes: The first test is performed 5 years after diagnosis.  Type 2 diabetes: The first test is performed at the time of diagnosis.  A serum creatinine and estimated glomerular filtration rate (eGFR) test is done once a year to assess the level of chronic kidney disease (CKD), if present. Lipid profile (cholesterol, HDL, LDL, triglycerides)  Performed every 5 years for most people.  The goal for LDL is less than 100 mg/dL. If you are at high risk, the goal is less than 70 mg/dL.  The goal for HDL is 40 mg/dL 50 mg/dL for men and 50 mg/dL 60 mg/dL for women. An HDL cholesterol of 60 mg/dL or higher gives some protection against heart disease.  The goal for triglycerides is less than 150 mg/dL. Influenza vaccine, pneumococcal vaccine, and hepatitis B vaccine  The influenza vaccine is recommended yearly.  The pneumococcal vaccine is generally given once in a lifetime. However, there are some instances when another vaccination is recommended. Check with your health care  .  The hepatitis B vaccine is also recommended for adults with diabetes. Diabetes self-management education  Education is recommended at diagnosis and ongoing as needed. Treatment plan  Your treatment plan is reviewed at every medical visit. Document Released: 07/18/2009 Document Revised: 05/23/2013 Document Reviewed: 02/20/2013 [facility] Patient Information 2014 [place].

## 2014-03-21 NOTE — Progress Notes (Signed)
Diabetes Follow-Up Visit Chief Complaint:   Chief Complaint  Patient presents with  . Diabetes     Filed Vitals:   03/21/14 0930  BP: 92/62  Pulse: 80   Filed Weights   03/21/14 0930  Weight: 119 lb (53.978 kg)      HPI:  [name] is a 23 yo woman here for adjustment of insulin pump.  She was last seen by myself 12/2013.  [name] was diagnosed with type 1 DM in 2005 when she was 23yo and has a history of poor compliance with follow up and several hospital admissions for DKA. She was started on an insulin pump 04/2012. She has a 723 [employer] - no CGM.  She uses Novolog insulin in pump.  I downloaded her pump records and she has only checked BG 3 times in the last 2 weeks.  Patient reports that it is difficult to remember to check BG because she is so busy with work (works at [employer]) and 2 children.  Average BG was 298 and 100% above target       Current Basal rates:     12am to 7am - 0.65      7am - 6pm - 1.10     6pm to midnight / 12am - 0.850     Insulin to CHO ratio to 10    insulin sensitivity to 50    Target BG 110    Exam Edema:  neg  Polyuria:  neg  Polydipsia:  neg Polyphagia:  neg  BMI:  Body mass index is 19.8 kg/(m^2).   Weight changes:  Decreased by 8# over last 3 months   General Appearance:  alert, oriented, no acute distress and well nourished Mood/Affect:  normal   Low fat/carbohydrate diet?   improving Nicotine Abuse?  Yes -0.5 PPD Medication Compliance?  Yes Exercise?  No Alcohol Abuse?  No   Lab Results  Component Value Date   HGBA1C 11.4 03/21/2014    No results found for this basenameConcepcion Fox: MICROALBUR,  [id number]    Lab Results  Component Value Date   CHOL 169 05/24/2011   HDL 32* 11/19/2013   LDLCALC 88 11/19/2013   LDLDIRECT 88 11/19/2013   TRIG 116* 11/19/2013   CHOLHDL 4.5* 11/19/2013      Assessment: 1.  Diabetes.  Type 1 uncontrolled - due to poor compliance with self BG monitoring 2.   Blood Pressure.  Controlled 3.  Depression - patient denies but I do think she is having some struggles with taking control of DM and is in denial about the long term complications of uncontrolled DM   Recommendations: 1.  Basal Rate changes - I explained to patient that she is not using boluses enough for pump to effectively manage diabetes.  Without checking BG prior to meals and administering boluses I cannot adequately determine what needs to be adjusted regarding her insulin pump. 2.  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  3.  Reviewed HBG goals:  Fasting 80-130 and 1-2 hour post prandial <180.      4.  Reviewed s/s of hypoglycemia and hyperglycemia.  Patient is instructed to call office if BG is less than 70 or greater than 250 on more than  2 occassions in 1 week for pump adjustment.  5.  BP goal < 140/80. 6.  Patient states that she is having trouble with infusion set - I recommended that she change infusion set every 3 days (currently changing "when insulin runs out") and then she should only use about 1.645ml of insulin per filling of resevoir not 2ml - this will prevent wasting of insulin  7. Will follow up in 4 weeks for further adjustment   Time spent counseling patient:  60 minutes    Henrene Pastorammy Eckard, PharmD, CPP.CDE

## 2014-03-22 ENCOUNTER — Telehealth: Payer: Self-pay | Admitting: Pharmacist

## 2014-03-22 LAB — MICROALBUMIN, URINE: MICROALBUM., U, RANDOM: 18.6 ug/mL — AB (ref 0.0–17.0)

## 2014-03-22 NOTE — Telephone Encounter (Signed)
Patient called about elvated microablumin.  Discussed the need to get better BG control to prevent kidney damage.  Patient voices understanding and will check BG more and call if she is getting more than 1 reading per week greater than 200.

## 2014-04-03 ENCOUNTER — Telehealth: Payer: Self-pay | Admitting: Pharmacist

## 2014-04-03 NOTE — Telephone Encounter (Signed)
Opened in error

## 2014-04-08 ENCOUNTER — Telehealth: Payer: Self-pay | Admitting: Family Medicine

## 2014-04-08 ENCOUNTER — Encounter: Payer: Self-pay | Admitting: Family

## 2014-04-08 ENCOUNTER — Ambulatory Visit (INDEPENDENT_AMBULATORY_CARE_PROVIDER_SITE_OTHER): Payer: PRIVATE HEALTH INSURANCE | Admitting: Family

## 2014-04-08 VITALS — BP 105/66 | HR 87 | Temp 98.1°F | Ht 65.0 in | Wt 120.8 lb

## 2014-04-08 DIAGNOSIS — N390 Urinary tract infection, site not specified: Secondary | ICD-10-CM

## 2014-04-08 DIAGNOSIS — R300 Dysuria: Secondary | ICD-10-CM

## 2014-04-08 MED ORDER — NITROFURANTOIN MONOHYD MACRO 100 MG PO CAPS: 100.0000 mg | ORAL_CAPSULE | Freq: Two times a day (BID) | ORAL | Status: DC

## 2014-04-08 NOTE — Telephone Encounter (Signed)
Appt given for today per patients request 

## 2014-04-08 NOTE — Patient Instructions (Signed)

## 2014-04-08 NOTE — Progress Notes (Signed)
   Subjective:    Patient ID: [name], female    DOB: October 19, 1990, 23 y.o.   MRN: [id number]  Dysuria  This is a new problem. The current episode started in the past 7 days ("Friday night"). The problem occurs intermittently. The problem has been waxing and waning. The quality of the pain is described as burning. The pain is at a severity of 8/10. The pain is moderate. There has been no fever. She is sexually active. There is a history of pyelonephritis. Associated symptoms include frequency and nausea. Pertinent negatives include no chills, discharge, flank pain, hematuria, hesitancy or vomiting. She has tried nothing for the symptoms. The treatment provided no relief.      Review of Systems  Constitutional: Negative.  Negative for chills.  HENT: Negative.   Eyes: Negative.   Respiratory: Negative.  Negative for shortness of breath.   Cardiovascular: Negative.  Negative for palpitations.  Gastrointestinal: Positive for nausea. Negative for vomiting.  Endocrine: Negative.   Genitourinary: Positive for dysuria and frequency. Negative for hesitancy, hematuria and flank pain.  Musculoskeletal: Negative.   Neurological: Negative.  Negative for headaches.  Hematological: Negative.   Psychiatric/Behavioral: Negative.   All other systems reviewed and are negative.      Objective:   Physical Exam  Vitals reviewed. Constitutional: She is oriented to person, place, and time. She appears well-developed and well-nourished. No distress.  Cardiovascular: Normal rate, regular rhythm, normal heart sounds and intact distal pulses.   No murmur heard. Pulmonary/Chest: Effort normal and breath sounds normal. No respiratory distress. She has no wheezes.  Abdominal: Soft. Bowel sounds are normal. She exhibits no distension. There is no tenderness.  Musculoskeletal: Normal range of motion. She exhibits no edema and no tenderness.  Negative for CVA tenderness   Neurological: She is alert and  oriented to person, place, and time.  Skin: Skin is warm and dry.  Psychiatric: She has a normal mood and affect. Her behavior is normal. Judgment and thought content normal.      BP 105/66  Pulse 87  Temp(Src) 98.1 F (36.7 C) (Oral)  Ht 5\' 5"  (1.651 m)  Wt 120 lb 12.8 oz (54.795 kg)  BMI 20.10 kg/m2     Assessment & Plan:  1. Dysuria - POCT urinalysis dipstick - POCT UA - Microscopic Only  2. Urinary tract infection without hematuria, site unspecified -Force fluids AZO over the counter X2 days RTO prn - nitrofurantoin, macrocrystal-monohydrate, (MACROBID) 100 MG capsule; Take 1 capsule (100 mg total) by mouth 2 (two) times daily.  Dispense: 10 capsule; Refill: 0  Jannifer Rodneyhristy Tracina Beaumont, FNP

## 2014-04-25 ENCOUNTER — Encounter: Payer: Self-pay | Admitting: Nurse Practitioner

## 2014-04-25 ENCOUNTER — Ambulatory Visit (INDEPENDENT_AMBULATORY_CARE_PROVIDER_SITE_OTHER): Payer: PRIVATE HEALTH INSURANCE | Admitting: Nurse Practitioner

## 2014-04-25 ENCOUNTER — Ambulatory Visit: Payer: Self-pay

## 2014-04-25 VITALS — BP 98/62 | HR 78 | Temp 98.3°F | Ht 65.75 in | Wt 120.6 lb

## 2014-04-25 DIAGNOSIS — R112 Nausea with vomiting, unspecified: Secondary | ICD-10-CM | POA: Insufficient documentation

## 2014-04-25 NOTE — Patient Instructions (Signed)

## 2014-04-25 NOTE — Progress Notes (Signed)
   Subjective:    Patient ID: [name], female    DOB: 10/27/1990, 23 y.o.   MRN: [id number]  HPI Patient is here today complaining of nausea and vomiting. She reported feeling sick during the night and vomited 5 or 6 times. Denies any diarrhea, denies a fever. She denies taking any medication to relief her symptoms. She reports both of her children was sick with a virus. Has light breakfast.    Review of Systems  Constitutional: Negative.   HENT: Negative.   Eyes: Positive for discharge.  Respiratory: Negative.   Cardiovascular: Negative.   Gastrointestinal: Positive for nausea and vomiting.  Endocrine: Negative.   Genitourinary: Negative.   Musculoskeletal: Negative.   Skin: Negative.   Allergic/Immunologic: Negative.   Neurological: Negative.   Hematological: Negative.   Psychiatric/Behavioral: Negative.        Objective:   Physical Exam  Constitutional: She is oriented to person, place, and time. She appears well-developed and well-nourished.  HENT:  Head: Normocephalic.  Eyes: Pupils are equal, round, and reactive to light.  Neck: Normal range of motion.  Cardiovascular: Normal rate.   Pulmonary/Chest: Effort normal.  Abdominal: Soft.  Neurological: She is alert and oriented to person, place, and time.    BP 98/62  Pulse 78  Temp(Src) 98.3 F (36.8 C) (Oral)  Ht 5' 5.75" (1.67 m)  Wt 120 lb 9.6 oz (54.704 kg)  BMI 19.61 kg/m2       Assessment & Plan:  1. Non-intractable vomiting with nausea, vomiting of unspecified type  Encourage to increase fluid intake Take tylenol for elevated temperature Eat small frequent meals.  RTO PRn  Mary-Margaret Daphine DeutscherMartin, FNP

## 2014-04-30 ENCOUNTER — Ambulatory Visit: Payer: Self-pay

## 2014-05-02 ENCOUNTER — Encounter: Payer: Self-pay | Admitting: Pharmacist

## 2014-05-02 ENCOUNTER — Ambulatory Visit (INDEPENDENT_AMBULATORY_CARE_PROVIDER_SITE_OTHER): Payer: PRIVATE HEALTH INSURANCE | Admitting: Pharmacist

## 2014-05-02 VITALS — BP 90/60 | HR 82 | Ht 65.75 in | Wt 119.5 lb

## 2014-05-02 DIAGNOSIS — E1065 Type 1 diabetes mellitus with hyperglycemia: Secondary | ICD-10-CM

## 2014-05-02 DIAGNOSIS — IMO0002 Reserved for concepts with insufficient information to code with codable children: Secondary | ICD-10-CM

## 2014-05-02 MED ORDER — INSULIN ASPART 100 UNIT/ML ~~LOC~~ SOLN: SUBCUTANEOUS | Status: DC

## 2014-05-02 NOTE — Progress Notes (Signed)
Diabetes Follow-Up Visit Chief Complaint:   Chief Complaint  Patient presents with  . Diabetes     Filed Vitals:   05/02/14 1548  BP: 90/60  Pulse: 82   Filed Weights   05/02/14 1548  Weight: 119 lb 8 oz (54.205 kg)    HPI:  [name] is a 23 yo woman here for adjustment of insulin pump.  She was last seen by myself 03/2014.  [name] was diagnosed with type 1 DM in 2005 when she was 23yo and has a history of poor compliance with follow up.   She was started on an insulin pump 04/2012. She has a 723 [employer] - no CGM.  She uses Novolog insulin in pump.  I downloaded her pump records and she has not checked her BG at all in the last 2 weeks.  Patient reports that it is difficult to remember to check BG because she is so busy with work (works at [employer]) and 2 children.  Average BG - unable to calculate because no BG readings Patient denies episodes of hypoglycemia in the last 2 weeks      Current Basal rates:     12am to 7am - 0.65      7am - 6pm - 1.10     6pm to midnight / 12am - 0.850     Insulin to CHO ratio to 10    insulin sensitivity to 50    Target BG 110  Exam Edema:  neg  Polyuria:  neg  Polydipsia:  neg Polyphagia:  neg  BMI:  Body mass index is 19.44 kg/(m^2).   Weight changes:  Has begun to stablize General Appearance:  alert, oriented, no acute distress and well nourished Mood/Affect:  normal   Low fat/carbohydrate diet?   improving Nicotine Abuse?  Yes -0.5 PPD, not interested in quitting currently Medication Compliance?  Yes, but not compliant in checking BG Exercise?  No Alcohol Abuse?  No   Lab Results  Component Value Date   HGBA1C 11.4 03/21/2014    No results found for this basenameConcepcion Elk: MICROALBUR,  [id number]    Lab Results  Component Value Date   CHOL 169 05/24/2011   HDL 32* 11/19/2013   LDLCALC 88 11/19/2013   LDLDIRECT 88 11/19/2013   TRIG 116* 11/19/2013   CHOLHDL 4.5* 11/19/2013       Assessment: 1.  Diabetes.  Type 1 uncontrolled - due to poor compliance with self BG monitoring 2.  Blood Pressure.  Controlled  Recommendations: 1.  Basal Rate changes - I explained to patient that she is not checking BG enough for pump to effectively manage diabetes.  Without checking BG prior to meals and administering boluses I cannot adequately determine what needs to be adjusted regarding her insulin pump. 2.  She is instructed to check BG at a minimum tid 3.  Reviewed HBG goals:  Fasting 80-130 and 1-2 hour post prandial <180.      4.  Reviewed s/s of hypoglycemia and hyperglycemia.  Patient is instructed to call office if BG is less than 70 or greater than 250 on more than 2 occassions in 1 week for pump adjustment.  5.  BP goal < 140/80. 6.  I have contacted [employer] to inquire about CGM - they are willing to check with patient's insurance but they need additional information from patient.  Patricia HireKristen is given phone number for [name] at [employer] 7. Will follow up in 4 weeks for  further adjustment   Time spent counseling patient:  40 minutes    Henrene Pastor, PharmD, CPP.CDE

## 2014-05-02 NOTE — Patient Instructions (Signed)
Diabetes and Standards of Medical Care  Diabetes is complicated. You may find that your diabetes team includes a dietitian, nurse, diabetes educator, eye doctor, and more. To help everyone know what is going on and to help you get the care you deserve, the following schedule of care was developed to help keep you on track. Below are the tests, exams, vaccines, medicines, education, and plans you will need.  Blood Glucose Goals Prior to meals = 80 - 130 Within 2 hours of the start of a meal = less than 180  HbA1c test (goal is less than 7.0% - your last value was 7.1%) This test shows how well you have controlled your glucose over the past 2 3 months. It is used to see if your diabetes management plan needs to be adjusted.   It is performed at least 2 times a year if you are meeting treatment goals.  It is performed 4 times a year if therapy has changed or if you are not meeting treatment goals.   Blood pressure test  This test is performed at every routine medical visit. The goal is less than 140/90 mmHg for most people, but 130/80 mmHg in some cases. Ask your health care provider about your goal. Dental exam  Follow up with the dentist regularly. Eye exam  If you are diagnosed with type 1 diabetes as a child, get an exam upon reaching the age of 10 years or older and have had diabetes for 3 5 years. Yearly eye exams are recommended after that initial eye exam.  If you are diagnosed with type 1 diabetes as an adult, get an exam within 5 years of diagnosis and then yearly.  If you are diagnosed with type 2 diabetes, get an exam as soon as possible after the diagnosis and then yearly. Foot care exam  Visual foot exams are performed at every routine medical visit. The exams check for cuts, injuries, or other problems with the feet.  A comprehensive foot exam should be done yearly. This includes visual inspection as well as assessing foot pulses and testing for loss of sensation.  Check  your feet nightly for cuts, injuries, or other problems with your feet. Tell your health care provider if anything is not healing. Kidney function test (urine microalbumin)  This test is performed once a year.  Type 1 diabetes: The first test is performed 5 years after diagnosis.  Type 2 diabetes: The first test is performed at the time of diagnosis.  A serum creatinine and estimated glomerular filtration rate (eGFR) test is done once a year to assess the level of chronic kidney disease (CKD), if present. Lipid profile (cholesterol, HDL, LDL, triglycerides)  Performed every 5 years for most people.  The goal for LDL is less than 100 mg/dL. If you are at high risk, the goal is less than 70 mg/dL.  The goal for HDL is 40 mg/dL 50 mg/dL for men and 50 mg/dL 60 mg/dL for women. An HDL cholesterol of 60 mg/dL or higher gives some protection against heart disease.  The goal for triglycerides is less than 150 mg/dL. Influenza vaccine, pneumococcal vaccine, and hepatitis B vaccine  The influenza vaccine is recommended yearly.  The pneumococcal vaccine is generally given once in a lifetime. However, there are some instances when another vaccination is recommended. Check with your health care provider.  The hepatitis B vaccine is also recommended for adults with diabetes. Diabetes self-management education  Education is recommended at diagnosis and ongoing   as needed. Treatment plan  Your treatment plan is reviewed at every medical visit.

## 2014-05-31 ENCOUNTER — Ambulatory Visit (INDEPENDENT_AMBULATORY_CARE_PROVIDER_SITE_OTHER): Payer: PRIVATE HEALTH INSURANCE | Admitting: *Deleted

## 2014-05-31 DIAGNOSIS — Z111 Encounter for screening for respiratory tuberculosis: Secondary | ICD-10-CM

## 2014-05-31 NOTE — Patient Instructions (Signed)

## 2014-05-31 NOTE — Progress Notes (Signed)
Tb skin test placed on left forearm and patient tolerated well

## 2014-06-03 ENCOUNTER — Encounter: Payer: Self-pay | Admitting: *Deleted

## 2014-06-03 LAB — TB SKIN TEST
Induration: 0 mm
TB Skin Test: NEGATIVE

## 2014-06-27 ENCOUNTER — Ambulatory Visit: Payer: Self-pay

## 2014-07-04 ENCOUNTER — Telehealth: Payer: Self-pay | Admitting: Family Medicine

## 2014-07-10 NOTE — Telephone Encounter (Signed)
Several calls have been made to patient and messages left for her to call us back

## 2014-07-19 ENCOUNTER — Ambulatory Visit (INDEPENDENT_AMBULATORY_CARE_PROVIDER_SITE_OTHER): Payer: Commercial Managed Care - PPO | Admitting: Nurse Practitioner

## 2014-07-19 ENCOUNTER — Encounter: Payer: Self-pay | Admitting: Nurse Practitioner

## 2014-07-19 VITALS — BP 91/63 | HR 75 | Temp 97.7°F | Ht 65.75 in | Wt 122.0 lb

## 2014-07-19 DIAGNOSIS — R350 Frequency of micturition: Secondary | ICD-10-CM

## 2014-07-19 DIAGNOSIS — N3000 Acute cystitis without hematuria: Secondary | ICD-10-CM

## 2014-07-19 LAB — POCT UA - MICROALBUMIN: MICROALBUMIN (UR) POC: NEGATIVE mg/L

## 2014-07-19 LAB — POCT UA - MICROSCOPIC ONLY
Bacteria, U Microscopic: NEGATIVE
CASTS, UR, LPF, POC: NEGATIVE
Crystals, Ur, HPF, POC: NEGATIVE
Mucus, UA: NEGATIVE
RBC, urine, microscopic: NEGATIVE
YEAST UA: NEGATIVE

## 2014-07-19 MED ORDER — SULFAMETHOXAZOLE-TMP DS 800-160 MG PO TABS: 1.0000 | ORAL_TABLET | Freq: Two times a day (BID) | ORAL | Status: DC

## 2014-07-19 MED ORDER — PHENAZOPYRIDINE HCL 200 MG PO TABS: 200.0000 mg | ORAL_TABLET | Freq: Three times a day (TID) | ORAL | Status: DC | PRN

## 2014-07-19 MED ORDER — FLUCONAZOLE 150 MG PO TABS: 150.0000 mg | ORAL_TABLET | Freq: Once | ORAL | Status: DC

## 2014-07-19 NOTE — Patient Instructions (Signed)

## 2014-07-19 NOTE — Progress Notes (Signed)
   Subjective:    Patient ID: [name], female    DOB: 1991/02/23, 23 y.o.   MRN: [id number]  HPI Patient in today c/o dysuria, frequency and urgency- Has been taking ibuprofen and tylenol.    Review of Systems  Constitutional: Negative.   Respiratory: Negative.   Cardiovascular: Negative.   Genitourinary: Positive for dysuria, urgency, frequency and pelvic pain.  Neurological: Negative.   Psychiatric/Behavioral: Negative.   All other systems reviewed and are negative.      Objective:   Physical Exam  Constitutional: She is oriented to person, place, and time. She appears well-developed and well-nourished.  Cardiovascular: Normal rate and normal heart sounds.   Pulmonary/Chest: Effort normal and breath sounds normal.  Abdominal: Soft. Bowel sounds are normal. She exhibits no distension and no mass. There is no tenderness. There is no rebound and no guarding.  Genitourinary:  Mild right CVA tnederness No left CVA tenderness  Neurological: She is alert and oriented to person, place, and time.  Skin: Skin is warm and dry.  Psychiatric: She has a normal mood and affect. Her behavior is normal. Judgment and thought content normal.   BP 91/63  Pulse 75  Temp(Src) 97.7 F (36.5 C) (Oral)  Ht 5' 5.75" (1.67 m)  Wt 122 lb (55.339 kg)  BMI 19.84 kg/m2   Results for orders placed in visit on 07/19/14  POCT UA - MICROSCOPIC ONLY      Result Value Ref Range   WBC, Ur, HPF, POC 15-20     RBC, urine, microscopic NEG     Bacteria, U Microscopic NEG     Mucus, UA NEG     Epithelial cells, urine per micros OCC     Crystals, Ur, HPF, POC NEG     Casts, Ur, LPF, POC NEG     Yeast, UA NEG           Assessment & Plan:   1. Acute cystitis without hematuria   2. Frequent urination    Meds ordered this encounter  Medications  . sulfamethoxazole-trimethoprim (BACTRIM DS) 800-160 MG per tablet    Sig: Take 1 tablet by mouth 2 (two) times daily.    Dispense:  20 tablet    Refill:  0    Order Specific Question:  Supervising Provider    Answer:  Ernestina PennaMOORE, DONALD W [[id number]]  . fluconazole (DIFLUCAN) 150 MG tablet    Sig: Take 1 tablet (150 mg total) by mouth once.    Dispense:  1 tablet    Refill:  0    Order Specific Question:  Supervising Provider    Answer:  Ernestina PennaMOORE, DONALD W [[id number]]  . phenazopyridine (PYRIDIUM) 200 MG tablet    Sig: Take 1 tablet (200 mg total) by mouth 3 (three) times daily as needed for pain.    Dispense:  6 tablet    Refill:  0    Order Specific Question:  Supervising Provider    Answer:  Ernestina PennaMOORE, DONALD W [[id number]]   Force fluids Motrin or tylenol OTC as needed RTO prn  Patricia Daphine DeutscherMartin, FNP

## 2014-07-25 ENCOUNTER — Ambulatory Visit (INDEPENDENT_AMBULATORY_CARE_PROVIDER_SITE_OTHER): Payer: Commercial Managed Care - PPO | Admitting: Pharmacist

## 2014-07-25 ENCOUNTER — Encounter: Payer: Self-pay | Admitting: Pharmacist

## 2014-07-25 VITALS — BP 82/58 | HR 80 | Ht 66.0 in | Wt 122.0 lb

## 2014-07-25 DIAGNOSIS — E1065 Type 1 diabetes mellitus with hyperglycemia: Secondary | ICD-10-CM

## 2014-07-25 DIAGNOSIS — IMO0002 Reserved for concepts with insufficient information to code with codable children: Secondary | ICD-10-CM

## 2014-07-25 LAB — POCT GLYCOSYLATED HEMOGLOBIN (HGB A1C): Hemoglobin A1C: 12.7

## 2014-07-25 MED ORDER — INSULIN ASPART 100 UNIT/ML ~~LOC~~ SOLN: SUBCUTANEOUS | Status: DC

## 2014-07-25 NOTE — Progress Notes (Signed)
Diabetes Follow-Up Visit Chief Complaint:   Chief Complaint  Patient presents with  . Diabetes     Filed Vitals:   07/25/14 1628  BP: 82/58  Pulse: 80   Filed Weights   07/25/14 1628  Weight: 122 lb (55.339 kg)    HPI:  [name] is a 23 yo woman here for adjustment of insulin pump.  She was last seen by myself 04/2014. Several appointment have been rescheduled due to her work schedule. [name] was diagnosed with type 1 DM in 2005 when she was 23yo and has a history of poor compliance with follow up.   She was started on an insulin pump 04/2012. She has a Scientist, [employer].  No CGM, though we have tried to get CGM but her insurance requires records that she is checking BG at least 4 times daily.  [name] has not been checking as often as prescribed.   She uses Novolog insulin in pump.   I downloaded her pump records and it indicates that she was checked her BG on average of 1.2 times daily.  Average BG is 329.   Although patient is not checking BG as much as recommended she is checking more than when I saw her in July 2015. Patient reports that it is difficult to remember to check BG because she is so busy with work (works at [employer] [facility] [employer]) and 2 children.  Average BG - unable to calculate because no BG readings Patient denies episodes of hypoglycemia in the last 2 weeks      Current Basal rates:     12am to 7am - 0.65      7am - 6pm - 1.10     6pm to midnight / 12am - 0.850     Insulin to CHO ratio to 10    insulin sensitivity to 50    Target BG 110  Exam Edema:  neg  Polyuria:  neg  Polydipsia:  neg Polyphagia:  neg  BMI:  Body mass index is 19.7 kg/(m^2).   Weight changes:  stable General Appearance:  alert, oriented, no acute distress and well nourished Mood/Affect:  normal   Low fat/carbohydrate diet?   improving Nicotine Abuse?  Yes -0.5 PPD, not interested in quitting currently Medication Compliance?  No - is not bolusing for  meals as recommended Exercise?  No Alcohol Abuse?  No   Lab Results  Component Value Date   HGBA1C 11.4 03/21/2014    No results found for this basenameConcepcion Elk: MICROALBUR,  [id number]    Lab Results  Component Value Date   CHOL 169 05/24/2011   HDL 32* 11/19/2013   LDLCALC 88 11/19/2013   LDLDIRECT 88 11/19/2013   TRIG 116* 11/19/2013   CHOLHDL 4.5* 11/19/2013      Assessment: 1.  Diabetes.  Type 1 uncontrolled - due to poor compliance with self BG monitoring and bolusing with insulin prior to meals 2.  Blood Pressure.  Controlled  Recommendations: 1.  Basal Rate changes  Current Basal rates:     12am to 7am - 0.65      7am - 6pm - 1.20     6pm to midnight / 12am - 0.950     Insulin to CHO ratio to 10    insulin sensitivity to 50    Target BG 110 2.  She is instructed to check BG at a minimum tid 3.  Reviewed HBG goals:  Fasting 80-130 and 1-2 hour post prandial <180.  4.  Reviewed s/s of hypoglycemia and hyperglycemia.  Patient is instructed to call office if BG is less than 70 or greater than 250 on more than 2 occassions in 1 week for pump adjustment.  5.  BP goal < 140/80. 6.  Asked about influenza vaccine - patient has not gotten yet but has plans to get at work since it is provided free there.  Also discussed Gardisil vaccine.  7. Will follow up in 4 weeks for further adjustment   Time spent counseling patient:  40 minutes    Henrene Pastorammy Tarvaris Puglia, PharmD, CPP.CDE

## 2014-08-02 ENCOUNTER — Ambulatory Visit (INDEPENDENT_AMBULATORY_CARE_PROVIDER_SITE_OTHER): Payer: Commercial Managed Care - PPO | Admitting: Family Medicine

## 2014-08-02 ENCOUNTER — Encounter: Payer: Self-pay | Admitting: Family Medicine

## 2014-08-02 VITALS — BP 106/70 | HR 79 | Temp 97.8°F | Ht 64.0 in | Wt 124.0 lb

## 2014-08-02 DIAGNOSIS — F32A Depression, unspecified: Secondary | ICD-10-CM

## 2014-08-02 DIAGNOSIS — F329 Major depressive disorder, single episode, unspecified: Secondary | ICD-10-CM

## 2014-08-02 MED ORDER — ALPRAZOLAM 0.25 MG PO TABS: 0.2500 mg | ORAL_TABLET | Freq: Two times a day (BID) | ORAL | Status: DC | PRN

## 2014-08-02 MED ORDER — SERTRALINE HCL 50 MG PO TABS: 50.0000 mg | ORAL_TABLET | Freq: Every day | ORAL | Status: DC

## 2014-08-02 NOTE — Progress Notes (Signed)
   Subjective:    Patient ID: [name], female    DOB: [birth year], 23 y.o.   MRN: [id number]  HPI Patient is here with c/o depression. She denies homicidal or suicidal ideation.  She c/o Sleep difficulties, decreased interests, Low energy, crying spells, anger spells,increased psychomotor movements, and feeling bad in general.  She states sx's have been ongoing for 2 months.  Review of Systems  Constitutional: Negative for fever.  HENT: Negative for ear pain.   Eyes: Negative for discharge.  Respiratory: Negative for cough.   Cardiovascular: Negative for chest pain.  Gastrointestinal: Negative for abdominal distention.  Endocrine: Negative for polyuria.  Genitourinary: Negative for difficulty urinating.  Musculoskeletal: Negative for gait problem and neck pain.  Skin: Negative for color change and rash.  Neurological: Negative for speech difficulty and headaches.  Psychiatric/Behavioral: Negative for agitation.       Objective:    BP 106/70  Pulse 79  Temp(Src) 97.8 F (36.6 C) (Oral)  Ht 5\' 4"  (1.626 m)  Wt 124 lb (56.246 kg)  BMI 21.27 kg/m2  LMP 08/02/2014  Breastfeeding? No Physical Exam  Constitutional: She is oriented to person, place, and time. She appears well-developed and well-nourished.  HENT:  Head: Normocephalic and atraumatic.  Mouth/Throat: Oropharynx is clear and moist.  Eyes: Pupils are equal, round, and reactive to light.  Neck: Normal range of motion. Neck supple.  Cardiovascular: Normal rate and regular rhythm.   No murmur heard. Pulmonary/Chest: Effort normal and breath sounds normal.  Abdominal: Soft. Bowel sounds are normal. There is no tenderness.  Neurological: She is alert and oriented to person, place, and time.  Skin: Skin is warm and dry.  Psychiatric: She has a normal mood and affect.          Assessment & Plan:     ICD-9-CM ICD-10-CM   1. Depression 311 F32.9 sertraline (ZOLOFT) 50 MG tablet     ALPRAZolam (XANAX) 0.25  MG tablet   Recommend follow up in one month and check labs then.  No Follow-up on file.  Deatra CanterWilliam J Omolara Carol FNP

## 2014-08-05 ENCOUNTER — Encounter: Payer: Self-pay | Admitting: Family Medicine

## 2014-09-02 ENCOUNTER — Ambulatory Visit: Payer: Self-pay

## 2014-09-10 ENCOUNTER — Encounter: Payer: Self-pay | Admitting: Family Medicine

## 2014-09-10 ENCOUNTER — Ambulatory Visit (INDEPENDENT_AMBULATORY_CARE_PROVIDER_SITE_OTHER): Payer: Commercial Managed Care - PPO | Admitting: Family Medicine

## 2014-09-10 VITALS — BP 102/63 | HR 90 | Temp 97.0°F | Ht 65.0 in | Wt 122.4 lb

## 2014-09-10 DIAGNOSIS — F329 Major depressive disorder, single episode, unspecified: Secondary | ICD-10-CM | POA: Diagnosis not present

## 2014-09-10 DIAGNOSIS — F32A Depression, unspecified: Secondary | ICD-10-CM

## 2014-09-10 MED ORDER — SERTRALINE HCL 50 MG PO TABS: 50.0000 mg | ORAL_TABLET | Freq: Every day | ORAL | Status: DC

## 2014-09-10 MED ORDER — ALPRAZOLAM 0.25 MG PO TABS: 0.2500 mg | ORAL_TABLET | Freq: Two times a day (BID) | ORAL | Status: DC | PRN

## 2014-09-10 NOTE — Progress Notes (Signed)
   Subjective:    Patient ID: Patricia DuttonKristine Fox, female    DOB: [birth year], 23 y.o.   MRN: [id number]  HPI Patient is here for follow up on her anxiety and depression.  She states she is doing a lot better.  Review of Systems  Constitutional: Negative for fever.  HENT: Negative for ear pain.   Eyes: Negative for discharge.  Respiratory: Negative for cough.   Cardiovascular: Negative for chest pain.  Gastrointestinal: Negative for abdominal distention.  Endocrine: Negative for polyuria.  Genitourinary: Negative for difficulty urinating.  Musculoskeletal: Negative for gait problem and neck pain.  Skin: Negative for color change and rash.  Neurological: Negative for speech difficulty and headaches.  Psychiatric/Behavioral: Negative for agitation.       Objective:    BP 102/63 mmHg  Pulse 90  Temp(Src) 97 F (36.1 C) (Oral)  Ht 5\' 5"  (1.651 m)  Wt 122 lb 6.4 oz (55.52 kg)  BMI 20.37 kg/m2 Physical Exam  Constitutional: She is oriented to person, place, and time. She appears well-developed and well-nourished.  HENT:  Head: Normocephalic and atraumatic.  Mouth/Throat: Oropharynx is clear and moist.  Eyes: Pupils are equal, round, and reactive to light.  Neck: Normal range of motion. Neck supple.  Cardiovascular: Normal rate and regular rhythm.   No murmur heard. Pulmonary/Chest: Effort normal and breath sounds normal.  Abdominal: Soft. Bowel sounds are normal. There is no tenderness.  Neurological: She is alert and oriented to person, place, and time.  Skin: Skin is warm and dry.  Psychiatric: She has a normal mood and affect.          Assessment & Plan:     ICD-9-CM ICD-10-CM   1. Depression 311 F32.9 sertraline (ZOLOFT) 50 MG tablet     ALPRAZolam (XANAX) 0.25 MG tablet     No Follow-up on file.  Deatra CanterWilliam J Kaylea Mounsey FNP

## 2014-11-14 ENCOUNTER — Encounter: Payer: Self-pay | Admitting: Nurse Practitioner

## 2014-11-14 ENCOUNTER — Ambulatory Visit (INDEPENDENT_AMBULATORY_CARE_PROVIDER_SITE_OTHER): Payer: Commercial Managed Care - PPO | Admitting: Nurse Practitioner

## 2014-11-14 VITALS — BP 104/72 | HR 94 | Temp 97.5°F | Ht 65.0 in | Wt 124.0 lb

## 2014-11-14 DIAGNOSIS — R300 Dysuria: Secondary | ICD-10-CM

## 2014-11-14 DIAGNOSIS — N3000 Acute cystitis without hematuria: Secondary | ICD-10-CM

## 2014-11-14 LAB — POCT URINALYSIS DIPSTICK
Bilirubin, UA: NEGATIVE
Glucose, UA: NEGATIVE
Nitrite, UA: NEGATIVE
PROTEIN UA: NEGATIVE
Spec Grav, UA: 1.015
UROBILINOGEN UA: NEGATIVE
pH, UA: 5

## 2014-11-14 LAB — POCT UA - MICROSCOPIC ONLY
Bacteria, U Microscopic: NEGATIVE
Casts, Ur, LPF, POC: NEGATIVE
Crystals, Ur, HPF, POC: NEGATIVE
Mucus, UA: NEGATIVE

## 2014-11-14 MED ORDER — SULFAMETHOXAZOLE-TRIMETHOPRIM 800-160 MG PO TABS: 1.0000 | ORAL_TABLET | Freq: Two times a day (BID) | ORAL | Status: DC

## 2014-11-14 NOTE — Patient Instructions (Signed)

## 2014-11-14 NOTE — Addendum Note (Signed)
Addended by: Prescott GumLAND, Levora Werden M on: 11/14/2014 02:47 PM   Modules accepted: Orders

## 2014-11-14 NOTE — Progress Notes (Signed)
  PCP:Bennie PieriniMARTIN,Patricia MARGARET, FNP Chief Complaint  Patient presents with  . Dysuria    Current Issues:  Presents with dysuria days of dysuria, urinary urgency and urinary frequency Associated symptoms include:  chills, cloudy urine, dysuria, urinary frequency and urinary urgency  There is no history of of similar symptoms. Sexually active:  Yes with female.   No concern for STI.  Prior to Admission medications   Medication Sig Start Date End Date Taking? Authorizing Provider  ALPRAZolam (XANAX) 0.25 MG tablet Take 1 tablet (0.25 mg total) by mouth 2 (two) times daily as needed for anxiety. 09/10/14  Yes Deatra CanterWilliam J Oxford, FNP  etonogestrel (NEXPLANON) 68 MG IMPL implant Inject 1 each into the skin once.   Yes Historical Provider, MD  insulin aspart (NOVOLOG) 100 UNIT/ML injection Use as directed in insulin pump - max daily dose is 55 units per day. 07/25/14  Yes Tammy Eckard, PHARMD  sertraline (ZOLOFT) 50 MG tablet Take 1 tablet (50 mg total) by mouth daily. 09/10/14  Yes Deatra CanterWilliam J Oxford, FNP    Review of Systems:no fever  PE:  BP 104/72 mmHg  Pulse 94  Temp(Src) 97.5 F (36.4 C) (Oral)  Ht 5\' 5"  (1.651 m)  Wt 124 lb (56.246 kg)  BMI 20.63 kg/m2 Constitutional- no acute distress Heart-RRR Lungs-Clear in all fields Back- NO CVA tenderness Abdomen- no abd pain or tnederness Pelvic- no pain     Assessment and Plan:  1. Dysuria 2. UTI 3. Glucosurea Take medication as prescribe Cotton underwear Take shower not bath Cranberry juice, yogurt Force fluids AZO over the counter X2 days Culture pending RTO in 1 week to check diabetes  Patricia-Margaret Daphine DeutscherMartin, FNP   - POCT UA - Microscopic Only - POCT urinalysis dipstick

## 2014-11-16 LAB — URINE CULTURE

## 2014-11-19 ENCOUNTER — Telehealth: Payer: Self-pay | Admitting: Nurse Practitioner

## 2014-11-19 LAB — HIV-1 AB, EXTERNAL
HIV, EXTERNAL: NEGATIVE
HIV, External: NEGATIVE

## 2014-11-19 LAB — TYPE, ABO & RH, EXTERNAL

## 2014-11-19 LAB — HEPATITIS B SURFACE AG, EXTERNAL: HBsAg, External: NEGATIVE

## 2014-11-19 LAB — RUBELLA AB, IGG, EXTERNAL: Rubella, External: IMMUNE

## 2014-11-19 LAB — CHLAMYDIA DNA PROBE, EXTERNAL: Chlamydia, External: NEGATIVE

## 2014-11-19 LAB — ANTIBODY SCREEN, EXTERNAL: Antibody screen, External: NEGATIVE

## 2014-11-19 LAB — RPR, EXTERNAL: RPR, External: NEGATIVE

## 2014-11-19 LAB — N GONORRHOEAE, DNA PROBE, EXTERNAL: Gonorrhea, External: NEGATIVE

## 2014-11-19 MED ORDER — INSULIN LISPRO 100 UNIT/ML ~~LOC~~ SOLN: SUBCUTANEOUS | Status: DC

## 2014-11-19 NOTE — Telephone Encounter (Signed)
Pt aware rx sent.  

## 2014-11-19 NOTE — Telephone Encounter (Signed)
Insulin rx changed to humalog

## 2014-11-21 ENCOUNTER — Ambulatory Visit: Payer: Commercial Managed Care - PPO | Admitting: Nurse Practitioner

## 2014-11-26 ENCOUNTER — Ambulatory Visit: Payer: Commercial Managed Care - PPO | Admitting: Nurse Practitioner

## 2015-03-26 LAB — GYN RAPID GP B STREP, EXTERNAL: GrBStrep, External: NEGATIVE

## 2015-04-28 NOTE — H&P (Signed)
[facility]  Pre-Admit History and Physical  NAME:  Lisa, [name]  SEX:   F  ADMIT:   DOB:10/01/1991  MR#    [id number]  ROOM:    ACCT#  [id number]    I hereby certify this patient for admission based upon medical necessity as   noted below:    <    CHIEF COMPLAINT:  The patient being admitted for induction of labor.    HISTORY OF PRESENT ILLNESS:  This is a 24 year old gravida 1 female whose EDC is on 08/02 being admitted   for induction of labor.  She denies any fever, no headaches, no blurry vision,   no chest pains or shortness of breath.    PAST MEDICAL HISTORY:  No significant medical illness in the past.    ALLERGIES:  NO KNOWN DRUG ALLERGIES.    PHYSICAL EXAMINATION:  HEENT:  Unremarkable.  NECK:  Supple, no masses, no thyromegaly.  LUNGS:  Clear.  HEART:  Regular rhythm.  ABDOMEN:  Gravid, soft uterus with normal fetal heart tones.  PELVIC:  She has vulvar varicosities, no other lesions.  Cervix 1 cm dilated,   70% effaced, -2 station.  LYMPHATICS:  No lymphadenopathy.  NEUROLOGIC:  No neurologic deficit.    IMPRESSION:  Term pregnancy for induction of labor.      ___________________  Rennis Harding MD  Dictated By: .   LH  D:04/28/2015 11:07:22  T: 04/28/2015 11:31:03  [id number]

## 2015-05-03 ENCOUNTER — Encounter: Primary: Pediatrics

## 2015-05-04 ENCOUNTER — Inpatient Hospital Stay
Admit: 2015-05-04 | Discharge: 2015-05-06 | Disposition: A | Discharge status: Home / Self Care | Payer: PRIVATE HEALTH INSURANCE | Attending: Specialist | Admitting: Specialist

## 2015-05-04 ENCOUNTER — Encounter: Primary: Pediatrics

## 2015-05-04 LAB — BLOOD TYPE, (ABO+RH)
ABO/Rh(D): O POS
ABO/Rh: O POS

## 2015-05-04 LAB — CBC WITH AUTOMATED DIFF
BASOPHILS: 0.5 % (ref 0–3)
EOSINOPHILS: 0.5 % (ref 0–5)
HCT: 33.6 % — ABNORMAL LOW (ref 37.0–50.0)
HGB: 11.4 gm/dl — ABNORMAL LOW (ref 13.0–17.2)
IMMATURE GRANULOCYTES: 0.9 % (ref 0.0–3.0)
LYMPHOCYTES: 22.5 % — ABNORMAL LOW (ref 28–48)
MCH: 31.8 pg (ref 25.4–34.6)
MCHC: 33.9 gm/dl (ref 30.0–36.0)
MCV: 93.6 fL (ref 80.0–98.0)
MONOCYTES: 10 % (ref 1–13)
MPV: 9.9 fL (ref 6.0–10.0)
NEUTROPHILS: 65.6 % — ABNORMAL HIGH (ref 34–64)
NRBC: 0 (ref 0–0)
PLATELET: 160 10*3/uL (ref 140–450)
RBC: 3.59 M/uL — ABNORMAL LOW (ref 3.60–5.20)
RDW-SD: 44.1 (ref 36.4–46.3)
WBC: 8 10*3/uL (ref 4.0–11.0)

## 2015-05-04 LAB — ANTIBODY SCREEN: Antibody screen: NEGATIVE

## 2015-05-04 MED ORDER — NALBUPHINE 10 MG/ML INJECTION
10 mg/mL | INTRAMUSCULAR | Status: DC | PRN
Start: 2015-05-04 — End: 2015-05-04

## 2015-05-04 MED ORDER — SODIUM CHLORIDE 0.9 % IJ SYRG
INTRAMUSCULAR | Status: DC | PRN
Start: 2015-05-04 — End: 2015-05-04

## 2015-05-04 MED ORDER — LACTATED RINGERS IV
INTRAVENOUS | Status: DC
Start: 2015-05-04 — End: 2015-05-04
  Administered 2015-05-04 (×2): via INTRAVENOUS

## 2015-05-04 MED ORDER — NALOXONE 0.4 MG/ML INJECTION
0.4 mg/mL | INTRAMUSCULAR | Status: DC | PRN
Start: 2015-05-04 — End: 2015-05-04

## 2015-05-04 MED ORDER — OXYTOCIN 20 UNITS/1000 ML IN NS
20 unit/1000 mL | INTRAVENOUS | Status: DC
Start: 2015-05-04 — End: 2015-05-04
  Administered 2015-05-04 – 2015-05-05 (×13): via INTRAVENOUS

## 2015-05-04 MED ORDER — BUTORPHANOL TARTRATE 2 MG/ML IJ SOLN
2 mg/mL | INTRAMUSCULAR | Status: DC | PRN
Start: 2015-05-04 — End: 2015-05-04

## 2015-05-04 MED ORDER — LIDOCAINE-EPINEPHRINE 1.5 %-1:200,000 IJ SOLN
1.5 %-1:200,000 | INTRAMUSCULAR | Status: DC | PRN
Start: 2015-05-04 — End: 2015-05-04
  Administered 2015-05-04 (×2): via EPIDURAL

## 2015-05-04 MED ORDER — ONDANSETRON (PF) 4 MG/2 ML INJECTION
4 mg/2 mL | INTRAMUSCULAR | Status: DC | PRN
Start: 2015-05-04 — End: 2015-05-04

## 2015-05-04 MED ORDER — FENTANYL-BUPIVACAINE IN NS (PF) 2 MCG/ML-0.1 % EPIDURAL
INTRAMUSCULAR | Status: AC
Start: 2015-05-04 — End: 2015-05-05

## 2015-05-04 MED FILL — LACTATED RINGERS IV: INTRAVENOUS | Qty: 1000

## 2015-05-04 MED FILL — OXYTOCIN 20 UNITS/1000 ML IN NS: 20 unit/1000 mL | INTRAVENOUS | Qty: 1000

## 2015-05-04 MED FILL — FENTANYL-BUPIVACAINE IN NS (PF) 2 MCG/ML-0.1 % EPIDURAL: INTRAMUSCULAR | Qty: 100

## 2015-05-04 NOTE — Other (Signed)
Report given to J. Schmitt RN

## 2015-05-04 NOTE — Other (Signed)
1200 cc soap suds enema given. Good results per pt.

## 2015-05-04 NOTE — Anesthesia Pre-Procedure Evaluation (Signed)
Anesthetic History   No history of anesthetic complications            Review of Systems / Medical History  Patient summary reviewed, nursing notes reviewed and pertinent labs reviewed    Pulmonary  Within defined limits                 Neuro/Psych   Within defined limits           Cardiovascular  Within defined limits                Exercise tolerance: >4 METS     GI/Hepatic/Renal  Within defined limits              Endo/Other  Within defined limits           Other Findings              Physical Exam    Airway  Mallampati: II  TM Distance: 4 - 6 cm  Neck ROM: normal range of motion   Mouth opening: Normal     Cardiovascular  Regular rate and rhythm,  S1 and S2 normal,  no murmur, click, rub, or gallop  Rhythm: regular  Rate: normal         Dental  No notable dental hx       Pulmonary  Breath sounds clear to auscultation               Abdominal  GI exam deferred       Other Findings            Anesthetic Plan    ASA: 2  Anesthesia type: epidural            Anesthetic plan and risks discussed with: Patient

## 2015-05-04 NOTE — Anesthesia Procedure Notes (Signed)
Epidural Block    Start time: 05/04/2015 10:24 AM  End time: 05/04/2015 10:40 AM  Reason for block: labor epidural  Staffing  Anesthesiologist: Tylena Prisk LOUIS  Performed by: anesthesiologist   Prep    Patient was placed in seated position  The back was prepped at the lumbar region  Prep Solution(s): chlorhexidine  Epidural  Epidural Location: L3-4  Needle: 18G Tuohy  Attempts: needle passed 1 time(s) with loss of resistance using saline  Catheter: 20 G epidural catheter placed/secured  Catheter at skin depth: approximately 5cm  Catheter into epidural space: approximately 10cm  No blood with aspiration, no cerebrospinal fluid with aspiration, no paresthesia and negative aspiration test  Test dose: lidocaine 1.5% w/ epi, negative  Assessment  Catheter was secured to back with tegaderm and tape  Insertion was uncomplicated and patient tolerated without any apparent complications

## 2015-05-04 NOTE — Other (Signed)
REPORT GIVEN TO S.SMITH RN.

## 2015-05-04 NOTE — Other (Signed)
Report called to Fleet Contras on MBU on all maternal and baby data.

## 2015-05-04 NOTE — Other (Signed)
To room 4103 via w/c accompanied by family members.

## 2015-05-04 NOTE — Other (Signed)
Pt to bathroom with assistance. Right leg heavy but able to bear weight. Voided large amount. Peri care given. Ice pack, pad and gown changed.

## 2015-05-04 NOTE — Other (Addendum)
----------  DocumentID:   [id number]------------------------------------------------              [facility]                       Patient Education Report         Name: KRISHIKA, BUGGE                  Date: 05/04/2015    MRN: [id number]                    Time: 6:56:30 AM         Patient ordered video: Patient Safety: Stay Safe While you are in the   Hospital    from [id number]_[id number] via phone number: [phone] at 6:56:30 AM    ----------DocumentID:   [id number]------------------------------------------------              [facility]                       Patient Education Report         Name: TAITE, BALDASSARI                  Date: 05/04/2015    MRN: [id number]                    Time: 9:44:59 PM         Patient ordered video: Safe Sleep Practices    from [id number] via phone number: [phone] at 9:44:59 PM

## 2015-05-04 NOTE — Other (Signed)
Lab work drawn by lab.

## 2015-05-04 NOTE — Other (Signed)
Assumed care of pt beginning to push

## 2015-05-04 NOTE — Anesthesia Post-Procedure Evaluation (Signed)
Post-Anesthesia Evaluation and Assessment    Patient: Lisa [name] MRN: [id number]  SSN: [id number]    Date of Birth: [birth year]  Age: 24 y.o.  Sex: female       Cardiovascular Function/Vital Signs  Visit Vitals   Item Reading   ??? BP 117/66 mmHg   ??? Pulse 81   ??? Temp 37.1 ??C (98.7 ??F)   ??? Resp 18   ??? Ht 5\' 11"  (1.803 m)   ??? Wt 75.751 kg (167 lb)   ??? BMI 23.30 kg/m2   ??? SpO2 99%   ??? Breastfeeding Unknown       Patient is status post epidural anesthesia for * No procedures listed *.    Nausea/Vomiting: None    Postoperative hydration reviewed and adequate.    Pain:  Pain Scale 1: Numeric (0 - 10) (05/04/15 2015)  Pain Intensity 1: 6 (05/04/15 2015)   Managed    Neurological Status:   Neuro (WDL): Within Defined Limits (05/04/15 1930)   Block resolving    Mental Status and Level of Consciousness: Alert and oriented     Pulmonary Status:       Adequate oxygenation and airway patent    Complications related to anesthesia: None    Post-anesthesia assessment completed. No concerns      Signed By: Quitman LivingsOBERT Gwendalyn Mcgonagle, MD     May 04, 2015

## 2015-05-04 NOTE — Other (Signed)
While pt breasteeding baby pt states baby is blue. Baby taken to warmer and given tactile stim and o 2 at 5 lpm, hr 160, with spontaneous respirations and color pink.

## 2015-05-04 NOTE — Other (Signed)
Arrival of this 24 yo g 1 p 0 at 39.[redacted] weeks gestation for scheduled induction of labor by Dr Birder Robson. Pt presents smiling and in good spirits. ID verified and family to waiting area during assessment. Allergic to asa- band applied. Pt denies any problems with pregnancy except vulvar varicosities.

## 2015-05-04 NOTE — Other (Signed)
svd of vfi by dr Birder Robsonperwaiz with apgars 9-9 see delivery summary

## 2015-05-04 NOTE — Other (Signed)
TRANSFER - IN REPORT:    Verbal report received from Sonja,RN(name) on Lisa [name]  being received from L&D(unit) for routine progression of care      Report consisted of patient???s Situation, Background, Assessment and   Recommendations(SBAR).     Information from the following report(s) SBAR, Intake/Output, MAR and Recent Results was reviewed with the receiving nurse.    Opportunity for questions and clarification was provided.      Assessment completed upon patient???s arrival to unit and care assumed.

## 2015-05-04 NOTE — Anesthesia Procedure Notes (Signed)
Epidural Block    Start time: 05/04/2015 10:24 AM  End time: 05/04/2015 10:40 AM  Reason for block: labor epidural  Staffing  Anesthesiologist: SMITS, Bosco Paparella LOUIS  Performed by: anesthesiologist   Prep    Patient was placed in seated position  The back was prepped at the lumbar region  Prep Solution(s): chlorhexidine  Epidural  Epidural Location: L3-4  Needle: 18G Tuohy  Attempts: needle passed 1 time(s) with loss of resistance using saline  Catheter: 20 G epidural catheter placed/secured  Catheter at skin depth: approximately 5cm  Catheter into epidural space: approximately 10cm  No blood with aspiration, no cerebrospinal fluid with aspiration, no paresthesia and negative aspiration test  Test dose: lidocaine 1.5% w/ epi, negative  Assessment  Catheter was secured to back with tegaderm and tape  Insertion was uncomplicated and patient tolerated without any apparent complications

## 2015-05-05 MED ORDER — GLYCERIN-WITCH HAZEL 12.5 %-50 % TOPICAL PADS
CUTANEOUS | Status: DC | PRN
Start: 2015-05-05 — End: 2015-05-06

## 2015-05-05 MED ORDER — ACETAMINOPHEN 325 MG TABLET
325 mg | ORAL | Status: DC | PRN
Start: 2015-05-05 — End: 2015-05-06

## 2015-05-05 MED ORDER — OXYTOCIN 20 UNITS/1000 ML IN NS
20 unit/1000 mL | INTRAVENOUS | Status: DC
Start: 2015-05-05 — End: 2015-05-05

## 2015-05-05 MED ORDER — IBUPROFEN 600 MG TAB
600 mg | Freq: Four times a day (QID) | ORAL | Status: DC | PRN
Start: 2015-05-05 — End: 2015-05-06
  Administered 2015-05-05 – 2015-05-06 (×6): via ORAL

## 2015-05-05 MED ORDER — OXYCODONE-ACETAMINOPHEN 5 MG-325 MG TAB
5-325 mg | ORAL | Status: DC | PRN
Start: 2015-05-05 — End: 2015-05-06
  Administered 2015-05-05 – 2015-05-06 (×6): via ORAL

## 2015-05-05 MED ORDER — DIPHTH,PERTUS(ACEL)TETANUS VAC(PF) 2 LF-(5-3-5MCG)-5 LF/0.5 ML IM SUSP
2 Lf-(.5-5-3-5 mcg)-5Lf/0.5 mL | INTRAMUSCULAR | Status: AC
Start: 2015-05-05 — End: 2015-05-05
  Administered 2015-05-06: 03:00:00 via INTRAMUSCULAR

## 2015-05-05 MED ORDER — ALUM-MAG HYDROXIDE-SIMETH 200 MG-200 MG-20 MG/5 ML ORAL SUSP
200-200-20 mg/5 mL | ORAL | Status: DC | PRN
Start: 2015-05-05 — End: 2015-05-06

## 2015-05-05 MED ORDER — RHO D IMMUNE GLOBULIN 300 MCG IM SYRG
1500 unit (300 mcg) | Freq: Once | INTRAMUSCULAR | Status: DC | PRN
Start: 2015-05-05 — End: 2015-05-06

## 2015-05-05 MED ORDER — SIMETHICONE 80 MG CHEWABLE TAB
80 mg | Freq: Three times a day (TID) | ORAL | Status: DC | PRN
Start: 2015-05-05 — End: 2015-05-06

## 2015-05-05 MED ORDER — SENNOSIDES 8.6 MG TAB
8.6 mg | Freq: Every evening | ORAL | Status: DC
Start: 2015-05-05 — End: 2015-05-06
  Administered 2015-05-05: 03:00:00 via ORAL

## 2015-05-05 MED ORDER — BENZOCAINE 20 % TOPICAL AEROSOL
20 % | CUTANEOUS | Status: DC | PRN
Start: 2015-05-05 — End: 2015-05-06
  Administered 2015-05-05 – 2015-05-06 (×2): via TOPICAL

## 2015-05-05 MED ORDER — ONDANSETRON (PF) 4 MG/2 ML INJECTION
4 mg/2 mL | Freq: Four times a day (QID) | INTRAMUSCULAR | Status: DC | PRN
Start: 2015-05-05 — End: 2015-05-06

## 2015-05-05 MED ORDER — DIPHENHYDRAMINE 25 MG CAP
25 mg | Freq: Four times a day (QID) | ORAL | Status: DC | PRN
Start: 2015-05-05 — End: 2015-05-06

## 2015-05-05 MED FILL — IBUPROFEN 600 MG TAB: 600 mg | ORAL | Qty: 1

## 2015-05-05 MED FILL — SENNA 8.6 MG TABLET: 8.6 mg | ORAL | Qty: 2

## 2015-05-05 MED FILL — OXYCODONE-ACETAMINOPHEN 5 MG-325 MG TAB: 5-325 mg | ORAL | Qty: 1

## 2015-05-05 MED FILL — OXYTOCIN 20 UNITS/1000 ML IN NS: 20 unit/1000 mL | INTRAVENOUS | Qty: 1000

## 2015-05-05 MED FILL — AMERICAINE 20 % TOPICAL AEROSOL: 20 % | CUTANEOUS | Qty: 1

## 2015-05-05 MED FILL — A.E.R. WITCH HAZEL 12.5 %-50 % TOPICAL PADS: CUTANEOUS | Qty: 1

## 2015-05-05 MED FILL — XYLOCAINE-MPF/EPINEPHRINE 1.5 %-1:200,000 INJECTION SOLUTION: 1.5 %-1:200,000 | INTRAMUSCULAR | Qty: 5

## 2015-05-05 NOTE — Other (Signed)
Bedside and Verbal shift change report given to MARY ANNE SCHULTZ, RN   (oncoming nurse) by R Hess RN (offgoing nurse). Report included the following information Kardex.

## 2015-05-05 NOTE — Procedures (Signed)
[facility]  Procedure Note  NAME:  [name], Lisa  SEX:   F  DATE: 05/04/2015  DOB: 11/01/1990  MR#    [id number]  ROOM:  4103  ACCT#  [id number]        PROCEDURE NOTE:  Spontaneous uncomplicated vaginal delivery over a midline episiotomy of female   infant with Apgar score 8 and 9.  She had epidural anesthesia.  No nursery   personnel needed in the delivery room.  No cord blood pH obtained.  Estimated   blood loss 300 mL.  The episiotomy repaired with 2-0 chromic sutures.  Uterus   was firm at the end of the placental spontaneous delivery.  The patient for   routine postpartum care.      ___________________  Lilliauna Van A Quintavius Niebuhr MD  Dictated By:.   SB  D:05/05/2015 11:01:22  T: 05/05/2015 11:27:27  [id number]

## 2015-05-05 NOTE — Other (Signed)
Bedside and Verbal shift change report given to Lesly DukesLisa Bidanset, RN (oncoming nurse) by Abel PrestoMary Ann Schultz, RN (offgoing nurse). Report included the following information SBAR, MAR and Recent Results.

## 2015-05-05 NOTE — Progress Notes (Signed)
Vaginal Delivery Day Number 1 Progress Note    Patient: Lisa [name] MRN: [id number]  SSN: [id number]    Date of Birth: Mar 18, 1991  Age: 24 y.o.  Sex: female      Patient doing well post-partum without significant complaint.  Voiding without difficulty, normal lochia.    Vitals:  Patient Vitals for the past 8 hrs:   BP Temp Pulse Resp SpO2   05/05/15 0604 107/51 mmHg 98.2 ??F (36.8 ??C) 65 14 100 %     Temp (24hrs), Avg:98.5 ??F (36.9 ??C), Min:97.9 ??F (36.6 ??C), Max:98.9 ??F (37.2 ??C)      Vital signs stable, afebrile.      Exam:  Patient without distress.              Extremities are negative for localized tenderness.              Patient ambulating without assistance.    Assessment and Plan:  Patient appears to be having uncomplicated post-partum course.  Continue routine perineal care and maternal education.  Plan discharge tomorrow if no problems occur.      Rennis Harding, MD  05/05/2015  10:11 AM

## 2015-05-05 NOTE — Procedures (Signed)
[facility]  [facility]  NAME:  Lisa [name], Lisa [name]  SEX:   F  DATE: 05/04/2015  DOB: 1991/01/29  MR#    [id number]  ROOM:  4103  ACCT#  [id number]        PROCEDURE NOTE:  Spontaneous uncomplicated vaginal delivery over a midline episiotomy of female   infant with Apgar score 8 and 9.  She had epidural anesthesia.  No nursery   personnel needed in the delivery room.  No cord blood pH obtained.  Estimated   blood loss 300 mL.  The episiotomy repaired with 2-0 chromic sutures.  Uterus   was firm at the end of the placental spontaneous delivery.  The patient for   routine postpartum care.      ___________________  Rennis HardingJavaid A Anamari Galeas MD  Dictated By:.   SB  D:05/05/2015 11:01:22  T: 05/05/2015 11:27:27  [id number]

## 2015-05-06 MED FILL — OXYCODONE-ACETAMINOPHEN 5 MG-325 MG TAB: 5-325 mg | ORAL | Qty: 1

## 2015-05-06 MED FILL — IBUPROFEN 600 MG TAB: 600 mg | ORAL | Qty: 1

## 2015-05-06 MED FILL — ADACEL (TDAP ADOLESN/ADULT)(PF)2LF-(2.5-5-3-5MCG)-5 LF/0.5 ML IM SUSP: 2 Lf-(.5-5-3-5 mcg)-5Lf/0.5 mL | INTRAMUSCULAR | Qty: 0.5

## 2015-05-06 MED FILL — AMERICAINE 20 % TOPICAL AEROSOL: 20 % | CUTANEOUS | Qty: 1

## 2015-05-06 NOTE — Other (Signed)
Discharged to feeding room 1 in stable condition.

## 2015-05-06 NOTE — Lactation Note (Signed)
This note was copied from the chart of Lisa [name].  S- Initial Visit     B- G1/1 post SVD infant remains in SCN due to cyanotic episode while breastfeeding.     A- Mother in room states she returns to nursery to breastfeed infant every 3 hours. Infant now on ALTE watch day 2 of 5. Mother discharged from [facility]. Encouraged mom to begin pumping for 10 minutes after breastfeeding to store extra milk should mother and infant become separated.  Mother reports infant has been sleepy at the breast due to IVF. Nursery staff turned IVF down to assist. Mother reports next feeding at 1200 will attempt to follow up with mom.     R- Continue to offer breastfeeding 8-12 times a day in 24 hours   Pump 10 minutes after breastfeeding to build extra supply.   Continue to track feedings and diapers   Frequent skin to skin as SCN permits   Will continue to follow up as needed for support and assistance.

## 2015-05-06 NOTE — Progress Notes (Signed)
Post-Partum Day Number 2 Progress/Discharge Note    Patient: Lisa [name] MRN: [id number]  SSN: [id number]    Date of Birth: [birth year]  Age: 24 y.o.  Sex: female      Patient doing well post-partum without significant complaint.  Voiding without difficulty, normal lochia, positive flatus.    Vitals:  Patient Vitals for the past 8 hrs:   BP Temp Pulse Resp SpO2   05/06/15 0547 100/57 mmHg 98.1 ??F (36.7 ??C) 62 17 100 %     Temp (24hrs), Avg:97.9 ??F (36.6 ??C), Min:97.7 ??F (36.5 ??C), Max:98.1 ??F (36.7 ??C)      Vital signs stable, afebrile.    Exam:  Patient without distress.              Extremities are negative for localized tenderness.              Patient ambulating without assistance.    Assessment and Plan:  Patient appears to be having uncomplicated post-partum course.  Continue routine perineal care and maternal education. Discharge today. Call office at [phone] for follow-appointment.    Rennis Harding, MD  05/06/2015  11:40 AM

## 2015-05-06 NOTE — Other (Signed)
Bedside and Verbal shift change report given to [facility], RN   (oncoming nurse) by L. Bidanset RN Physiological scientist). Report included the following information Kardex.

## 2015-05-07 NOTE — Lactation Note (Signed)
This note was copied from the chart of Baby Girl A Muellner.  S: Follow up    B: G1P1 vag  GA 39 baby in SCN for ALTE  Day 3 of 5 watch after cyanotic episode after breastfeeding in L&D, maternal history of nil    A:  Baby in opencrib in mother's arms  Mother states pumping going well, pumping 70ml   Reviewed hands on pumping and hand expression  Reviewed patterns of baby in SCN and expectations  Mother breastfeeding baby, wide gape lips flanged, discomfort denied swallows heard, reviewed bringing baby closer to breast if begins to click    R:  Continue pumping at least 8 times per 24 hr period, encourage hands on pumping including breast compressions with pumping and hand expression afterwards to increase yield.  Bring expressed milk with label and date and time to SCN  Frequent skin to skin as condition allows and visits to SCN  Follow up to monitor progress and assist with latch as needed

## 2015-05-09 NOTE — Lactation Note (Signed)
This note was copied from the chart of Baby Girl A Dworkin.  S: Follow up    B: G1P1 vag  GA 39 baby in SCN for ALTE watch day 5 of 5 after cyanotic episode in L&D after breastfeeding, maternal history of nil    A:  Baby in opencrib mother's arms  Mother states pumping going well, "can't go without pumping session, I've told my breasts I have twins" reviewed process of weaning down pumping as baby takes breast well  Reviewed hands on pumping and hand expression  Reviewed patterns of baby in SCN and expectations of discharge  Mother's breasts full, reviewed engorgment care- mother states baby sleeps after 10 min on one side only.  Sucking blister observed on top lip, when mother latching baby, gape slightly narrow and baby curls lip in.  LC assist with breast shaping and baby achieved deeper latch with wider gape, LC needed to assist with lip flanging, mother denied discomfort with all feeds.  Observed attachement of labial frenulum to be on lower portion of top gum.  Continue to monitor.  Notified RN Dewayne Hatch of findings.    R:  Continue pumping at least 8 times per 24 hr period, encourage hands on pumping including breast compressions with pumping and hand expression afterwards to increase yield.  Bring expressed milk with label and date and time to SCN  Frequent skin to skin as condition allows and visits to SCN  Follow up to monitor progress and assist with latch as needed

## 2015-05-10 NOTE — Lactation Note (Signed)
This note was copied from the chart of Lisa [name].  S: Discharge Note     B: G1P1 vag?? GA 39 Lisa in SCN for ALTE watch day 6 of 5 after cyanotic episode in L&D after breastfeeding, maternal history of nil    A:?? Mother and infant in Over night room in preporation for discharge. Mother reports breastfeeding is going well. Infant continues to breastfeed over night with no pain or discomfort with feeding. Reports pumping is going well. No questions at time of assessment. Reviewed out patient resources with mom.?? Previous LC assist with breast shaping and Lisa achieved deeper latch with wider gape and ?? Observed attachement of labial frenulum to be on lower portion of top gum.??     R:  Continue pumping at least 8 times per 24 hr period, encourage hands on pumping including breast compressions with pumping and hand expression afterwards to increase yield.??   Store expressed milk with  date and time.  Reviewed out patient resources with mom   Frequent skin to skin  Will follow up with out patient phone call.

## 2015-06-04 ENCOUNTER — Ambulatory Visit: Payer: Commercial Managed Care - PPO | Admitting: Pharmacist

## 2015-07-03 ENCOUNTER — Telehealth: Payer: Self-pay | Admitting: Pharmacist

## 2015-07-03 NOTE — Telephone Encounter (Signed)
appt made

## 2015-07-03 NOTE — Telephone Encounter (Signed)
Received communication from [employer] for patient to receive a new insulin pump and supplies She was not been seen in almost 1 year. Left message at patient's home that appt is needed.

## 2015-07-07 ENCOUNTER — Encounter: Payer: Self-pay | Admitting: Pharmacist

## 2015-07-07 ENCOUNTER — Ambulatory Visit (INDEPENDENT_AMBULATORY_CARE_PROVIDER_SITE_OTHER): Payer: Commercial Managed Care - PPO | Admitting: Pharmacist

## 2015-07-07 VITALS — BP 82/62 | HR 102 | Ht 65.0 in | Wt 121.0 lb

## 2015-07-07 DIAGNOSIS — Z23 Encounter for immunization: Secondary | ICD-10-CM | POA: Diagnosis not present

## 2015-07-07 DIAGNOSIS — E1065 Type 1 diabetes mellitus with hyperglycemia: Secondary | ICD-10-CM

## 2015-07-07 LAB — POCT GLYCOSYLATED HEMOGLOBIN (HGB A1C): Hemoglobin A1C: 13.3

## 2015-07-07 NOTE — Progress Notes (Signed)
Patient ID: [name], female   DOB: 09-26-1991, 24 y.o.   MRN: [id number] Diabetes Follow-Up Visit Chief Complaint:   Chief Complaint  Patient presents with  . Diabetes     Filed Vitals:   07/07/15 1435  BP: 82/62  Pulse: 102   Filed Weights   07/07/15 1435  Weight: 121 lb (54.885 kg)    HPI:  [name] is a 24 yo woman here for adjustment of insulin pump.  She was last seen by myself 07/2014. Several appointment have been missed and rescheduled.   [name] was diagnosed with type 1 DM in 2005 when she was 24yo and has a history of poor compliance with follow up.   She was started on an insulin pump 04/2012. She has a Scientist, forensic.  No CGM, though we have tried to get CGM but her insurance requires records that she is checking BG at least 4 times daily.  [name] has not been checking BG at all.  I downloaded pump information / report and there were not BG readings and only 2 bolus events in the last 2 weeks.   She uses Novolog insulin in pump.   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  Average BG - unable to calculate because no BG readings Patient denies episodes of hypoglycemia in the last 2 weeks      Current Basal rates:     12am to 7am - 0.65      7am - 6pm - 1.20     6pm to midnight / 12am - 0.950     Insulin to CHO ratio to 10    insulin sensitivity to 50    Target BG 110  Exam Edema:  neg  Polyuria:  neg  Polydipsia:  neg Polyphagia:  neg  BMI:  Body mass index is 20.14 kg/(m^2).   Weight changes:  stable General Appearance:  alert, oriented, no acute distress and well nourished Mood/Affect:  normal   Low fat/carbohydrate diet?   improving Nicotine Abuse?  Yes -0.5 PPD, not interested in quitting currently Medication Compliance?  No - is not bolusing for meals as recommended Exercise?   No Alcohol Abuse?  No   Lab Results  Component Value Date   HGBA1C 13.3 07/07/2015    No results found for: [facility]  Lab Results  Component Value Date   CHOL 143 11/19/2013   HDL 32* 11/19/2013   LDLCALC 88 11/19/2013   LDLDIRECT 88 11/19/2013   TRIG 116* 11/19/2013   CHOLHDL 4.5* 11/19/2013      Assessment: 1.  Diabetes.  Type 1 uncontrolled - due to poor compliance with self BG monitoring and bolusing with insulin prior to meals 2.  Blood Pressure.  Controlled  Recommendations: 1.  No changes in pump settings today 2.  She is instructed to check BG at a minimum tid - discussed why it is important to check BG regularly.  Also discussed changing infusion get every 3 days (current changing about every 5 to 6 days).  Reviewed long term complications of uncontrolled DM> 3.  Reviewed HBG goals:  Fasting 80-130 and 1-2 hour post prandial <180.      4.  Reviewed s/s of hypoglycemia and hyperglycemia.  Patient is instructed to call office if BG is less than 70 or greater than 250 on more than 2 occassions  in 1 week for pump adjustment.  5.  BP goal < 140/80. 6.  Received influenza vaccine in office today 7. Will follow up in 4 weeks for further adjustment 8.  Rx sent for pump supplies and also requested checking again to see if CGM will be covered by patient's insurance as this technology could help improve compliance with BG monitoring and bolusing.   Time spent counseling patient:  35 minutes    Henrene Pastor, PharmD, CPP.CDE

## 2015-07-07 NOTE — Patient Instructions (Signed)
Diabetes and Standards of Medical Care   Diabetes is complicated. You may find that your diabetes team includes a dietitian, nurse, diabetes educator, eye doctor, and more. To help everyone know what is going on and to help you get the care you deserve, the following schedule of care was developed to help keep you on track. Below are the tests, exams, vaccines, medicines, education, and plans you will need.  Blood Glucose Goals Prior to meals = 80 - 130 Within 2 hours of the start of a meal = less than 180  HbA1c test (goal is less than 7.0% - your last value was 13.3%) This test shows how well you have controlled your glucose over the past 2 to 3 months. It is used to see if your diabetes management plan needs to be adjusted.   It is performed at least 2 times a year if you are meeting treatment goals.  It is performed 4 times a year if therapy has changed or if you are not meeting treatment goals.  Blood pressure test  This test is performed at every routine medical visit. The goal is less than 140/90 mmHg for most people, but 130/80 mmHg in some cases. Ask your health care provider about your goal.  Dental exam  Follow up with the dentist regularly.  Eye exam  If you are diagnosed with type 1 diabetes as a child, get an exam upon reaching the age of 10 years or older and have had diabetes for 3 to 5 years. Yearly eye exams are recommended after that initial eye exam.  If you are diagnosed with type 1 diabetes as an adult, get an exam within 5 years of diagnosis and then yearly.  If you are diagnosed with type 2 diabetes, get an exam as soon as possible after the diagnosis and then yearly.  Foot care exam  Visual foot exams are performed at every routine medical visit. The exams check for cuts, injuries, or other problems with the feet.  A comprehensive foot exam should be done yearly. This includes visual inspection as well as assessing foot pulses and testing for loss of  sensation.  Check your feet nightly for cuts, injuries, or other problems with your feet. Tell your health care provider if anything is not healing.  Kidney function test (urine microalbumin)  This test is performed once a year.  Type 1 diabetes: The first test is performed 5 years after diagnosis.  Type 2 diabetes: The first test is performed at the time of diagnosis.  A serum creatinine and estimated glomerular filtration rate (eGFR) test is done once a year to assess the level of chronic kidney disease (CKD), if present.  Lipid profile (cholesterol, HDL, LDL, triglycerides)  Performed every 5 years for most people.  The goal for LDL is less than 100 mg/dL. If you are at high risk, the goal is less than 70 mg/dL.  The goal for HDL is 40 mg/dL to 50 mg/dL for men and 50 mg/dL to 60 mg/dL for women. An HDL cholesterol of 60 mg/dL or higher gives some protection against heart disease.  The goal for triglycerides is less than 150 mg/dL.  Influenza vaccine, pneumococcal vaccine, and hepatitis B vaccine  The influenza vaccine is recommended yearly.  The pneumococcal vaccine is generally given once in a lifetime. However, there are some instances when another vaccination is recommended. Check with your health care provider.  The hepatitis B vaccine is also recommended for adults with diabetes.    Diabetes self-management education  Education is recommended at diagnosis and ongoing as needed.  Treatment plan  Your treatment plan is reviewed at every medical visit.  Document Released: 07/18/2009 Document Revised: 05/23/2013 Document Reviewed: 02/20/2013 [employer] Patient Information 2014 [employer].   

## 2015-07-08 LAB — BMP8+EGFR
BUN/Creatinine Ratio: 16 (ref 8–20)
BUN: 10 mg/dL (ref 6–20)
CO2: 26 mmol/L (ref 18–29)
Calcium: 9.3 mg/dL (ref 8.7–10.2)
Chloride: 99 mmol/L (ref 97–108)
Creatinine, Ser: 0.61 mg/dL (ref 0.57–1.00)
GFR calc Af Amer: 149 mL/min/{1.73_m2} (ref >59)
GFR, EST NON AFRICAN AMERICAN: 129 mL/min/{1.73_m2} (ref >59)
Glucose: 260 mg/dL — ABNORMAL HIGH (ref 65–99)
POTASSIUM: 3.9 mmol/L (ref 3.5–5.2)
SODIUM: 140 mmol/L (ref 134–144)

## 2015-07-21 ENCOUNTER — Encounter: Payer: Self-pay | Admitting: Nurse Practitioner

## 2015-08-13 ENCOUNTER — Ambulatory Visit (INDEPENDENT_AMBULATORY_CARE_PROVIDER_SITE_OTHER): Payer: Commercial Managed Care - PPO | Admitting: Pharmacist

## 2015-08-13 ENCOUNTER — Encounter: Payer: Self-pay | Admitting: Pharmacist

## 2015-08-13 VITALS — BP 90/68 | HR 72

## 2015-08-13 DIAGNOSIS — E1065 Type 1 diabetes mellitus with hyperglycemia: Secondary | ICD-10-CM

## 2015-08-13 NOTE — Patient Instructions (Signed)
Emergency Plan (if you are unable to use pump or if pump has malfunctioned)  [name] (long acting insulin) - inject 15 units once a day  Novolog - inject 4 units prior to each meal.  Call office for further instructions if you have to use above plan.    Diabetes and Standards of Medical Care   Diabetes is complicated. You may find that your diabetes team includes a dietitian, nurse, diabetes educator, eye doctor, and more. To help everyone know what is going on and to help you get the care you deserve, the following schedule of care was developed to help keep you on track. Below are the tests, exams, vaccines, medicines, education, and plans you will need.  Blood Glucose Goals Prior to meals = 80 - 130 Within 2 hours of the start of a meal = less than 180  HbA1c test (goal is less than 7.0% - your last value was %) This test shows how well you have controlled your glucose over the past 2 to 3 months. It is used to see if your diabetes management plan needs to be adjusted.   It is performed at least 2 times a year if you are meeting treatment goals.  It is performed 4 times a year if therapy has changed or if you are not meeting treatment goals.  Blood pressure test  This test is performed at every routine medical visit. The goal is less than 140/90 mmHg for most people, but 130/80 mmHg in some cases. Ask your health care provider about your goal.  Dental exam  Follow up with the dentist regularly.  Eye exam  If you are diagnosed with type 1 diabetes as a child, get an exam upon reaching the age of 67 years or older and have had diabetes for 3 to 5 years. Yearly eye exams are recommended after that initial eye exam.  If you are diagnosed with type 1 diabetes as an adult, get an exam within 5 years of diagnosis and then yearly.  If you are diagnosed with type 2 diabetes, get an exam as soon as possible after the diagnosis and then yearly.  Foot care exam  Visual foot exams are  performed at every routine medical visit. The exams check for cuts, injuries, or other problems with the feet.  A comprehensive foot exam should be done yearly. This includes visual inspection as well as assessing foot pulses and testing for loss of sensation.  Check your feet nightly for cuts, injuries, or other problems with your feet. Tell your health care provider if anything is not healing.  Kidney function test (urine microalbumin)  This test is performed once a year.  Type 1 diabetes: The first test is performed 5 years after diagnosis.  Type 2 diabetes: The first test is performed at the time of diagnosis.  A serum creatinine and estimated glomerular filtration rate (eGFR) test is done once a year to assess the level of chronic kidney disease (CKD), if present.  Lipid profile (cholesterol, HDL, LDL, triglycerides)  Performed every 5 years for most people.  The goal for LDL is less than 100 mg/dL. If you are at high risk, the goal is less than 70 mg/dL.  The goal for HDL is 40 mg/dL to 50 mg/dL for men and 50 mg/dL to 60 mg/dL for women. An HDL cholesterol of 60 mg/dL or higher gives some protection against heart disease.  The goal for triglycerides is less than 150 mg/dL.  Influenza vaccine, pneumococcal  vaccine, and hepatitis B vaccine  The influenza vaccine is recommended yearly.  The pneumococcal vaccine is generally given once in a lifetime. However, there are some instances when another vaccination is recommended. Check with your health care provider.  The hepatitis B vaccine is also recommended for adults with diabetes.  Diabetes self-management education  Education is recommended at diagnosis and ongoing as needed.  Treatment plan  Your treatment plan is reviewed at every medical visit.  Document Released: 07/18/2009 Document Revised: 05/23/2013 Document Reviewed: 02/20/2013 [facility] Patient Information 2014 [place].

## 2015-08-13 NOTE — Progress Notes (Addendum)
Patient ID: [name], female   DOB: December 21, 1990, 24 y.o.   MRN: [id number] Diabetes Follow-Up Visit Chief Complaint:   Chief Complaint  Patient presents with  . Diabetes     Filed Vitals:   08/13/15 1453  BP: 90/68  Pulse: 72   There were no vitals filed for this visit.  HPI:  [name] is a 24 yo woman here for adjustment of insulin pump.  She was last seen by myself 07/2015.  Since our visit she has had an episode at school where she passed out.  EMS was called and BG was over 400 but her BP was noted to be very low.  [facility].   [name] was diagnosed with type 1 DM in 2005 when she was 24yo and has a history of poor compliance with follow up.   She was started on an insulin pump 04/2012. She has a Scientist, [id number].  No CGM, though we have tried to get CGM but her insurance requires records that she is checking BG at least 4 times daily.  [name] has not been checking BG at all.   I downloaded pump information / report checking on average 0.8 times per day.      She uses Humalog insulin in pump.   Average BG - 357 +/- 126 Patient denies episodes of hypoglycemia in the last 2 weeks      Current Basal rates:     12am to 7am - 0.65      7am - 6pm - 1.20     6pm to midnight / 12am - 0.950     Insulin to CHO ratio to 10    insulin sensitivity to 50    Target BG 110  Exam Edema:  neg  Polyuria:  neg  Polydipsia:  neg Polyphagia:  neg  General Appearance:  alert, oriented, no acute distress and well nourished Mood/Affect:  normal   Low fat/carbohydrate diet?   improving Nicotine Abuse?  Yes -0.5 PPD, not interested in quitting currently Medication Compliance?  Slightly improved since she has episode last week.  She has checked BG about 2 to 3 times daily over last 5 days.   Lab Results  Component Value Date   HGBA1C 13.3 07/07/2015    No results found for: [facility]  Lab Results  Component Value Date   CHOL 143 11/19/2013   HDL 32* 11/19/2013   LDLCALC 88 11/19/2013   LDLDIRECT 88 11/19/2013   TRIG 116* 11/19/2013   CHOLHDL 4.5* 11/19/2013      Assessment: 1.  Diabetes.  Type 1 uncontrolled - due to poor compliance with self BG monitoring and bolusing with insulin prior to meals 2.  Blood Pressure.  Controlled  Recommendations: 1.  Insulin pump changes:    12am to 7am - 0.70     7am - 6pm - 1.30     6pm to midnight / 12am - 1.00     Insulin to CHO ratio to 10    insulin sensitivity to 60    Target BG 110  2.  She is instructed to check BG at a minimum tid - discussed why it is important to check BG regularly.  Also discussed changing infusion get every 3 days (current changing about every 5 to 6 days).  Reviewed long term complications of uncontrolled DM 3.  Reviewed HBG goals:  Fasting 80-130 and 1-2 hour post prandial <180.      4.  Reviewed s/s of hypoglycemia and hyperglycemia.  Patient is instructed to call office if BG is less than 70 or greater than 250 on more than 2 occassions in 1 week for pump adjustment.  5.  BP goal < 140/80. 6.  Patient was given emergency plan incase she has problems with pump.  If she has BG greater than 400 she is to change infusion set and recheck BG in 1 hour.  If still great than 400 she is to call office for instruction.  If she must stop using pump -  She is to use [place] (long acting insulin) 15 units daily and Humalog  / Novolog 4 units prior to meals.  She is to notify office if she needs to use this plan.  7. Will follow up in 3 weeks for further adjustment 8.  Requesting records from ER visit to [facility].  9.  Referral to endocrinologist.  10.  Foot exam performed today and WNL  Orders Placed This Encounter  Procedures  . Microalbumin/Creatinine Ratio, Urine  . Ambulatory referral to Endocrinology    Referral Priority:  Routine    Referral Type:  Consultation    Referral Reason:  Specialty Services Required    Number of Visits  Requested:  1     Time spent counseling patient:  35 minutes    Henrene Pastor, PharmD, CPP.CDE

## 2015-08-14 LAB — MICROALBUMIN / CREATININE URINE RATIO
Creatinine, Urine: 48 mg/dL
MICROALB/CREAT RATIO: <6.3 mg/g creat (ref 0.0–30.0)
Microalbumin, Urine: <3 ug/mL

## 2015-08-21 ENCOUNTER — Ambulatory Visit: Payer: Commercial Managed Care - PPO | Admitting: Endocrinology

## 2015-09-04 ENCOUNTER — Ambulatory Visit: Payer: Self-pay | Admitting: Pharmacist

## 2015-11-24 ENCOUNTER — Other Ambulatory Visit: Payer: Self-pay | Admitting: Nurse Practitioner

## 2015-11-26 ENCOUNTER — Ambulatory Visit (INDEPENDENT_AMBULATORY_CARE_PROVIDER_SITE_OTHER): Payer: Commercial Managed Care - PPO | Admitting: Pharmacist

## 2015-11-26 ENCOUNTER — Encounter: Payer: Self-pay | Admitting: Pharmacist

## 2015-11-26 ENCOUNTER — Telehealth: Payer: Self-pay | Admitting: Nurse Practitioner

## 2015-11-26 VITALS — BP 99/62 | HR 80 | Ht 65.0 in | Wt 127.0 lb

## 2015-11-26 DIAGNOSIS — E1065 Type 1 diabetes mellitus with hyperglycemia: Principal | ICD-10-CM

## 2015-11-26 DIAGNOSIS — IMO0001 Reserved for inherently not codable concepts without codable children: Secondary | ICD-10-CM

## 2015-11-26 DIAGNOSIS — E109 Type 1 diabetes mellitus without complications: Secondary | ICD-10-CM | POA: Diagnosis not present

## 2015-11-26 LAB — POCT GLYCOSYLATED HEMOGLOBIN (HGB A1C)

## 2015-11-26 MED ORDER — INSULIN LISPRO 100 UNIT/ML ~~LOC~~ SOLN: SUBCUTANEOUS | Status: DC

## 2015-11-26 MED ORDER — INSULIN ASPART 100 UNIT/ML ~~LOC~~ SOLN: SUBCUTANEOUS | Status: DC

## 2015-11-26 NOTE — Progress Notes (Signed)
Patient ID: [name], female   DOB: 10/30/1990, 25 y.o.   MRN: [id number] Diabetes Follow-Up Visit Chief Complaint:   No chief complaint on file.    Filed Vitals:   11/26/15 0855  BP: 99/62  Pulse: 80   Filed Weights   11/26/15 0855  Weight: 127 lb (57.607 kg)    HPI:  [name] is a 25 yo woman here for adjustment of insulin pump.  She was last seen by myself 08/2015.  Since our last visit compliance with checking BG has worsened.  I had referred her to an endocrinologist.  I looks like they attempted to contact patient regarding insurance but she never called them back and appt was never made.   [name] was diagnosed with type 1 DM in 2005 when she was 25yo and has a history of poor compliance with follow up.   She was started on an insulin pump 04/2012. She has a Scientist, forensic.  No CGM, though we have tried to get CGM but her insurance requires records that she is checking BG at least 4 times daily.    I downloaded pump information / report checking on average 0.6 times per day.      She uses Humalog insulin in pump.   Average BG - 446 +/- 101 (increased from last visit) Patient denies episodes of hypoglycemia in the last 4 weeks Reports BG was 303 this am around 7:50am - gave correction dose.  Now BG is 157 at 8:40am      Current Basal rates:   12am to 7am - 0.70     7am - 6pm - 1.30     6pm to midnight / 12am - 1.00     Insulin to CHO ratio to 10    insulin sensitivity to 60    Target BG 110      Exam Edema:  neg  Polyuria:  neg  Polydipsia:  neg Polyphagia:  Neg  General Appearance:  alert, oriented, no acute distress and well nourished Mood/Affect:  normal   Review of Systems  Constitutional: Negative.   HENT: Negative.   Eyes: Negative.   Respiratory: Negative.   Cardiovascular: Negative.   Gastrointestinal: Negative.   Genitourinary: Negative.   Musculoskeletal: Negative.   Skin: Negative.   Neurological: Negative.    Endo/Heme/Allergies: Negative for polydipsia.  Psychiatric/Behavioral: Negative.    patient does report fatigue     Low fat/carbohydrate diet?   improving Nicotine Abuse?  Yes -0.5 PPD, not interested in quitting currently   Lab Results  Component Value Date   HGBA1C >14.0 11/26/2015    No results found for: [facility]  Lab Results  Component Value Date   CHOL 143 11/19/2013   HDL 32* 11/19/2013   LDLCALC 88 11/19/2013   LDLDIRECT 88 11/19/2013   TRIG 116* 11/19/2013   CHOLHDL 4.5* 11/19/2013      Assessment: 1.  Diabetes.  Type 1 uncontrolled - due to poor compliance with self BG monitoring and bolusing with insulin prior to meals 2.  Blood Pressure.  Controlled  Recommendations: 1.  Insulin pump changes:    12am to 7am - 0.80     7am - 6pm - 1.40     6pm to midnight / 12am - 1.10     Insulin to CHO ratio to 10    insulin sensitivity to 60    Target BG 110  2.  She is instructed to check BG at a minimum tid - discussed why  it is important to check BG regularly.  Again discussed changing infusion set every 3 days (current changing about every 5 to 6 days).  Reviewed HBG goals:  Fasting 80-130 and 1-2 hour post prandial <180.   3.  Reviewed long term complications of uncontrolled DM     4.  BP goal < 140/80. 5. Will follow up in 2 weeks for further adjustment 6.  Requested records from optometrist 7. Appt made for PAP smear - 2 weeks 8.  Referral to endocrinologist.    Orders Placed This Encounter  Procedures  . POCT glycosylated hemoglobin (Hb A1C)     Time spent counseling patient:  30 minutes    Henrene Pastor, PharmD, CPP, CDE

## 2015-11-26 NOTE — Telephone Encounter (Signed)
Please advise 

## 2015-11-26 NOTE — Telephone Encounter (Signed)
What will medicaid cover?

## 2015-12-11 ENCOUNTER — Encounter: Payer: Self-pay | Admitting: Nurse Practitioner

## 2015-12-11 NOTE — Progress Notes (Signed)
Patient ID: Patricia DuttonKristine [name], female   DOB: [birth year], 25 y.o.   MRN: [id number] Patient cancelled appointment

## 2015-12-12 ENCOUNTER — Encounter: Payer: Self-pay | Admitting: Nurse Practitioner

## 2015-12-17 ENCOUNTER — Ambulatory Visit: Payer: Commercial Managed Care - PPO | Admitting: Family

## 2015-12-18 ENCOUNTER — Encounter: Payer: Self-pay | Admitting: Nurse Practitioner

## 2016-01-19 ENCOUNTER — Ambulatory Visit (INDEPENDENT_AMBULATORY_CARE_PROVIDER_SITE_OTHER): Payer: Commercial Managed Care - PPO | Admitting: Pharmacist

## 2016-01-19 ENCOUNTER — Encounter: Payer: Self-pay | Admitting: Pharmacist

## 2016-01-19 VITALS — BP 98/64 | HR 82 | Ht 65.0 in | Wt 126.0 lb

## 2016-01-19 DIAGNOSIS — E1065 Type 1 diabetes mellitus with hyperglycemia: Secondary | ICD-10-CM | POA: Diagnosis not present

## 2016-01-19 DIAGNOSIS — E109 Type 1 diabetes mellitus without complications: Secondary | ICD-10-CM | POA: Insufficient documentation

## 2016-01-19 HISTORY — DX: Type 1 diabetes mellitus without complications: E10.9

## 2016-01-19 NOTE — Progress Notes (Signed)
Patient ID: Patricia DuttonKristine Fox, female   DOB: Aug 01, 1991, 25 y.o.   MRN: [id number] Diabetes Follow-Up Visit Chief Complaint:   Chief Complaint  Patient presents with  . Diabetes     Filed Vitals:   01/19/16 1702  BP: 98/64  Pulse: 82   Filed Weights   01/19/16 1702  Weight: 126 lb (57.153 kg)    HPI:  [name] is a 25 yo woman here for adjustment of insulin pump and to get a new glucometer.  She has a Coutour Next [employer] glucometer but it required port to charge battery.  Patient has recently moved from her partents home and does not have a computer to other means to charge glucometer.  She has not been checking BG over the last 2-4 week.s  She was last seen by myself 11/26/2015 when A1c was found to be over 14%.    I have referred her to an endocrinologist in past however patient never returned calls regarding appt.   Patricia BrunsKristine was diagnosed with type 1 DM in 2005 when she was 25yo and has a history of poor compliance with follow up.   She was started on an insulin pump 04/2012. She has a Scientist, [id number] [employer]tr[employer] Minimed Paradigm.  No CGM, though we have tried to get CGM but her insurance requires records that she is checking BG at least 4 times daily.    I downloaded pump information / report.  Only one BG reading in last 14 days which was 103.    She uses Humalog insulin in pump.   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   Current Basal rates:   12am to 7am - 0.80     7am - 6pm - 1.40     6pm to midnight / 12am - 1.10     Insulin to CHO ratio to 10    insulin sensitivity to 60    Target BG 110        Exam Edema:  neg  Polyuria:  neg  Polydipsia:  neg Polyphagia:  Neg  General Appearance:  alert, oriented, no acute distress and well nourished Mood/Affect:  normal   Review of Systems  Constitutional: Negative.   HENT: Negative.   Eyes: Negative.    Respiratory: Negative.   Cardiovascular: Negative.   Gastrointestinal: Negative.   Genitourinary: Negative.  Negative for dysuria.  Musculoskeletal: Negative.   Skin: Negative.   Neurological: Negative.   Endo/Heme/Allergies: Negative for polydipsia.  Psychiatric/Behavioral: Negative.      Low fat/carbohydrate diet?   improving Nicotine Abuse?  Yes -0.5 PPD, not interested in quitting currently   Lab Results  Component Value Date   HGBA1C >14.0 11/26/2015    [facility]  Lab Results  Component Value Date   CHOL 143 11/19/2013   HDL 32* 11/19/2013   LDLCALC 88 11/19/2013   LDLDIRECT 88 11/19/2013   TRIG 116* 11/19/2013   CHOLHDL 4.5* 11/19/2013      Assessment: 1.  Diabetes.  Type 1 uncontrolled - due to poor compliance with self BG monitoring.  Needs new glucometer 2.  Blood Pressure.  Controlled  Recommendations: 1.  Insulin pump changes - none as there is not enough HBG readings to make changes.  2.  She is instructed to check BG at a minimum tid.  Patient is given Contour Next Easy glucometer  since this will work with the current testing supplies she has.  Taught patient how to use and how to manually input results into the Bolus Wizard in her pump.  Reviewed HBG goals:  Fasting 80-130 and 1-2 hour post prandial <180.   3.  Reviewed long term complications of uncontrolled DM     4.  BP goal < 140/80. 5. Taught patient how to use temporary basal function in her pump to decrease basal rate by 90% when she is working at [employer].  6.  Reminded that eye exam is due 7. Appt made for PAP smear - 4 weeks 8.  Follow up with me in 2 months   Time spent counseling patient:  30 minutes    Henrene Pastor, PharmD, CPP, CDE

## 2016-01-19 NOTE — Patient Instructions (Signed)
Diabetes and Standards of Medical Care   Diabetes is complicated. You may find that your diabetes team includes a dietitian, nurse, diabetes educator, eye doctor, and more. To help everyone know what is going on and to help you get the care you deserve, the following schedule of care was developed to help keep you on track. Below are the tests, exams, vaccines, medicines, education, and plans you will need.  Blood Glucose Goals Prior to meals = 80 - 130 Within 2 hours of the start of a meal = less than 180  HbA1c test (goal is less than 7.0% - your last value was over 14%) This test shows how well you have controlled your glucose over the past 2 to 3 months. It is used to see if your diabetes management plan needs to be adjusted.   It is performed at least 2 times a year if you are meeting treatment goals.  It is performed 4 times a year if therapy has changed or if you are not meeting treatment goals.  Blood pressure test  This test is performed at every routine medical visit. The goal is less than 140/90 mmHg for most people, but 130/80 mmHg in some cases. Ask your health care provider about your goal.  Dental exam  Follow up with the dentist regularly.  Eye exam  If you are diagnosed with type 1 diabetes as a child, get an exam upon reaching the age of 10 years or older and have had diabetes for 3 to 5 years. Yearly eye exams are recommended after that initial eye exam.  If you are diagnosed with type 1 diabetes as an adult, get an exam within 5 years of diagnosis and then yearly.  If you are diagnosed with type 2 diabetes, get an exam as soon as possible after the diagnosis and then yearly.  Foot care exam  Visual foot exams are performed at every routine medical visit. The exams check for cuts, injuries, or other problems with the feet.  A comprehensive foot exam should be done yearly. This includes visual inspection as well as assessing foot pulses and testing for loss of  sensation.  Check your feet nightly for cuts, injuries, or other problems with your feet. Tell your health care provider if anything is not healing.  Kidney function test (urine microalbumin)  This test is performed once a year.  Type 1 diabetes: The first test is performed 5 years after diagnosis.  Type 2 diabetes: The first test is performed at the time of diagnosis.  A serum creatinine and estimated glomerular filtration rate (eGFR) test is done once a year to assess the level of chronic kidney disease (CKD), if present.  Lipid profile (cholesterol, HDL, LDL, triglycerides)  Performed every 5 years for most people.  The goal for LDL is less than 100 mg/dL. If you are at high risk, the goal is less than 70 mg/dL.  The goal for HDL is 40 mg/dL to 50 mg/dL for men and 50 mg/dL to 60 mg/dL for women. An HDL cholesterol of 60 mg/dL or higher gives some protection against heart disease.  The goal for triglycerides is less than 150 mg/dL.  Influenza vaccine, pneumococcal vaccine, and hepatitis B vaccine  The influenza vaccine is recommended yearly.  The pneumococcal vaccine is generally given once in a lifetime. However, there are some instances when another vaccination is recommended. Check with your health care provider.  The hepatitis B vaccine is also recommended for adults with diabetes.    Diabetes self-management education  Education is recommended at diagnosis and ongoing as needed.  Treatment plan  Your treatment plan is reviewed at every medical visit.  Document Released: 07/18/2009 Document Revised: 05/23/2013 Document Reviewed: 02/20/2013 Exit[employer] Patient Information 2014 [employer].   

## 2016-02-10 LAB — HM DIABETES EYE EXAM

## 2016-02-27 ENCOUNTER — Other Ambulatory Visit: Payer: Self-pay | Admitting: Nurse Practitioner

## 2016-03-12 ENCOUNTER — Encounter: Payer: Self-pay | Admitting: Nurse Practitioner

## 2016-03-12 ENCOUNTER — Ambulatory Visit (INDEPENDENT_AMBULATORY_CARE_PROVIDER_SITE_OTHER): Payer: Commercial Managed Care - PPO | Admitting: Nurse Practitioner

## 2016-03-12 VITALS — BP 108/71 | HR 73 | Temp 97.3°F | Ht 65.0 in | Wt 125.0 lb

## 2016-03-12 DIAGNOSIS — Z01419 Encounter for gynecological examination (general) (routine) without abnormal findings: Secondary | ICD-10-CM

## 2016-03-12 DIAGNOSIS — IMO0001 Reserved for inherently not codable concepts without codable children: Secondary | ICD-10-CM

## 2016-03-12 DIAGNOSIS — Z0000 Encounter for general adult medical examination without abnormal findings: Secondary | ICD-10-CM

## 2016-03-12 DIAGNOSIS — E1065 Type 1 diabetes mellitus with hyperglycemia: Secondary | ICD-10-CM

## 2016-03-12 DIAGNOSIS — F319 Bipolar disorder, unspecified: Secondary | ICD-10-CM | POA: Insufficient documentation

## 2016-03-12 LAB — URINALYSIS, COMPLETE
Bilirubin, UA: NEGATIVE
KETONES UA: NEGATIVE
LEUKOCYTES UA: NEGATIVE
NITRITE UA: NEGATIVE
PROTEIN UA: NEGATIVE
RBC, UA: NEGATIVE
SPEC GRAV UA: 1.02 (ref 1.005–1.030)
Urobilinogen, Ur: 0.2 mg/dL (ref 0.2–1.0)
pH, UA: 6 (ref 5.0–7.5)

## 2016-03-12 LAB — MICROSCOPIC EXAMINATION: Epithelial Cells (non renal): >10 /hpf — AB (ref 0–10)

## 2016-03-12 LAB — BAYER DCA HB A1C WAIVED: HB A1C: 9.8 % — AB (ref <7.0)

## 2016-03-12 NOTE — Progress Notes (Signed)
Subjective:    Patient ID: [name], female    DOB: 12-Jul-1991, 25 y.o.   MRN: [id number]  Patient here today for annual physical, PAP and  follow up of chronic medical problems.  Outpatient Encounter Prescriptions as of 03/12/2016  Medication Sig  . etonogestrel (NEXPLANON) 68 MG IMPL implant Inject 1 each into the skin once.  . insulin lispro (HUMALOG) 100 UNIT/ML injection PER INSULIN PUMP.  Average daily insulin is about 40 units daily   No facility-administered encounter medications on file as of 03/12/2016.    Diabetes She presents for her follow-up diabetic visit. She has type 1 diabetes mellitus. No MedicAlert identification noted. Her disease course has been stable. There are no hypoglycemic associated symptoms. Pertinent negatives for diabetes include no chest pain, no fatigue, no foot paresthesias, no polydipsia, no polyphagia and no polyuria. There are no hypoglycemic complications. Symptoms are stable. There are no diabetic complications. Risk factors for coronary artery disease include diabetes mellitus. She is compliant with treatment most of the time. Her weight is stable. She is following a diabetic diet. When asked about meal planning, she reported none. She has not had a previous visit with a dietitian. She rarely participates in exercise. There is no change in her home blood glucose trend. Her breakfast blood glucose is taken between 9-10 am. Her breakfast blood glucose range is generally 130-140 mg/dl. Her lunch blood glucose is taken between 2-3 pm. Her lunch blood glucose range is generally 130-140 mg/dl. Her dinner blood glucose range is generally 140-180 mg/dl. Her highest blood glucose is >200 mg/dl. Her overall blood glucose range is 140-180 mg/dl. An ACE inhibitor/angiotensin II receptor blocker is not being taken. She does not see a podiatrist.Eye exam is not current.  bipolar disorder Currently not on anything- says that she is doing okay right noiw and does not  need anything.     Review of Systems  Constitutional: Negative for fatigue.  Cardiovascular: Negative for chest pain.  Endocrine: Negative for polydipsia, polyphagia and polyuria.       Objective:   Physical Exam  Constitutional: She is oriented to person, place, and time. She appears well-developed and well-nourished.  HENT:  Head: Normocephalic.  Right Ear: Hearing, tympanic membrane, external ear and ear canal normal.  Left Ear: Hearing, tympanic membrane, external ear and ear canal normal.  Nose: Nose normal.  Mouth/Throat: Uvula is midline and oropharynx is clear and moist.  Eyes: Conjunctivae and EOM are normal. Pupils are equal, round, and reactive to light.  Neck: Normal range of motion and full passive range of motion without pain. Neck supple. No JVD present. Carotid bruit is not present. No thyroid mass and no thyromegaly present.  Cardiovascular: Normal rate, normal heart sounds and intact distal pulses.   No murmur heard. Pulmonary/Chest: Effort normal and breath sounds normal. Right breast exhibits no inverted nipple, no mass, no nipple discharge, no skin change and no tenderness. Left breast exhibits no inverted nipple, no mass, no nipple discharge, no skin change and no tenderness.  Abdominal: Soft. Bowel sounds are normal. She exhibits no mass. There is no tenderness.  Genitourinary: Vagina normal and uterus normal. No breast swelling, tenderness, discharge or bleeding. No vaginal discharge found.  bimanual exam-No adnexal masses or tenderness. Cervix parous and pink  Musculoskeletal: Normal range of motion.  Lymphadenopathy:    She has no cervical adenopathy.  Neurological: She is alert and oriented to person, place, and time.  Skin: Skin is warm and dry.  Psychiatric: She has a normal mood and affect. Her behavior is normal. Judgment and thought content normal.   BP 108/71 mmHg  Pulse 73  Temp(Src) 97.3 F (36.3 C) (Oral)  Ht _0  (1.651 m)  Wt 125 lb  (56.7 kg)  BMI 20.80 kg/m2  HGBA1C 9.8% down from >14 11/26/15      Assessment & Plan:  1. Bipolar I disorder ([place]) Continue stress management and RTO is worsens  2. Uncontrolled type 1 diabetes mellitus without complication ([place]) Stricter carb counting Keep follow up appointment with clinical pharmacist for pump changes - Bayer DCA Hb A1c Waived - CMP14+EGFR - Lipid panel  3. Annual physical exam - Urinalysis, Complete  4. Encounter for routine gynecological examination - Pap IG, CT/NG w/ reflex HPV when ASC-U    Labs pending Health maintenance reviewed Diet and exercise encouraged Continue all meds Follow up  In 3 months   [place], FNP

## 2016-03-13 LAB — CMP14+EGFR
A/G RATIO: 1.8 (ref 1.2–2.2)
ALK PHOS: 79 IU/L (ref 39–117)
ALT: 12 IU/L (ref 0–32)
AST: 8 IU/L (ref 0–40)
Albumin: 4.6 g/dL (ref 3.5–5.5)
BILIRUBIN TOTAL: 0.3 mg/dL (ref 0.0–1.2)
BUN/Creatinine Ratio: 17 (ref 9–23)
BUN: 13 mg/dL (ref 6–20)
CHLORIDE: 96 mmol/L (ref 96–106)
CO2: 26 mmol/L (ref 18–29)
Calcium: 9.7 mg/dL (ref 8.7–10.2)
Creatinine, Ser: 0.78 mg/dL (ref 0.57–1.00)
GFR calc Af Amer: 125 mL/min/{1.73_m2} (ref >59)
GFR calc non Af Amer: 108 mL/min/{1.73_m2} (ref >59)
GLOBULIN, TOTAL: 2.5 g/dL (ref 1.5–4.5)
Glucose: 377 mg/dL — ABNORMAL HIGH (ref 65–99)
POTASSIUM: 4.2 mmol/L (ref 3.5–5.2)
SODIUM: 136 mmol/L (ref 134–144)
Total Protein: 7.1 g/dL (ref 6.0–8.5)

## 2016-03-13 LAB — LIPID PANEL
CHOL/HDL RATIO: 4.6 ratio — AB (ref 0.0–4.4)
CHOLESTEROL TOTAL: 155 mg/dL (ref 100–199)
HDL: 34 mg/dL — ABNORMAL LOW (ref >39)
LDL Calculated: 93 mg/dL (ref 0–99)
TRIGLYCERIDES: 141 mg/dL (ref 0–149)
VLDL Cholesterol Cal: 28 mg/dL (ref 5–40)

## 2016-03-16 LAB — PAP IG, CT-NG, RFX HPV ASCU
CHLAMYDIA, NUC. ACID AMP: NEGATIVE
Gonococcus by Nucleic Acid Amp: NEGATIVE
PAP SMEAR COMMENT: 0

## 2016-03-29 ENCOUNTER — Encounter: Payer: Self-pay | Admitting: Pharmacist

## 2016-03-29 ENCOUNTER — Ambulatory Visit (INDEPENDENT_AMBULATORY_CARE_PROVIDER_SITE_OTHER): Payer: Commercial Managed Care - PPO | Admitting: Pharmacist

## 2016-03-29 VITALS — BP 110/70 | HR 80 | Ht 65.0 in | Wt 125.2 lb

## 2016-03-29 DIAGNOSIS — E1065 Type 1 diabetes mellitus with hyperglycemia: Secondary | ICD-10-CM | POA: Diagnosis not present

## 2016-03-29 MED ORDER — BAYER CONTOUR NEXT USB MONITOR W/DEVICE KIT: PACK | Status: DC

## 2016-03-29 NOTE — Progress Notes (Signed)
Patient ID: [name], female   DOB: Feb 07, 1991, 25 y.o.   MRN: [id number] Diabetes Follow-Up Visit Chief Complaint:   Chief Complaint  Patient presents with  . Diabetes     Filed Vitals:   03/29/16 1436  BP: 110/70  Pulse: 80   Filed Weights   03/29/16 1436  Weight: 125 lb 4 oz (56.813 kg)    HPI:  [name] is a 25 yo woman here for adjustment of insulin pump and to get a new glucometer.  She has a Coutour Next USB glucometer but it requires a port to charge battery.   Patient has recently moved from her partents home and does not have a computer or other means to charge glucometer.  She at one time has wall charger but has lost it.  She is checking BG with meter I gave her at last visit but it does not automatically send results to her insulin pump and she has not been entering manually.   [name] was diagnosed with type 1 DM in 2005 when she was 25yo.   She was started on an insulin pump 04/2012. She has a Scientist, [id number] [employer] Minimed Paradigm.  No CGM, though we have tried to get CGM but her insurance requires records that she is checking BG at least 4 times daily.    I downloaded pump information / report.  There is not BG data to review.   She uses Humalog insulin in pump.   BG range per patient is 170's to 270's.  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   Current Basal rates:   12am to 7am - 0.80     7am - 6pm - 1.40     6pm to midnight / 12am - 1.10     Insulin to CHO ratio to 10    insulin sensitivity to 60    Target BG 110        Review of Systems  Constitutional: Negative.   HENT: Negative.   Eyes: Negative.   Respiratory:       Runny nose   Cardiovascular: Negative.   Gastrointestinal: Negative.   Genitourinary: Negative.  Negative for dysuria.  Musculoskeletal: Negative.   Skin: Negative.   Neurological:  Negative.   Endo/Heme/Allergies: Negative for polydipsia.  Psychiatric/Behavioral: Negative.      Low fat/carbohydrate diet?   improving Nicotine Abuse?  Yes -0.5 PPD, not interested in quitting currently   Lab Results  Component Value Date   AGBA1c 9.8% 03/12/2016   HGBA1C >14.0 11/26/2015    [facility]: [facility]  Lab Results  Component Value Date   CHOL 155 03/12/2016   HDL 34* 03/12/2016   LDLCALC 93 03/12/2016   LDLDIRECT 88 11/19/2013   TRIG 141 03/12/2016   CHOLHDL 4.6* 03/12/2016      Assessment: 1.  Diabetes.  Type 1 uncontrolled - A1c is not at goal but has improved significantly over the last 4 months 2.  Blood Pressure.  Controlled  Recommendations: 1.  Insulin pump changes:  Basal rates:    12am to 7am - 0.85     7am - 6pm - 1.40     6pm to midnight / 12am - 1.10     Insulin to CHO ratio to 10    insulin sensitivity to 60    Target  BG 110   2.  She is instructed to check BG at a minimum tid.  Rx sent to [facility] for Contour Next USB with coupon to help with cost. g  Reviewed HBG goals:  Fasting 80-130 and 1-2 hour post prandial <180.   3.  Reviewed long term complications of uncontrolled DM     4.  BP goal < 140/80. 5. Checking into getting patient new pump and also to see if her insurance will allow our office to do 14 days CGM. 6.  Reminded that eye exam is due 7.  Follow up with me in 1 month   Time spent counseling patient:  30 minutes    Henrene Pastorammy Khaleel Beckom, PharmD, CPP, CDE

## 2016-04-30 ENCOUNTER — Other Ambulatory Visit: Payer: Self-pay | Admitting: Pharmacist

## 2016-05-11 ENCOUNTER — Telehealth: Payer: Self-pay | Admitting: Pharmacist

## 2016-05-11 NOTE — Telephone Encounter (Signed)
Set up appointment for tomorrow at 11:15.  Unable to reach patient but left message about appt.  Instructed that if this did not fit for her to call back to reschedule.

## 2016-05-12 ENCOUNTER — Encounter: Payer: Self-pay | Admitting: Pharmacist

## 2016-05-12 ENCOUNTER — Ambulatory Visit (INDEPENDENT_AMBULATORY_CARE_PROVIDER_SITE_OTHER): Payer: Commercial Managed Care - PPO | Admitting: Pharmacist

## 2016-05-12 VITALS — Ht 65.0 in | Wt 125.5 lb

## 2016-05-12 DIAGNOSIS — E1065 Type 1 diabetes mellitus with hyperglycemia: Secondary | ICD-10-CM | POA: Diagnosis not present

## 2016-05-12 MED ORDER — ACETONE (URINE) TEST VI STRP: 1.0000 | ORAL_STRIP | 2 refills | Status: DC | PRN

## 2016-05-12 NOTE — Progress Notes (Signed)
Patient ID: Patricia DuttonKristine Fox, female   DOB: 03/15/1991, 25 y.o.   MRN: [id number] Diabetes Follow-Up Visit Chief Complaint:   Chief Complaint  Patient presents with  . Diabetes     There were no vitals filed for this visit. Filed Weights   05/12/16 1202  Weight: 125 lb 8 oz (56.9 kg)    HPI:  [name] is a 25 yo woman here for programaing of her new insulin pump.  She received new pump about 3 days ago.  [name] was diagnosed with type 1 DM in 2005 when she was 25yo.   She was started on an insulin pump 04/2012. She has a Optician, dispensing670G Medtr[employer] Minimed Paradigm.  She is expecting to received CGM within then next 2 weeks.   I downloaded pump information / report.  There is not BG data to review.   She uses Humalog insulin in pump.   Most recent A1c was 9.8% which is the lowest A1c she has had in the last year.    Current Basal rates:   12am to 7am - 0.85     7am - 6pm - 1.40     6pm to midnight / 12am - 1.10     Insulin to CHO ratio to 10    insulin sensitivity to 60    Target BG 110  Active insulin = 2 hours       Review of Systems  Constitutional: Negative.   HENT: Negative.   Eyes: Negative.   Respiratory: Negative.           Cardiovascular: Negative.   Gastrointestinal: Negative.   Genitourinary: Negative.  Negative for dysuria.  Musculoskeletal: Negative.   Skin: Negative.   Neurological: Negative.   Endo/Heme/Allergies: Negative for polydipsia.  Psychiatric/Behavioral: Negative.      Low fat/carbohydrate diet?   improving Nicotine Abuse?  Yes -0.5 PPD, not interested in quitting currently   Lab Results  Component Value Date   AGBA1c 9.8% 03/12/2016   HGBA1C >14.0 11/26/2015    [facility] found [facility]: [facility]  Lab Results  Component Value Date   CHOL 155 03/12/2016   HDL 34 (L) 03/12/2016   LDLCALC 93 03/12/2016   LDLDIRECT 88 11/19/2013   TRIG 141 03/12/2016   CHOLHDL 4.6 (H) 03/12/2016      Assessment: 1.  Diabetes.  Type 1 uncontrolled -  A1c is not at goal but has improved over the last 6 montsh 2.  Blood Pressure.  Controlled  Recommendations: 1.  Insulin pump programed with below values.  Unable to really adjust as I have not BG reading to use to guide changes.  Basal rates:    12am to 7am - 0.85     7am - 6pm - 1.40     6pm to midnight / 12am - 1.10     Insulin to CHO ratio to 10    insulin sensitivity to 60    Target BG 110  Active insulin changed to 3 hours   2.  She is instructed to check BG at a minimum tid.  Hopefully when she gets CGM this will improved her ability to adjust insulin requirement based on BG readings.  Reviewed HBG goals:  Fasting 80-130 and 1-2 hour post prandial <180.   3.  Reviewed long term complications of uncontrolled DM     4.  BP goal < 140/80. 5.  Follow up with me in 1 month or sooner if CMG come in.   Time spent counseling patient:  30  minutes    Henrene Pastor, PharmD, CPP, CDE

## 2016-05-31 ENCOUNTER — Telehealth: Payer: Self-pay | Admitting: Nurse Practitioner

## 2016-05-31 NOTE — Telephone Encounter (Signed)
Call handled

## 2016-06-11 ENCOUNTER — Ambulatory Visit: Payer: Commercial Managed Care - PPO | Admitting: Family

## 2016-06-11 ENCOUNTER — Encounter: Payer: Self-pay | Admitting: Family

## 2016-06-11 VITALS — BP 102/72 | HR 96 | Temp 98.8°F | Ht 65.0 in | Wt 124.4 lb

## 2016-06-11 DIAGNOSIS — M545 Low back pain: Secondary | ICD-10-CM

## 2016-06-11 LAB — URINALYSIS, COMPLETE
Bilirubin, UA: NEGATIVE
Ketones, UA: NEGATIVE
Leukocytes, UA: NEGATIVE
Nitrite, UA: NEGATIVE
PROTEIN UA: NEGATIVE
RBC, UA: NEGATIVE
Specific Gravity, UA: 1.02 (ref 1.005–1.030)
Urobilinogen, Ur: 0.2 mg/dL (ref 0.2–1.0)
pH, UA: 5.5 (ref 5.0–7.5)

## 2016-06-11 LAB — MICROSCOPIC EXAMINATION: RBC, UA: NONE SEEN /hpf (ref 0–?)

## 2016-06-11 MED ORDER — CYCLOBENZAPRINE HCL 5 MG PO TABS: 5.0000 mg | ORAL_TABLET | Freq: Three times a day (TID) | ORAL | 0 refills | Status: DC | PRN

## 2016-06-11 MED ORDER — NAPROXEN 500 MG PO TABS: 500.0000 mg | ORAL_TABLET | Freq: Two times a day (BID) | ORAL | 1 refills | Status: DC

## 2016-06-11 NOTE — Progress Notes (Signed)
   Subjective:    Patient ID: [name], female    DOB: 1991/03/10, 25 y.o.   MRN: [id number]  Back Pain  This is a new problem. The current episode started more than 1 month ago. The problem occurs constantly. The problem has been gradually worsening since onset. The pain is present in the lumbar spine. The quality of the pain is described as aching. The pain is at a severity of 8/10. The pain is moderate. The symptoms are aggravated by bending. Associated symptoms include numbness and tingling. Pertinent negatives include no bladder incontinence, bowel incontinence, chest pain, dysuria, fever, headaches, leg pain or weakness. She has tried bed rest and NSAIDs for the symptoms. The treatment provided mild relief.      Review of Systems  Constitutional: Negative.  Negative for fever.  HENT: Negative.   Eyes: Negative.   Respiratory: Negative.  Negative for shortness of breath.   Cardiovascular: Negative.  Negative for chest pain and palpitations.  Gastrointestinal: Negative.  Negative for bowel incontinence.  Endocrine: Negative.   Genitourinary: Negative.  Negative for bladder incontinence and dysuria.  Musculoskeletal: Positive for back pain.  Neurological: Positive for tingling and numbness. Negative for weakness and headaches.  Hematological: Negative.   Psychiatric/Behavioral: Negative.   All other systems reviewed and are negative.      Objective:   Physical Exam  Constitutional: She is oriented to person, place, and time. She appears well-developed and well-nourished. No distress.  HENT:  Head: Normocephalic.  Eyes: Pupils are equal, round, and reactive to light.  Neck: Normal range of motion. Neck supple. No thyromegaly present.  Cardiovascular: Normal rate, regular rhythm, normal heart sounds and intact distal pulses.   No murmur heard. Pulmonary/Chest: Effort normal and breath sounds normal. No respiratory distress. She has no wheezes.  Abdominal: Soft. Bowel  sounds are normal. She exhibits no distension. There is no tenderness.  Musculoskeletal: Normal range of motion. She exhibits tenderness (lower lumbar tenderness, pain with bending). She exhibits no edema.  Neurological: She is alert and oriented to person, place, and time.  Skin: Skin is warm and dry.  Psychiatric: She has a normal mood and affect. Her behavior is normal. Judgment and thought content normal.  Vitals reviewed.     BP 102/72   Pulse 96   Temp 98.8 F (37.1 C) (Oral)   Ht 5\' 5"  (1.651 m)   Wt 124 lb 6.4 oz (56.4 kg)   BMI 20.70 kg/m      Assessment & Plan:  1. Bilateral low back pain, with sciatica presence unspecified -Rest -ROM exercises discussed -Ice and heat as needed -RTO prn - Urinalysis, Complete - cyclobenzaprine (FLEXERIL) 5 MG tablet; Take 1 tablet (5 mg total) by mouth 3 (three) times daily as needed for muscle spasms.  Dispense: 60 tablet; Refill: 0 - naproxen (NAPROSYN) 500 MG tablet; Take 1 tablet (500 mg total) by mouth 2 (two) times daily with a meal.  Dispense: 60 tablet; Refill: 1   Patricia Rodneyhristy Davonn Flanery, FNP

## 2016-06-11 NOTE — Patient Instructions (Signed)

## 2016-06-25 ENCOUNTER — Other Ambulatory Visit: Payer: Self-pay

## 2016-06-25 MED ORDER — INSULIN LISPRO 100 UNIT/ML ~~LOC~~ SOLN: SUBCUTANEOUS | 0 refills | Status: DC

## 2016-06-28 ENCOUNTER — Telehealth: Payer: Self-pay | Admitting: Nurse Practitioner

## 2016-07-01 ENCOUNTER — Other Ambulatory Visit: Payer: Self-pay | Admitting: Family

## 2016-08-21 ENCOUNTER — Other Ambulatory Visit: Payer: Self-pay | Admitting: Family

## 2016-08-21 NOTE — Telephone Encounter (Signed)
Patient must make an appointment for diabetic follow up

## 2016-09-13 ENCOUNTER — Ambulatory Visit (INDEPENDENT_AMBULATORY_CARE_PROVIDER_SITE_OTHER): Payer: Commercial Managed Care - PPO | Admitting: Women's Health

## 2016-09-13 ENCOUNTER — Encounter: Payer: Self-pay | Admitting: Women's Health

## 2016-09-13 VITALS — BP 104/58 | HR 72 | Wt 124.0 lb

## 2016-09-13 DIAGNOSIS — Z3046 Encounter for surveillance of implantable subdermal contraceptive: Secondary | ICD-10-CM

## 2016-09-13 DIAGNOSIS — Z3202 Encounter for pregnancy test, result negative: Secondary | ICD-10-CM | POA: Diagnosis not present

## 2016-09-13 DIAGNOSIS — Z30017 Encounter for initial prescription of implantable subdermal contraceptive: Secondary | ICD-10-CM

## 2016-09-13 LAB — POCT URINE PREGNANCY: Preg Test, Ur: NEGATIVE

## 2016-09-13 NOTE — Patient Instructions (Signed)
Keep the area clean and dry.  You can remove the big bandage in 24 hours, and the small steri-strip bandage in 3-5 days.  A back up method, such as condoms, should be used for two weeks. You may have irregular vaginal bleeding for the first 6 months after the Nexplanon is placed, then the bleeding usually lightens and it is possible that you may not have any periods.  If you have any concerns, please give us a call.    Etonogestrel implant What is this medicine? ETONOGESTREL (et oh noe JES trel) is a contraceptive (birth control) device. It is used to prevent pregnancy. It can be used for up to 3 years. COMMON BRAND NAME(S): Implanon, Nexplanon What should I tell my health care provider before I take this medicine? They need to know if you have any of these conditions: -abnormal vaginal bleeding -blood vessel disease or blood clots -cancer of the breast, cervix, or liver -depression -diabetes -gallbladder disease -headaches -heart disease or recent heart attack -high blood pressure -high cholesterol -kidney disease -liver disease -renal disease -seizures -tobacco smoker -an unusual or allergic reaction to etonogestrel, other hormones, anesthetics or antiseptics, medicines, foods, dyes, or preservatives -pregnant or trying to get pregnant -breast-feeding How should I use this medicine? This device is inserted just under the skin on the inner side of your upper arm by a health care professional. Talk to your pediatrician regarding the use of this medicine in children. Special care may be needed. What if I miss a dose? This does not apply. What may interact with this medicine? Do not take this medicine with any of the following medications: -amprenavir -bosentan -fosamprenavir This medicine may also interact with the following medications: -barbiturate medicines for inducing sleep or treating seizures -certain medicines for fungal infections like ketoconazole and  itraconazole -griseofulvin -medicines to treat seizures like carbamazepine, felbamate, oxcarbazepine, phenytoin, topiramate -modafinil -phenylbutazone -rifampin -some medicines to treat HIV infection like atazanavir, indinavir, lopinavir, nelfinavir, tipranavir, ritonavir -St. John's wort What should I watch for while using this medicine? This product does not protect you against HIV infection (AIDS) or other sexually transmitted diseases. You should be able to feel the implant by pressing your fingertips over the skin where it was inserted. Contact your doctor if you cannot feel the implant, and use a non-hormonal birth control method (such as condoms) until your doctor confirms that the implant is in place. If you feel that the implant may have broken or become bent while in your arm, contact your healthcare provider. What side effects may I notice from receiving this medicine? Side effects that you should report to your doctor or health care professional as soon as possible: -allergic reactions like skin rash, itching or hives, swelling of the face, lips, or tongue -breast lumps -changes in emotions or moods -depressed mood -heavy or prolonged menstrual bleeding -pain, irritation, swelling, or bruising at the insertion site -scar at site of insertion -signs of infection at the insertion site such as fever, and skin redness, pain or discharge -signs of pregnancy -signs and symptoms of a blood clot such as breathing problems; changes in vision; chest pain; severe, sudden headache; pain, swelling, warmth in the leg; trouble speaking; sudden numbness or weakness of the face, arm or leg -signs and symptoms of liver injury like dark yellow or brown urine; general ill feeling or flu-like symptoms; light-colored stools; loss of appetite; nausea; right upper belly pain; unusually weak or tired; yellowing of the eyes or skin -unusual vaginal   bleeding, discharge -signs and symptoms of a stroke like  changes in vision; confusion; trouble speaking or understanding; severe headaches; sudden numbness or weakness of the face, arm or leg; trouble walking; dizziness; loss of balance or coordination Side effects that usually do not require medical attention (report to your doctor or health care professional if they continue or are bothersome): -acne -back pain -breast pain -changes in weight -dizziness -general ill feeling or flu-like symptoms -headache -irregular menstrual bleeding -nausea -sore throat -vaginal irritation or inflammation Where should I keep my medicine? This drug is given in a hospital or clinic and will not be stored at home.  2017 Elsevier/Gold Standard (2015-10-23 10:56:20)  

## 2016-09-13 NOTE — Progress Notes (Signed)
Patricia DuttonKristine Fox is a 25 y.o. year old G3P0212 Caucasian female here for Nexplanon removal and reinsertion.  She was given informed consent for removal and reinsertion of her Nexplanon. Her Nexplanon was placed Feb 2015, No LMP recorded. Patient has had an implant., and her pregnancy test today was neg. She understands she still has a few months before needs to be switched, however she prefers to go ahead and switch out today. Has never had a pap.    Risks/benefits/side effects of Nexplanon have been discussed and her questions have been answered.  Specifically, a failure rate of 10/998 has been reported, with an increased failure rate if pt takes St. John's Wort and/or antiseizure medicaitons.  Patricia DuttonKristine Fox is aware of the common side effect of irregular bleeding, which the incidence of decreases over time.  BP (!) 104/58 (BP Location: Right Arm, Patient Position: Sitting, Cuff Size: Normal)   Pulse 72   Wt 124 lb (56.2 kg)   BMI 20.63 kg/m  No LMP recorded. Patient has had an implant. Results for orders placed or performed in visit on 09/13/16 (from the past 24 hour(s))  POCT urine pregnancy   Collection Time: 09/13/16  2:05 PM  Result Value Ref Range   Preg Test, Ur Negative Negative     Appropriate time out taken. Nexplanon site identified.  Area prepped in usual sterile fashon. Two cc's of 2% lidocaine was used to anesthetize the area. A small stab incision was made right beside the implant on the distal portion.  The Nexplanon rod was grasped using hemostats and removed intact without difficulty.  The area was cleansed again with betadine and the Nexplanon was inserted per manufacturer's recommendations without difficulty.  Steri-strips and a pressure bandage was applied.  There was less than 3 cc blood loss. There were no complications.  The patient tolerated the procedure well.  She was instructed to keep the area clean and dry, remove pressure bandage in 24 hours, and keep insertion  site covered with the steri-strips for 3-5 days.  She was given a card indicating date Nexplanon was inserted and date it needs to be removed.   Follow-up 4wks for pap & physical  Marge DuncansBooker, Danyiel Crespin Randall CNM, [facility]NP[facility]BC 09/13/2016 2:38 PM

## 2016-09-14 ENCOUNTER — Telehealth: Payer: Self-pay | Admitting: Nurse Practitioner

## 2016-09-14 NOTE — Telephone Encounter (Signed)
Scheduled

## 2016-09-17 ENCOUNTER — Ambulatory Visit (INDEPENDENT_AMBULATORY_CARE_PROVIDER_SITE_OTHER): Payer: Commercial Managed Care - PPO | Admitting: *Deleted

## 2016-09-17 DIAGNOSIS — IMO0002 Reserved for concepts with insufficient information to code with codable children: Secondary | ICD-10-CM

## 2016-09-17 DIAGNOSIS — Z23 Encounter for immunization: Secondary | ICD-10-CM

## 2016-09-17 DIAGNOSIS — E1065 Type 1 diabetes mellitus with hyperglycemia: Secondary | ICD-10-CM

## 2016-09-17 LAB — BAYER DCA HB A1C WAIVED: HB A1C: 11.2 % — AB (ref <7.0)

## 2016-10-05 ENCOUNTER — Telehealth: Payer: Self-pay | Admitting: Nurse Practitioner

## 2016-10-05 ENCOUNTER — Emergency Department (HOSPITAL_COMMUNITY)
Admission: EM | Admit: 2016-10-05 | Discharge: 2016-10-05 | Disposition: A | Discharge status: Home / Self Care | Payer: Commercial Managed Care - PPO | Attending: Emergency Medicine | Admitting: Emergency Medicine

## 2016-10-05 ENCOUNTER — Emergency Department (HOSPITAL_COMMUNITY): Payer: Commercial Managed Care - PPO

## 2016-10-05 ENCOUNTER — Encounter (HOSPITAL_COMMUNITY): Payer: Self-pay

## 2016-10-05 DIAGNOSIS — E1065 Type 1 diabetes mellitus with hyperglycemia: Secondary | ICD-10-CM | POA: Insufficient documentation

## 2016-10-05 DIAGNOSIS — Z79899 Other long term (current) drug therapy: Secondary | ICD-10-CM | POA: Diagnosis not present

## 2016-10-05 DIAGNOSIS — R1010 Upper abdominal pain, unspecified: Secondary | ICD-10-CM

## 2016-10-05 DIAGNOSIS — R739 Hyperglycemia, unspecified: Secondary | ICD-10-CM

## 2016-10-05 DIAGNOSIS — R1013 Epigastric pain: Secondary | ICD-10-CM | POA: Diagnosis present

## 2016-10-05 DIAGNOSIS — F17210 Nicotine dependence, cigarettes, uncomplicated: Secondary | ICD-10-CM | POA: Diagnosis not present

## 2016-10-05 DIAGNOSIS — R52 Pain, unspecified: Secondary | ICD-10-CM

## 2016-10-05 LAB — COMPREHENSIVE METABOLIC PANEL
ALK PHOS: 62 U/L (ref 38–126)
ALT: 14 U/L (ref 14–54)
AST: 16 U/L (ref 15–41)
Albumin: 3.9 g/dL (ref 3.5–5.0)
Anion gap: 6 (ref 5–15)
BILIRUBIN TOTAL: 0.6 mg/dL (ref 0.3–1.2)
BUN: 13 mg/dL (ref 6–20)
CALCIUM: 8.4 mg/dL — AB (ref 8.9–10.3)
CO2: 24 mmol/L (ref 22–32)
CREATININE: 0.53 mg/dL (ref 0.44–1.00)
Chloride: 106 mmol/L (ref 101–111)
Glucose, Bld: 288 mg/dL — ABNORMAL HIGH (ref 65–99)
Potassium: 3.7 mmol/L (ref 3.5–5.1)
Sodium: 136 mmol/L (ref 135–145)
Total Protein: 6.2 g/dL — ABNORMAL LOW (ref 6.5–8.1)

## 2016-10-05 LAB — CBC WITH DIFFERENTIAL/PLATELET
Basophils Absolute: 0 10*3/uL (ref 0.0–0.1)
Basophils Relative: 0 %
EOS PCT: 3 %
Eosinophils Absolute: 0.1 10*3/uL (ref 0.0–0.7)
HCT: 40 % (ref 36.0–46.0)
HEMOGLOBIN: 13.9 g/dL (ref 12.0–15.0)
LYMPHS ABS: 1.1 10*3/uL (ref 0.7–4.0)
LYMPHS PCT: 20 %
MCH: 32 pg (ref 26.0–34.0)
MCHC: 34.8 g/dL (ref 30.0–36.0)
MCV: 92 fL (ref 78.0–100.0)
Monocytes Absolute: 0.7 10*3/uL (ref 0.1–1.0)
Monocytes Relative: 12 %
NEUTROS PCT: 65 %
Neutro Abs: 3.6 10*3/uL (ref 1.7–7.7)
Platelets: 198 10*3/uL (ref 150–400)
RBC: 4.35 MIL/uL (ref 3.87–5.11)
RDW: 11.7 % (ref 11.5–15.5)
WBC: 5.5 10*3/uL (ref 4.0–10.5)

## 2016-10-05 LAB — URINALYSIS, ROUTINE W REFLEX MICROSCOPIC
BILIRUBIN URINE: NEGATIVE
Glucose, UA: >=500 mg/dL — AB
Hgb urine dipstick: NEGATIVE
Ketones, ur: 15 mg/dL — AB
Leukocytes, UA: NEGATIVE
Nitrite: NEGATIVE
Specific Gravity, Urine: >1.03 — ABNORMAL HIGH (ref 1.005–1.030)
pH: 6 (ref 5.0–8.0)

## 2016-10-05 LAB — CBG MONITORING, ED: Glucose-Capillary: 258 mg/dL — ABNORMAL HIGH (ref 65–99)

## 2016-10-05 LAB — URINALYSIS, MICROSCOPIC (REFLEX): WBC, UA: NONE SEEN WBC/hpf (ref 0–5)

## 2016-10-05 LAB — LIPASE, BLOOD: LIPASE: 22 U/L (ref 11–51)

## 2016-10-05 LAB — PREGNANCY, URINE: PREG TEST UR: NEGATIVE

## 2016-10-05 MED ORDER — SODIUM CHLORIDE 0.9 % IV BOLUS (SEPSIS)
1000.0000 mL | Freq: Once | INTRAVENOUS | Status: AC
  Administered 2016-10-05: 1000 mL via INTRAVENOUS

## 2016-10-05 MED ORDER — MORPHINE SULFATE (PF) 4 MG/ML IV SOLN
4.0000 mg | Freq: Once | INTRAVENOUS | Status: AC
  Administered 2016-10-05: 4 mg via INTRAVENOUS
  Filled 2016-10-05: qty 1

## 2016-10-05 MED ORDER — ONDANSETRON 4 MG PO TBDP: 4.0000 mg | ORAL_TABLET | Freq: Three times a day (TID) | ORAL | 0 refills | Status: DC | PRN

## 2016-10-05 MED ORDER — ONDANSETRON HCL 4 MG/2ML IJ SOLN
4.0000 mg | Freq: Once | INTRAMUSCULAR | Status: AC
  Administered 2016-10-05: 4 mg via INTRAVENOUS
  Filled 2016-10-05: qty 2

## 2016-10-05 NOTE — ED Triage Notes (Signed)
My stomach has been hurting since Thursday night.  I started vomiting around 0230 this morning and my chest started hurting around 0200.  I had woke up really hot, and then I was nauseated per pt.

## 2016-10-05 NOTE — Telephone Encounter (Signed)
Appointment scheduled for 10/06/16 with Dr. Darlyn ReadStacks

## 2016-10-05 NOTE — ED Notes (Signed)
Pt given gingerale for PO challenge 

## 2016-10-05 NOTE — Discharge Instructions (Signed)
You were seen today for abdominal pain. Your workup is largely reassuring. He'll be discharged home with nausea medication. Return later today for ultrasound to rule out gallbladder disease.

## 2016-10-05 NOTE — ED Provider Notes (Signed)
[facility] DEPT Provider Note   CSN: [id number] Arrival date & time: 10/05/16  0334     History   Chief Complaint Chief Complaint  Patient presents with  . Abdominal Pain    HPI [name] is a 26 y.o. female.  HPI  This is a 26 year old female with a history of diabetes who presents with abdominal pain and vomiting. Patient reports onset of abdominal pain on Thursday. She reports epigastric pain that is sometimes sharp in nature. Current pain is 8 out of 10.  She states that it sometimes radiates into her chest. Does not seem to get any better or worse with food. This morning she woke up vomiting at 2:30. She reports nonbilious, nonbloody emesis. No diarrhea. Does have a history of DKA but states that this feels very different. No fevers but does report chills.  Past Medical History:  Diagnosis Date  . Bipolar disorder ([place])   . Candidiasis   . Corneal abrasion   . Diabetes mellitus    dx age 46  . Diabetes mellitus type I ([place])   . Diabetes mellitus type I ([place]) 01/19/2016  . IDDM (insulin dependent diabetes mellitus) ([place])    type 1  . Nexplanon insertion 11/07/2013   Inserted in left arm 11/07/13 to be removed 11/07/2016  . Pregnancy   . Pregnancy induced hypertension 2013  . Preterm labor 08/02/13   admitted to Antenatal 08/02/13  . Tubal pregnancy   . Urinary tract infection     Patient Active Problem List   Diagnosis Date Noted  . Bipolar I disorder ([place]) 03/12/2016  . Nexplanon insertion 11/07/2013  . DM (diabetes mellitus), type 1, uncontrolled ([place]) 01/26/2012  . Tobacco abuse 12/24/2011    Past Surgical History:  Procedure Laterality Date  . WISDOM TOOTH EXTRACTION  2012    OB History    Gravida Para Term Preterm AB Living   3 2 0 _0 SAB TAB Ectopic Multiple Live Births   0 0 1 0 2       Home Medications    Prior to Admission medications   Medication Sig Start Date End Date Taking? Authorizing Provider  acetone, urine, test strip  1 strip by Does not apply route as needed for high blood sugar. Use to check for ketones when BG is great than 300. 05/12/16   Cherre Robins, PharmD  Blood Glucose Monitoring Suppl (BAYER CONTOUR NEXT USB MONITOR) w/Device KIT Use to check BG 4-5 times per day 03/29/16   Mary-Margaret Hassell Done, FNP  cyclobenzaprine (FLEXERIL) 5 MG tablet Take 1 tablet (5 mg total) by mouth 3 (three) times daily as needed for muscle spasms. Patient not taking: Reported on 09/13/2016 06/11/16   Sharion Balloon, FNP  etonogestrel (NEXPLANON) 68 MG IMPL implant Inject 1 each into the skin once.    Historical Provider, MD  HUMALOG 100 UNIT/ML injection USE IN INSULIN PUMP AS  DIRECTED - CURRENT AVERAGE  DAILY DOSE =40 UNITS PER  DAY 08/21/16   Sharion Balloon, FNP  naproxen (NAPROSYN) 500 MG tablet Take 1 tablet (500 mg total) by mouth 2 (two) times daily with a meal. Patient not taking: Reported on 09/13/2016 06/11/16   Sharion Balloon, FNP  ondansetron (ZOFRAN ODT) 4 MG disintegrating tablet Take 1 tablet (4 mg total) by mouth every 8 (eight) hours as needed for nausea or vomiting. 10/05/16   Merryl Hacker, MD    Family History Family History  Problem Relation Age of  Onset  . Cancer Maternal Grandmother     breast  . Hypertension Maternal Grandmother   . Cancer Maternal Grandfather     throat and lung  . Anesthesia problems Neg Hx     Social History Social History  Substance Use Topics  . Smoking status: Current Some Day Smoker    Packs/day: 0.50    Years: 2.00    Types: Cigarettes  . Smokeless tobacco: Never Used     Comment: quit with + preg  . Alcohol use No     Allergies   Patient has no known allergies.   Review of Systems Review of Systems  Constitutional: Positive for chills. Negative for fever.  Respiratory: Negative for shortness of breath.   Cardiovascular: Negative for chest pain.  Gastrointestinal: Positive for abdominal pain, nausea and vomiting. Negative for diarrhea.  Genitourinary:  Negative for dysuria.  Musculoskeletal: Negative for back pain.  All other systems reviewed and are negative.    Physical Exam Updated Vital Signs BP 94/62   Pulse 85   Temp 97.8 F (36.6 C) (Oral)   Resp 17   Ht _0  (1.651 m)   Wt 125 lb (56.7 kg)   SpO2 96%   BMI 20.80 kg/m   Physical Exam  Constitutional: She is oriented to person, place, and time. She appears well-developed and well-nourished.  HENT:  Head: Normocephalic and atraumatic.  MM dry  Cardiovascular: Normal rate, regular rhythm and normal heart sounds.   Pulmonary/Chest: Effort normal and breath sounds normal. No respiratory distress. She has no wheezes.  Abdominal: Soft. Bowel sounds are normal. There is tenderness. There is no rebound and no guarding.  Epigastric tenderness to palpation without rebound or guarding  Neurological: She is alert and oriented to person, place, and time.  Skin: Skin is warm and dry.  Psychiatric: She has a normal mood and affect.  Nursing note and vitals reviewed.    ED Treatments / Results  Labs (all labs ordered are listed, but only abnormal results are displayed) Labs Reviewed  COMPREHENSIVE METABOLIC PANEL - Abnormal; Notable for the following:       Result Value   Glucose, Bld 288 (*)    Calcium 8.4 (*)    Total Protein 6.2 (*)    All other components within normal limits  URINALYSIS, ROUTINE W REFLEX MICROSCOPIC - Abnormal; Notable for the following:    Specific Gravity, Urine >1.030 (*)    Glucose, UA >=500 (*)    Ketones, ur 15 (*)    Protein, ur TRACE (*)    All other components within normal limits  URINALYSIS, MICROSCOPIC (REFLEX) - Abnormal; Notable for the following:    Bacteria, UA MANY (*)    Squamous Epithelial / LPF 0-5 (*)    All other components within normal limits  CBG MONITORING, ED - Abnormal; Notable for the following:    Glucose-Capillary 258 (*)    All other components within normal limits  CBC WITH DIFFERENTIAL/PLATELET  LIPASE, BLOOD    PREGNANCY, URINE    EKG  EKG Interpretation None       Radiology No results found.  Procedures Procedures (including critical care time)  Medications Ordered in ED Medications  sodium chloride 0.9 % bolus 1,000 mL (0 mLs Intravenous Stopped 10/05/16 0513)  ondansetron (ZOFRAN) injection 4 mg (4 mg Intravenous Given 10/05/16 0521)  morphine 4 MG/ML injection 4 mg (4 mg Intravenous Given 10/05/16 0522)     Initial Impression / Assessment and Plan / ED  Course  I have reviewed the triage vital signs and the nursing notes.  Pertinent labs & imaging results that were available during my care of the patient were reviewed by me and considered in my medical decision making (see chart for details).  Clinical Course     Presents with abdominal pain and vomiting. Ongoing for several days. She is nontoxic. She is hyperglycemic but no evidence of DKA. She is tender on exam without signs of peritonitis. Vital signs reassuring. Basic labwork including LFTs and lipase is also reassuring. Repeat exam she is tolerating fluids. Abdominal exam is relatively benign without signs of peritonitis. Discussed with patient further evaluation with ultrasound for her gallbladder. Initial plan was to have patient return later today; however, she has opted to stay until ultrasound gets here at 7:30.  If negative, feel patient to be discharged home with Zofran and PCP follow-up.  Final Clinical Impressions(s) / ED Diagnoses   Final diagnoses:  Pain of upper abdomen  Hyperglycemia    New Prescriptions New Prescriptions   ONDANSETRON (ZOFRAN ODT) 4 MG DISINTEGRATING TABLET    Take 1 tablet (4 mg total) by mouth every 8 (eight) hours as needed for nausea or vomiting.     Merryl Hacker, MD 10/05/16 [phone]

## 2016-10-05 NOTE — ED Notes (Signed)
ED Provider at bedside. 

## 2016-10-05 NOTE — ED Notes (Signed)
Pt tolerated PO challenge with no nausea or vomiting. Dr Wilkie AyeHorton made aware.

## 2016-10-05 NOTE — ED Provider Notes (Signed)
Discussed ultrasound results with patient. No acute abdomen.   Patricia HutchingBrian Kenshawn Maciolek, MD 10/05/16 [id number]

## 2016-10-06 ENCOUNTER — Ambulatory Visit (INDEPENDENT_AMBULATORY_CARE_PROVIDER_SITE_OTHER): Payer: Commercial Managed Care - PPO

## 2016-10-06 ENCOUNTER — Ambulatory Visit (INDEPENDENT_AMBULATORY_CARE_PROVIDER_SITE_OTHER): Payer: Commercial Managed Care - PPO | Admitting: Family Medicine

## 2016-10-06 VITALS — BP 100/68 | HR 78 | Temp 97.7°F | Ht 65.0 in | Wt 129.0 lb

## 2016-10-06 DIAGNOSIS — E1065 Type 1 diabetes mellitus with hyperglycemia: Secondary | ICD-10-CM

## 2016-10-06 DIAGNOSIS — R1013 Epigastric pain: Secondary | ICD-10-CM | POA: Diagnosis not present

## 2016-10-06 LAB — URINALYSIS, COMPLETE
BILIRUBIN UA: NEGATIVE
KETONES UA: NEGATIVE
Leukocytes, UA: NEGATIVE
Nitrite, UA: NEGATIVE
PH UA: 6 (ref 5.0–7.5)
PROTEIN UA: NEGATIVE
SPEC GRAV UA: 1.01 (ref 1.005–1.030)
UUROB: 0.2 mg/dL (ref 0.2–1.0)

## 2016-10-06 LAB — MICROSCOPIC EXAMINATION
Epithelial Cells (non renal): >10 /hpf — AB (ref 0–10)
Renal Epithel, UA: NONE SEEN /hpf

## 2016-10-06 MED ORDER — CIPROFLOXACIN HCL 500 MG PO TABS: 500.0000 mg | ORAL_TABLET | Freq: Two times a day (BID) | ORAL | 0 refills | Status: DC

## 2016-10-06 MED ORDER — PANTOPRAZOLE SODIUM 40 MG PO TBEC: 40.0000 mg | DELAYED_RELEASE_TABLET | Freq: Every day | ORAL | 11 refills | Status: DC

## 2016-10-06 NOTE — Progress Notes (Signed)
Subjective:  Patient ID: [name], female    DOB: June 06, 1991  Age: 26 y.o. MRN: [id number]  CC: Abdominal Pain (pt here today c/o stomach pain that is constant but after eating she feels worse, was seen at [facility] and given anti nausea meds.)   HPI [name] presents for Patient describes sharp pain in the epigastric and right upper quadrant. It is intermittent but getting more frequent. It is associated with nausea. No constipation or diarrhea. There is also dysuria and frequency. Onset 3-4 days ago.   History [name] has a past medical history of Bipolar disorder ([place]); Candidiasis; Corneal abrasion; Diabetes mellitus; Diabetes mellitus type I ([place]); Diabetes mellitus type I ([place]) (01/19/2016); IDDM (insulin dependent diabetes mellitus) ([place]); Nexplanon insertion (11/07/2013); Pregnancy; Pregnancy induced hypertension (2013); Preterm labor (08/02/13); Tubal pregnancy; and Urinary tract infection.   She has a past surgical history that includes Wisdom tooth extraction (2012).   Her family history includes Cancer in her maternal grandfather and maternal grandmother; Hypertension in her maternal grandmother.She reports that she has been smoking Cigarettes.  She has a 1.00 pack-year smoking history. She has never used smokeless tobacco. She reports that she does not drink alcohol or use drugs.    ROS Review of Systems  Constitutional: Negative for activity change, appetite change, chills, diaphoresis and fever.  HENT: Negative for congestion, rhinorrhea and sore throat.   Eyes: Negative for visual disturbance.  Respiratory: Negative for cough and shortness of breath.   Cardiovascular: Negative for chest pain and palpitations.  Gastrointestinal: Positive for abdominal pain and nausea. Negative for constipation, diarrhea, rectal pain and vomiting.  Genitourinary: Positive for dysuria, frequency and urgency. Negative for decreased urine volume, flank pain, hematuria,  menstrual problem and pelvic pain.  Musculoskeletal: Negative for arthralgias, joint swelling and myalgias.  Skin: Negative for rash.  Neurological: Negative for dizziness and numbness.    Objective:  BP 100/68   Pulse 78   Temp 97.7 F (36.5 C) (Oral)   Ht _0  (1.651 m)   Wt 129 lb (58.5 kg)   LMP 09/15/2016 (Approximate)   BMI 21.47 kg/m   BP Readings from Last 3 Encounters:  10/06/16 100/68  10/05/16 92/60  09/13/16 (!) 104/58    Wt Readings from Last 3 Encounters:  10/06/16 129 lb (58.5 kg)  10/05/16 125 lb (56.7 kg)  09/13/16 124 lb (56.2 kg)     Physical Exam  Constitutional: She is oriented to person, place, and time. She appears well-developed and well-nourished.  HENT:  Head: Normocephalic and atraumatic.  Cardiovascular: Normal rate and regular rhythm.   No murmur heard. Pulmonary/Chest: Effort normal and breath sounds normal.  Abdominal: Soft. Bowel sounds are normal. She exhibits no mass. There is tenderness (epigastric). There is no rebound and no guarding.  Neurological: She is alert and oriented to person, place, and time.  Skin: Skin is warm and dry.  Psychiatric: She has a normal mood and affect. Her behavior is normal.     Lab Results  Component Value Date   WBC 4.6 10/06/2016   HGB 13.9 10/05/2016   HCT 41.9 10/06/2016   PLT 227 10/06/2016   GLUCOSE 342 (H) 10/06/2016   CHOL 155 03/12/2016   TRIG 141 03/12/2016   HDL 34 (L) 03/12/2016   LDLDIRECT 88 11/19/2013   LDLCALC 93 03/12/2016   ALT 14 10/06/2016   AST 14 10/06/2016   NA 139 10/06/2016   K 4.9 10/06/2016   CL 98 10/06/2016   CREATININE  0.56 (L) 10/06/2016   BUN 7 10/06/2016   CO2 26 10/06/2016   TSH 0.992 03/26/2013   INR 0.98 05/24/2011   HGBA1C >14.0 11/26/2015   MICROALBUR neg 07/19/2014    US Abdomen Limited Ruq  Result Date: 10/05/2016 CLINICAL DATA:  Epigastric pain since Thursday with nausea and vomiting. EXAM: US ABDOMEN LIMITED - RIGHT UPPER QUADRANT  COMPARISON:  Liver MRI 09/29/2011 FINDINGS: Gallbladder: No gallstones or wall thickening visualized. No sonographic Murphy sign noted by sonographer. Common bile duct: Diameter: 3 mm.  Where visualized, no filling defect. Liver: There is approximately 3 cm subtle isoechoic mass in the left liver correlating with 09/29/2011 MRI. Elsewhere normal echogenicity. Antegrade flow in the imaged hepatic and portal venous system. IMPRESSION: 1. No acute finding.  Negative gallbladder. 2. Known left hepatic mass characterized by MRI in 2012. Electronically Signed   By: Monte Fantasia M.D.   On: 10/05/2016 08:09    Assessment & Plan:   [name] was seen today for abdominal pain.  Diagnoses and all orders for this visit:  Uncontrolled type 1 diabetes mellitus with hyperglycemia ([place])  Epigastric pain -     Urinalysis, Complete -     DG Abd 2 Views; Future -     CBC with Differential/Platelet -     CMP14+EGFR -     Lipase  Other orders -     pantoprazole (PROTONIX) 40 MG tablet; Take 1 tablet (40 mg total) by mouth daily. For stomach -     ciprofloxacin (CIPRO) 500 MG tablet; Take 1 tablet (500 mg total) by mouth 2 (two) times daily. -     Microscopic Examination   I have discontinued [name]'s cyclobenzaprine and naproxen. I am also having her start on pantoprazole and ciprofloxacin. Additionally, I am having her maintain her etonogestrel, BAYER CONTOUR NEXT USB MONITOR, acetone (urine) test, HUMALOG, and ondansetron.  Meds ordered this encounter  Medications  . pantoprazole (PROTONIX) 40 MG tablet    Sig: Take 1 tablet (40 mg total) by mouth daily. For stomach    Dispense:  30 tablet    Refill:  11  . ciprofloxacin (CIPRO) 500 MG tablet    Sig: Take 1 tablet (500 mg total) by mouth 2 (two) times daily.    Dispense:  14 tablet    Refill:  0     Follow-up: Return in about 2 weeks (around 10/20/2016).  Claretta Fraise, M.D.

## 2016-10-07 LAB — CBC WITH DIFFERENTIAL/PLATELET
BASOS ABS: 0 10*3/uL (ref 0.0–0.2)
BASOS: 1 %
EOS (ABSOLUTE): 0.1 10*3/uL (ref 0.0–0.4)
Eos: 2 %
HEMATOCRIT: 41.9 % (ref 34.0–46.6)
HEMOGLOBIN: 14.1 g/dL (ref 11.1–15.9)
IMMATURE GRANS (ABS): 0 10*3/uL (ref 0.0–0.1)
Immature Granulocytes: 0 %
LYMPHS ABS: 2 10*3/uL (ref 0.7–3.1)
Lymphs: 42 %
MCH: 32.3 pg (ref 26.6–33.0)
MCHC: 33.7 g/dL (ref 31.5–35.7)
MCV: 96 fL (ref 79–97)
MONOCYTES: 9 %
Monocytes Absolute: 0.4 10*3/uL (ref 0.1–0.9)
NEUTROS ABS: 2.1 10*3/uL (ref 1.4–7.0)
Neutrophils: 46 %
Platelets: 227 10*3/uL (ref 150–379)
RBC: 4.36 x10E6/uL (ref 3.77–5.28)
RDW: 12.6 % (ref 12.3–15.4)
WBC: 4.6 10*3/uL (ref 3.4–10.8)

## 2016-10-07 LAB — LIPASE: LIPASE: 26 U/L (ref 14–72)

## 2016-10-07 LAB — CMP14+EGFR
A/G RATIO: 2 (ref 1.2–2.2)
ALBUMIN: 4.3 g/dL (ref 3.5–5.5)
ALK PHOS: 70 IU/L (ref 39–117)
ALT: 14 IU/L (ref 0–32)
AST: 14 IU/L (ref 0–40)
BILIRUBIN TOTAL: 0.2 mg/dL (ref 0.0–1.2)
BUN/Creatinine Ratio: 13 (ref 9–23)
BUN: 7 mg/dL (ref 6–20)
CHLORIDE: 98 mmol/L (ref 96–106)
CO2: 26 mmol/L (ref 18–29)
Calcium: 9.7 mg/dL (ref 8.7–10.2)
Creatinine, Ser: 0.56 mg/dL — ABNORMAL LOW (ref 0.57–1.00)
GFR calc non Af Amer: 132 mL/min/{1.73_m2} (ref >59)
GFR, EST AFRICAN AMERICAN: 152 mL/min/{1.73_m2} (ref >59)
GLOBULIN, TOTAL: 2.2 g/dL (ref 1.5–4.5)
GLUCOSE: 342 mg/dL — AB (ref 65–99)
POTASSIUM: 4.9 mmol/L (ref 3.5–5.2)
SODIUM: 139 mmol/L (ref 134–144)
TOTAL PROTEIN: 6.5 g/dL (ref 6.0–8.5)

## 2016-10-12 ENCOUNTER — Other Ambulatory Visit: Payer: Commercial Managed Care - PPO | Admitting: Women's Health

## 2016-10-17 ENCOUNTER — Encounter: Payer: Self-pay | Admitting: Family Medicine

## 2016-11-22 ENCOUNTER — Ambulatory Visit: Payer: Commercial Managed Care - PPO | Admitting: Pediatrics

## 2016-11-23 ENCOUNTER — Ambulatory Visit: Payer: Commercial Managed Care - PPO | Admitting: Nurse Practitioner

## 2016-11-24 ENCOUNTER — Encounter: Payer: Self-pay | Admitting: Nurse Practitioner

## 2016-11-25 ENCOUNTER — Other Ambulatory Visit: Payer: Self-pay | Admitting: Family

## 2016-11-30 ENCOUNTER — Other Ambulatory Visit: Payer: Self-pay | Admitting: Advanced Practice Midwife

## 2016-11-30 ENCOUNTER — Telehealth: Payer: Self-pay | Admitting: *Deleted

## 2016-11-30 MED ORDER — MEGESTROL ACETATE 40 MG PO TABS: ORAL_TABLET | ORAL | 3 refills | Status: DC

## 2016-11-30 NOTE — Telephone Encounter (Signed)
Patient called with complaints of vaginal bleeding for 1 month. She is also having pain in her arm wit movement where the Nexplanon is placed. No swelling or signs of infection at injection site. Please advise.

## 2016-11-30 NOTE — Progress Notes (Signed)
Megace for btb on nexplanon

## 2016-11-30 NOTE — Telephone Encounter (Signed)
I sent megace for the bleeding, but it sounds like the rod has settled in an uncomfortable place.  IDK what to do about that except take it out and put a new one in

## 2016-12-01 NOTE — Telephone Encounter (Signed)
Please resend to [facility], not mail order. Thanks

## 2016-12-01 NOTE — Telephone Encounter (Signed)
Informed patient's mother that prescription for Megace was sent. Also told her that Drenda FreezeFran stated that the only other option was to take the implant out and replace it. Mother stated she wanted to check benefits.

## 2016-12-08 ENCOUNTER — Encounter: Payer: Self-pay | Admitting: Advanced Practice Midwife

## 2016-12-08 ENCOUNTER — Ambulatory Visit (INDEPENDENT_AMBULATORY_CARE_PROVIDER_SITE_OTHER): Payer: Commercial Managed Care - PPO | Admitting: Advanced Practice Midwife

## 2016-12-08 VITALS — BP 90/60 | HR 78 | Ht 65.0 in | Wt 125.0 lb

## 2016-12-08 DIAGNOSIS — N938 Other specified abnormal uterine and vaginal bleeding: Secondary | ICD-10-CM | POA: Diagnosis not present

## 2016-12-08 DIAGNOSIS — Z3009 Encounter for other general counseling and advice on contraception: Secondary | ICD-10-CM | POA: Diagnosis not present

## 2016-12-08 DIAGNOSIS — Z3042 Encounter for surveillance of injectable contraceptive: Secondary | ICD-10-CM

## 2016-12-08 DIAGNOSIS — Z3046 Encounter for surveillance of implantable subdermal contraceptive: Secondary | ICD-10-CM | POA: Diagnosis not present

## 2016-12-08 NOTE — Progress Notes (Signed)
[facility] Visit  Patient name: [name] MRN [id number]  Date of birth: Apr 17, 1991  CC & HPI:  [name] is a 26 y.o. Caucasian female presenting today for pain at nexplano site.  Had it replaced a few months ago.  Now she has pain at the site when she moves her arm and she moves it a lot w/work.  Taking megace for BTB, almost stopped bleeding.   Pertinent History Reviewed:  Medical & Surgical Hx:   Past Medical History:  Diagnosis Date  . Bipolar disorder ([place])   . Candidiasis   . Corneal abrasion   . Diabetes mellitus    dx age 56  . Diabetes mellitus type I ([place])   . Diabetes mellitus type I ([place]) 01/19/2016  . IDDM (insulin dependent diabetes mellitus) ([place])    type 1  . Nexplanon insertion 11/07/2013   Inserted in left arm 11/07/13 to be removed 11/07/2016  . Pregnancy   . Pregnancy induced hypertension 2013  . Preterm labor 08/02/13   admitted to Antenatal 08/02/13  . Tubal pregnancy   . Urinary tract infection    Past Surgical History:  Procedure Laterality Date  . WISDOM TOOTH EXTRACTION  2012   Family History  Problem Relation Age of Onset  . Cancer Maternal Grandmother     breast  . Hypertension Maternal Grandmother   . Cancer Maternal Grandfather     throat and lung  . Anesthesia problems Neg Hx     Current Outpatient Prescriptions:  .  acetone, urine, test strip, 1 strip by Does not apply route as needed for high blood sugar. Use to check for ketones when BG is great than 300., Disp: 25 each, Rfl: 2 .  Blood Glucose Monitoring Suppl (BAYER CONTOUR NEXT USB MONITOR) w/Device KIT, Use to check BG 4-5 times per day, Disp: 1 kit, Rfl: 0 .  etonogestrel (NEXPLANON) 68 MG IMPL implant, Inject 1 each into the skin once., Disp: , Rfl:  .  HUMALOG 100 UNIT/ML injection, USE IN INSULIN PUMP AS  DIRECTED 40 UNITS PER DAY, Disp: 40 mL, Rfl: 1 .  megestrol (MEGACE) 40 MG tablet, Take 3/day (at the same time) for 5 days; 2/day for 5 days, then 1/day  PO prn bleeding, Disp: 60 tablet, Rfl: 3 .  pantoprazole (PROTONIX) 40 MG tablet, Take 1 tablet (40 mg total) by mouth daily. For stomach, Disp: 30 tablet, Rfl: 11 Social History: Reviewed -  reports that she has been smoking Cigarettes.  She has a 1.00 pack-year smoking history. She has never used smokeless tobacco.  Review of Systems:   Constitutional: Negative for fever and chills Eyes: Negative for visual disturbances Respiratory: Negative for shortness of breath, dyspnea Cardiovascular: Negative for chest pain or palpitations  Gastrointestinal: Negative for vomiting, diarrhea and constipation; no abdominal pain Genitourinary: Negative for dysuria and urgency, vaginal irritation or itching Musculoskeletal: Negative for back pain, joint pain, myalgias  Neurological: Negative for dizziness and headaches    Objective Findings:    Physical Examination: General appearance - well appearing, and in no distress Mental status - alert, oriented to person, place, and time Chest:  Normal respiratory effort Heart - normal rate and regular rhythm Left arm:  Site looks perfectly fine and normal Abdomen:  Soft, nontender Musculoskeletal:  Normal range of motion without pain Extremities:  No edema  Only option I know of is to remove and replace Nexplanon in a different spot.  Pt wants to try that  No  results found for this or any previous visit (from the past 24 hour(s)).    Assessment & Plan:  A:   Pain at nexplanon site  P:     Return for order nexplanon; schedule removal/reinsertion for 3 weeks .  CRESENZO-DISHMAN,Angelyna Henderson CNM 12/08/2016 4:14 PM

## 2016-12-29 ENCOUNTER — Encounter: Payer: Self-pay | Admitting: Advanced Practice Midwife

## 2016-12-29 ENCOUNTER — Ambulatory Visit (INDEPENDENT_AMBULATORY_CARE_PROVIDER_SITE_OTHER): Payer: Commercial Managed Care - PPO | Admitting: Advanced Practice Midwife

## 2016-12-29 VITALS — BP 90/60 | HR 70 | Wt 123.0 lb

## 2016-12-29 DIAGNOSIS — Z30017 Encounter for initial prescription of implantable subdermal contraceptive: Secondary | ICD-10-CM | POA: Diagnosis not present

## 2016-12-29 DIAGNOSIS — Z3202 Encounter for pregnancy test, result negative: Secondary | ICD-10-CM | POA: Diagnosis not present

## 2016-12-29 LAB — POCT URINE PREGNANCY: Preg Test, Ur: NEGATIVE

## 2016-12-29 NOTE — Progress Notes (Signed)
[name] 25 y.o. Vitals:   12/29/16 1017  BP: 90/60  Pulse: 70    Past Medical History:  Diagnosis Date  . Bipolar disorder ([facility])   . Candidiasis   . Corneal abrasion   . Diabetes mellitus    dx age 26  . Diabetes mellitus type I ([facility])   . Diabetes mellitus type I ([facility]) 01/19/2016  . IDDM (insulin dependent diabetes mellitus) ([facility])    type 1  . Nexplanon insertion 11/07/2013   Inserted in left arm 11/07/13 to be removed 11/07/2016  . Pregnancy   . Pregnancy induced hypertension 2013  . Preterm labor 08/02/13   admitted to Antenatal 08/02/13  . Tubal pregnancy   . Urinary tract infection     Family History  Problem Relation Age of Onset  . Cancer Maternal Grandmother     breast  . Hypertension Maternal Grandmother   . Cancer Maternal Grandfather     throat and lung  . Anesthesia problems Neg Hx     Social History   Social History  . Marital status: Legally Separated    Spouse name: N/A  . Number of children: N/A  . Years of education: N/A   Occupational History  . Not on file.   Social History Main Topics  . Smoking status: Current Some Day Smoker    Packs/day: 0.50    Years: 2.00    Types: Cigarettes  . Smokeless tobacco: Never Used     Comment: quit with + preg  . Alcohol use No  . Drug use: No  . Sexual activity: Yes    Birth control/ protection: None, Condom, Implant   Other Topics Concern  . 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  She is having persistent pain at the tip of the insertion site of a Nexplanon that she had placed 3 m o nths ago. She was given informed consent for removal of her Nexplanon and reinsertion of a new one.A signed copy is in the chart.  Appropriate time out taken. Nexlanon site (left arm) identified and thea area was prepped in usual sterile fashon. 2 cc of 1% lidocaine was used to anesthetize  the area starting with the distal end of the implant. A small stab incision was made right beside the implant on the distal portion.  The Nexplanon rod was grasped using hemostats and removed without difficulty.  There was less than 3 cc blood loss. There were no complications. Next, the area just below the old site was cleansed again and the new Nexplanon was inserted without difficulty.  Steri strips and a pressure bandage were applied.  Pt was instructed to remove pressure bandage in a few hours, and keep insertion site covered with a bandaid for 3 days.  Follow-up scheduled PRN problems  Fox,Patricia Robers 12/29/2016 10:53 AM

## 2017-05-14 ENCOUNTER — Other Ambulatory Visit: Payer: Self-pay | Admitting: Family Medicine

## 2017-12-15 ENCOUNTER — Ambulatory Visit (INDEPENDENT_AMBULATORY_CARE_PROVIDER_SITE_OTHER): Payer: Commercial Managed Care - PPO | Admitting: Adult Health

## 2017-12-15 ENCOUNTER — Encounter: Payer: Self-pay | Admitting: Adult Health

## 2017-12-15 VITALS — BP 90/60 | HR 122 | Ht 65.0 in | Wt 124.5 lb

## 2017-12-15 DIAGNOSIS — Z3049 Encounter for surveillance of other contraceptives: Secondary | ICD-10-CM | POA: Diagnosis not present

## 2017-12-15 DIAGNOSIS — Z3046 Encounter for surveillance of implantable subdermal contraceptive: Secondary | ICD-10-CM
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
## 2017-12-15 NOTE — Patient Instructions (Signed)
Use condoms x 2 weeks, keep clean and dry x 24 hours, no heavy lifting, keep steri strips on x 72 hours, Keep pressure dressing on x 24 hours. Follow up prn problems.  

## 2017-12-24 ENCOUNTER — Emergency Department (HOSPITAL_COMMUNITY)
Admission: EM | Admit: 2017-12-24 | Discharge: 2017-12-24 | Disposition: A | Discharge status: Home / Self Care | Payer: Commercial Managed Care - PPO | Attending: Emergency Medicine | Admitting: Emergency Medicine

## 2017-12-24 ENCOUNTER — Encounter (HOSPITAL_COMMUNITY): Payer: Self-pay | Admitting: Emergency Medicine

## 2017-12-24 DIAGNOSIS — R000 Tachycardia, unspecified: Secondary | ICD-10-CM | POA: Diagnosis not present

## 2017-12-24 DIAGNOSIS — F17210 Nicotine dependence, cigarettes, uncomplicated: Secondary | ICD-10-CM | POA: Insufficient documentation

## 2017-12-24 DIAGNOSIS — Z79899 Other long term (current) drug therapy: Secondary | ICD-10-CM | POA: Insufficient documentation

## 2017-12-24 DIAGNOSIS — R1013 Epigastric pain: Secondary | ICD-10-CM

## 2017-12-24 DIAGNOSIS — R112 Nausea with vomiting, unspecified: Secondary | ICD-10-CM | POA: Diagnosis not present

## 2017-12-24 DIAGNOSIS — E109 Type 1 diabetes mellitus without complications: Secondary | ICD-10-CM | POA: Diagnosis not present

## 2017-12-24 DIAGNOSIS — Z794 Long term (current) use of insulin: Secondary | ICD-10-CM | POA: Diagnosis not present

## 2017-12-24 DIAGNOSIS — R1010 Upper abdominal pain, unspecified: Secondary | ICD-10-CM | POA: Diagnosis present

## 2017-12-24 LAB — CBC
HCT: 46.1 % — ABNORMAL HIGH (ref 36.0–46.0)
HEMOGLOBIN: 16 g/dL — AB (ref 12.0–15.0)
MCH: 32.6 pg (ref 26.0–34.0)
MCHC: 34.7 g/dL (ref 30.0–36.0)
MCV: 93.9 fL (ref 78.0–100.0)
Platelets: 250 10*3/uL (ref 150–400)
RBC: 4.91 MIL/uL (ref 3.87–5.11)
RDW: 12.2 % (ref 11.5–15.5)
WBC: 13.7 10*3/uL — AB (ref 4.0–10.5)

## 2017-12-24 LAB — COMPREHENSIVE METABOLIC PANEL
ALK PHOS: 70 U/L (ref 38–126)
ALT: 18 U/L (ref 14–54)
ANION GAP: 11 (ref 5–15)
AST: 22 U/L (ref 15–41)
Albumin: 3.8 g/dL (ref 3.5–5.0)
BUN: 16 mg/dL (ref 6–20)
CALCIUM: 8.6 mg/dL — AB (ref 8.9–10.3)
CO2: 21 mmol/L — ABNORMAL LOW (ref 22–32)
Chloride: 103 mmol/L (ref 101–111)
Creatinine, Ser: 0.53 mg/dL (ref 0.44–1.00)
GFR calc Af Amer: >60 mL/min (ref >60)
GFR calc non Af Amer: >60 mL/min (ref >60)
Glucose, Bld: 290 mg/dL — ABNORMAL HIGH (ref 65–99)
Potassium: 4.2 mmol/L (ref 3.5–5.1)
SODIUM: 135 mmol/L (ref 135–145)
TOTAL PROTEIN: 6.3 g/dL — AB (ref 6.5–8.1)
Total Bilirubin: 1 mg/dL (ref 0.3–1.2)

## 2017-12-24 LAB — I-STAT BETA HCG BLOOD, ED (MC, WL, AP ONLY): I-stat hCG, quantitative: <5 m[IU]/mL (ref <5)

## 2017-12-24 LAB — URINALYSIS, ROUTINE W REFLEX MICROSCOPIC
BILIRUBIN URINE: NEGATIVE
Bacteria, UA: NONE SEEN
HGB URINE DIPSTICK: NEGATIVE
KETONES UR: 80 mg/dL — AB
LEUKOCYTES UA: NEGATIVE
NITRITE: NEGATIVE
PH: 5 (ref 5.0–8.0)
Protein, ur: NEGATIVE mg/dL
Specific Gravity, Urine: 1.037 — ABNORMAL HIGH (ref 1.005–1.030)
Trans Epithel, UA: 1

## 2017-12-24 LAB — CBG MONITORING, ED: GLUCOSE-CAPILLARY: 282 mg/dL — AB (ref 65–99)

## 2017-12-24 LAB — TROPONIN I: Troponin I: <0.03 ng/mL (ref <0.03)

## 2017-12-24 LAB — LIPASE, BLOOD: Lipase: 25 U/L (ref 11–51)

## 2017-12-24 MED ORDER — ONDANSETRON HCL 4 MG/2ML IJ SOLN: 4.0000 mg | Freq: Once | INTRAMUSCULAR | Status: DC

## 2017-12-24 MED ORDER — ONDANSETRON 4 MG PO TBDP
4.0000 mg | ORAL_TABLET | Freq: Once | ORAL | Status: AC | PRN
  Administered 2017-12-24: 4 mg via ORAL
  Filled 2017-12-24: qty 1

## 2017-12-24 MED ORDER — METOCLOPRAMIDE HCL 5 MG/ML IJ SOLN
10.0000 mg | Freq: Once | INTRAMUSCULAR | Status: AC
  Administered 2017-12-24: 10 mg via INTRAVENOUS
  Filled 2017-12-24: qty 2

## 2017-12-24 MED ORDER — LACTATED RINGERS IV BOLUS (SEPSIS)
1000.0000 mL | Freq: Once | INTRAVENOUS | Status: AC
  Administered 2017-12-24: 1000 mL via INTRAVENOUS

## 2017-12-24 MED ORDER — LORAZEPAM 2 MG/ML IJ SOLN
1.0000 mg | Freq: Once | INTRAMUSCULAR | Status: AC
  Administered 2017-12-24: 1 mg via INTRAVENOUS
  Filled 2017-12-24: qty 1

## 2017-12-24 MED ORDER — METOCLOPRAMIDE HCL 10 MG PO TABS: 10.0000 mg | ORAL_TABLET | Freq: Three times a day (TID) | ORAL | 0 refills | Status: DC | PRN

## 2017-12-24 MED ORDER — HALOPERIDOL LACTATE 5 MG/ML IJ SOLN
2.0000 mg | Freq: Once | INTRAMUSCULAR | Status: AC
  Administered 2017-12-24: 2 mg via INTRAVENOUS
  Filled 2017-12-24: qty 1

## 2017-12-24 NOTE — ED Provider Notes (Addendum)
[facility] EMERGENCY DEPARTMENT Provider Note   CSN: [id number] Arrival date & time: 12/24/17  1335     History   Chief Complaint Chief Complaint  Patient presents with  . Abdominal Pain  . Chest Pain    HPI [name] is a 27 y.o. female.  27yo F w/ PMH including IDDM, bipolar d/o who p/w abdominal, chest, back, and head pain. She woke up at 6:40am with sharp, constant, severe upper abdominal pain. Shortly afterwards it radiated to her left chest and she began vomiting. CP is intermittent but abdominal pain is  Constant. She reports more recently developing right back pain. She feels short of breath but not as bad as when sx first started. She has not eaten today, water caused her to vomit. Normal BM today, no diarrhea or urinary sx. She has a right-sided headache.  The history is provided by the patient.  Abdominal Pain    Chest Pain   Associated symptoms include abdominal pain.    Past Medical History:  Diagnosis Date  . Bipolar disorder ([facility])   . Candidiasis   . Corneal abrasion   . Diabetes mellitus    dx age 23  . Diabetes mellitus type I ([facility])   . Diabetes mellitus type I ([facility]) 01/19/2016  . IDDM (insulin dependent diabetes mellitus) ([facility])    type 1  . Nexplanon insertion 11/07/2013   Inserted in left arm 11/07/13 to be removed 11/07/2016  . Pregnancy   . Pregnancy induced hypertension 2013  . Preterm labor 08/02/13   admitted to Antenatal 08/02/13  . Tubal pregnancy   . Urinary tract infection     Patient Active Problem List   Diagnosis Date Noted  . Bipolar I disorder ([facility]) 03/12/2016  . Nexplanon insertion 11/07/2013  . DM (diabetes mellitus), type 1, uncontrolled ([facility]) 01/26/2012  . Tobacco abuse 12/24/2011    Past Surgical History:  Procedure Laterality Date  . WISDOM TOOTH EXTRACTION  2012     OB History    Gravida  3   Para  2   Term  0   Preterm  2   AB  1   Living  2     SAB  0   TAB  0   Ectopic  1     Multiple  0   Live Births  2            Home Medications    Prior to Admission medications   Medication Sig Start Date End Date Taking? Authorizing Provider  glucagon 1 MG injection Follow package directions for low blood sugar. 06/08/17  Yes [provider]  HUMALOG 100 UNIT/ML injection USE IN INSULIN PUMP AS  DIRECTED 40 UNITS PER DAY Patient taking differently: USE IN INSULIN PUMP AS  DIRECTED 11/29/16  Yes Stacks, Broadus John, MD  Prenatal Vit-Fe Fumarate-FA (PRENATAL MULTIVITAMIN) TABS tablet Take 1 tablet by mouth daily at 12 noon.   Yes [provider]  metoCLOPramide (REGLAN) 10 MG tablet Take 1 tablet (10 mg total) by mouth every 8 (eight) hours as needed for nausea or vomiting (headache / nausea). 12/24/17   Jahara Dail, Ambrose Finland, MD    Family History Family History  Problem Relation Age of Onset  . Cancer Maternal Grandmother        breast  . Hypertension Maternal Grandmother   . Cancer Maternal Grandfather        throat and lung  . Anesthesia problems Neg Hx  Social History Social History   Tobacco Use  . Smoking status: Current Every Day Smoker    Packs/day: 0.50    Years: 2.00    Pack years: 1.00    Types: Cigarettes  . Smokeless tobacco: Never Used  . Tobacco comment: quit with + preg  Substance Use Topics  . Alcohol use: No  . Drug use: No     Allergies   Patient has no known allergies.   Review of Systems Review of Systems  Cardiovascular: Positive for chest pain.  Gastrointestinal: Positive for abdominal pain.   All other systems reviewed and are negative except that which was mentioned in HPI  Physical Exam Updated Vital Signs BP 103/66 (BP Location: Right Arm)   Pulse (!) 116   Temp 97.6 F (36.4 C) (Oral)   Resp 18   Ht 5\' 5"  (1.651 m)   Wt 57.6 kg (127 lb)   LMP 11/27/2017   SpO2 99%   BMI 21.13 kg/m   Physical Exam  Constitutional: She is oriented to person, place, and time. She appears well-developed  and well-nourished. No distress.  anxious  HENT:  Head: Normocephalic and atraumatic.  Moist mucous membranes  Eyes: Pupils are equal, round, and reactive to light. Conjunctivae are normal.  Neck: Neck supple.  Cardiovascular: Normal rate, regular rhythm and normal heart sounds.  No murmur heard. Pulmonary/Chest: Effort normal and breath sounds normal.  Abdominal: Soft. Bowel sounds are normal. She exhibits no distension. There is tenderness. There is no rebound and no guarding.  Generalized TTP worst in midepigastrium  Musculoskeletal: She exhibits no edema.  Neurological: She is alert and oriented to person, place, and time.  Fluent speech  Skin: Skin is warm and dry.  Psychiatric: Judgment normal.  anxious  Nursing note and vitals reviewed.    ED Treatments / Results  Labs (all labs ordered are listed, but only abnormal results are displayed) Labs Reviewed  COMPREHENSIVE METABOLIC PANEL - Abnormal; Notable for the following components:      Result Value   CO2 21 (*)    Glucose, Bld 290 (*)    Calcium 8.6 (*)    Total Protein 6.3 (*)    All other components within normal limits  CBC - Abnormal; Notable for the following components:   WBC 13.7 (*)    Hemoglobin 16.0 (*)    HCT 46.1 (*)    All other components within normal limits  URINALYSIS, ROUTINE W REFLEX MICROSCOPIC - Abnormal; Notable for the following components:   APPearance HAZY (*)    Specific Gravity, Urine 1.037 (*)    Glucose, UA >=500 (*)    Ketones, ur 80 (*)    Squamous Epithelial / LPF 6-30 (*)    All other components within normal limits  CBG MONITORING, ED - Abnormal; Notable for the following components:   Glucose-Capillary 282 (*)    All other components within normal limits  LIPASE, BLOOD  TROPONIN I  I-STAT BETA HCG BLOOD, ED ([facility], [facility], [facility] ONLY)    EKG EKG Interpretation  Date/Time:  Saturday December 24 2017 14:16:52 EDT Ventricular Rate:  106 PR Interval:  116 QRS Duration: 72 QT  Interval:  324 QTC Calculation: 430 R Axis:   86 Text Interpretation:  Sinus tachycardia Otherwise normal ECG No significant change since last tracing Confirmed by Frederick PeersLittle, Dael Howland [phone]([id number]) on 12/24/2017 4:02:55 PM   Radiology No results found.  Procedures Procedures (including critical care time)  Medications Ordered in ED Medications  ondansetron (  ZOFRAN-ODT) disintegrating tablet 4 mg (4 mg Oral Given 12/24/17 1414)  lactated ringers bolus 1,000 mL (0 mLs Intravenous Stopped 12/24/17 1730)  metoCLOPramide (REGLAN) injection 10 mg (10 mg Intravenous Given 12/24/17 1703)  haloperidol lactate (HALDOL) injection 2 mg (2 mg Intravenous Given 12/24/17 1744)  LORazepam (ATIVAN) injection 1 mg (1 mg Intravenous Given 12/24/17 2024)     Initial Impression / Assessment and Plan / ED Course  I have reviewed the triage vital signs and the nursing notes.  Pertinent labs & imaging results that were available during my care of the patient were reviewed by me and considered in my medical decision making (see chart for details).     PT anxious but well-appearing on exam.  She had generalized abdominal tenderness that was worst in the mid epigastrium.  Her pain and vomiting resolved after a dose of Reglan and later Haldol.  Her lab work showed hyperglycemia without DKA, no evidence of infection on UA, normal LFTs and lipase.  Given that her symptoms resolved with these medications, I do not feel she needs any abdominal imaging. Her description of chest pain is more focused on abdominal pain that radiated into chest and also down abdomen, given young age I doubt aortic dissection and sx resolved without pain medications. She denies recent travel and has no risk factors for PE. She became more tachycardic and jittery towards the end of her ED course and I am concerned about the possibility of substance withdrawal although she denies. She wanted to go home and felt well, was tolerating PO. Heart rate improved when  I wasn't in the room and also after ativan. She understands return precautions. Well appearing and ambulatory at time of discharge.  Final Clinical Impressions(s) / ED Diagnoses   Final diagnoses:  Non-intractable vomiting with nausea, unspecified vomiting type  Epigastric pain  Sinus tachycardia    ED Discharge Orders        Ordered    metoCLOPramide (REGLAN) 10 MG tablet  Every 8 hours PRN     12/24/17 2100       Mylik Pro, Ambrose Finland, MD 12/24/17 2301    Clarene Duke, Ambrose Finland, MD 12/24/17 2303

## 2017-12-24 NOTE — ED Triage Notes (Signed)
Pt states she woke up this morning with upper, central abdominal pain and threw up immediately after she tried to drink water. Nauseated at present. Pain 8/10.

## 2017-12-24 NOTE — ED Notes (Signed)
Pt discharged from ED; instructions provided and scripts given; Pt encouraged to return to ED if symptoms worsen and to f/u with PCP; Pt verbalized understanding of all instructions 

## 2018-03-24 IMAGING — DX DG ABDOMEN 2V
2 series · 2 of 2 positions shown · non-contrast
Comparison: No recent prior.

CLINICAL DATA: Abdominal pain.  Vomiting.

EXAM:
ABDOMEN - 2 VIEW

[abdomen erect]
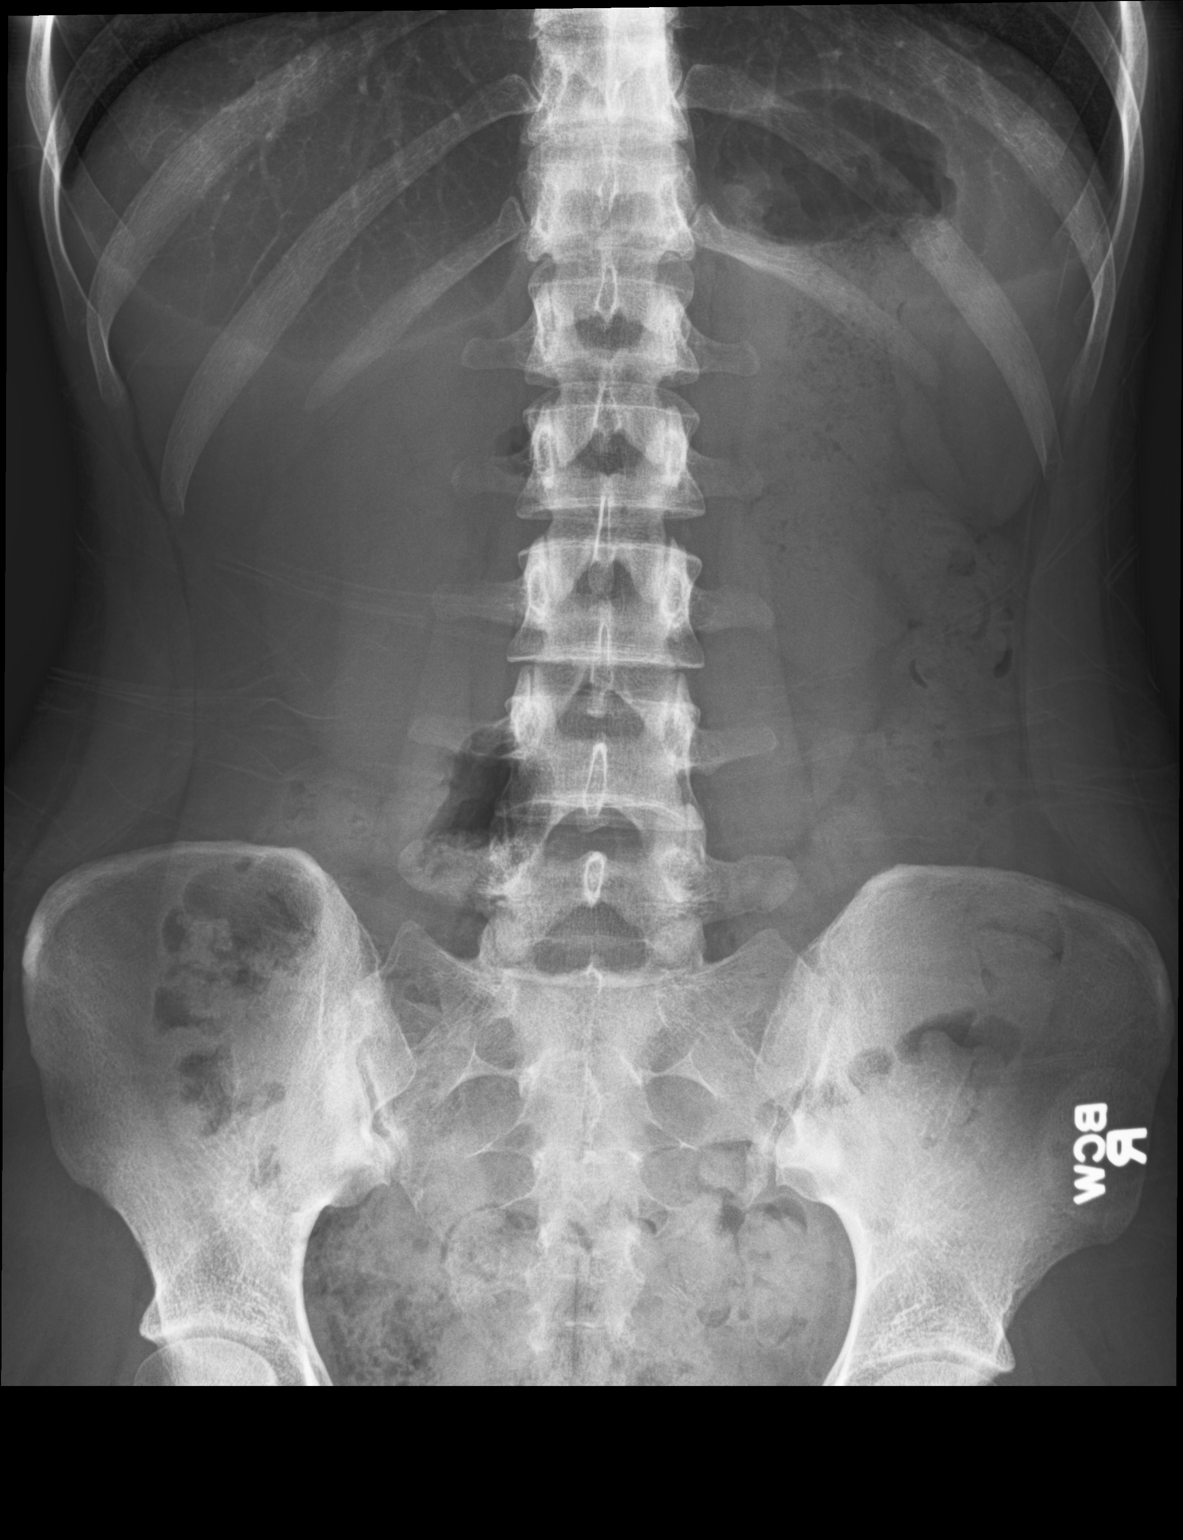

[abdomen supine]
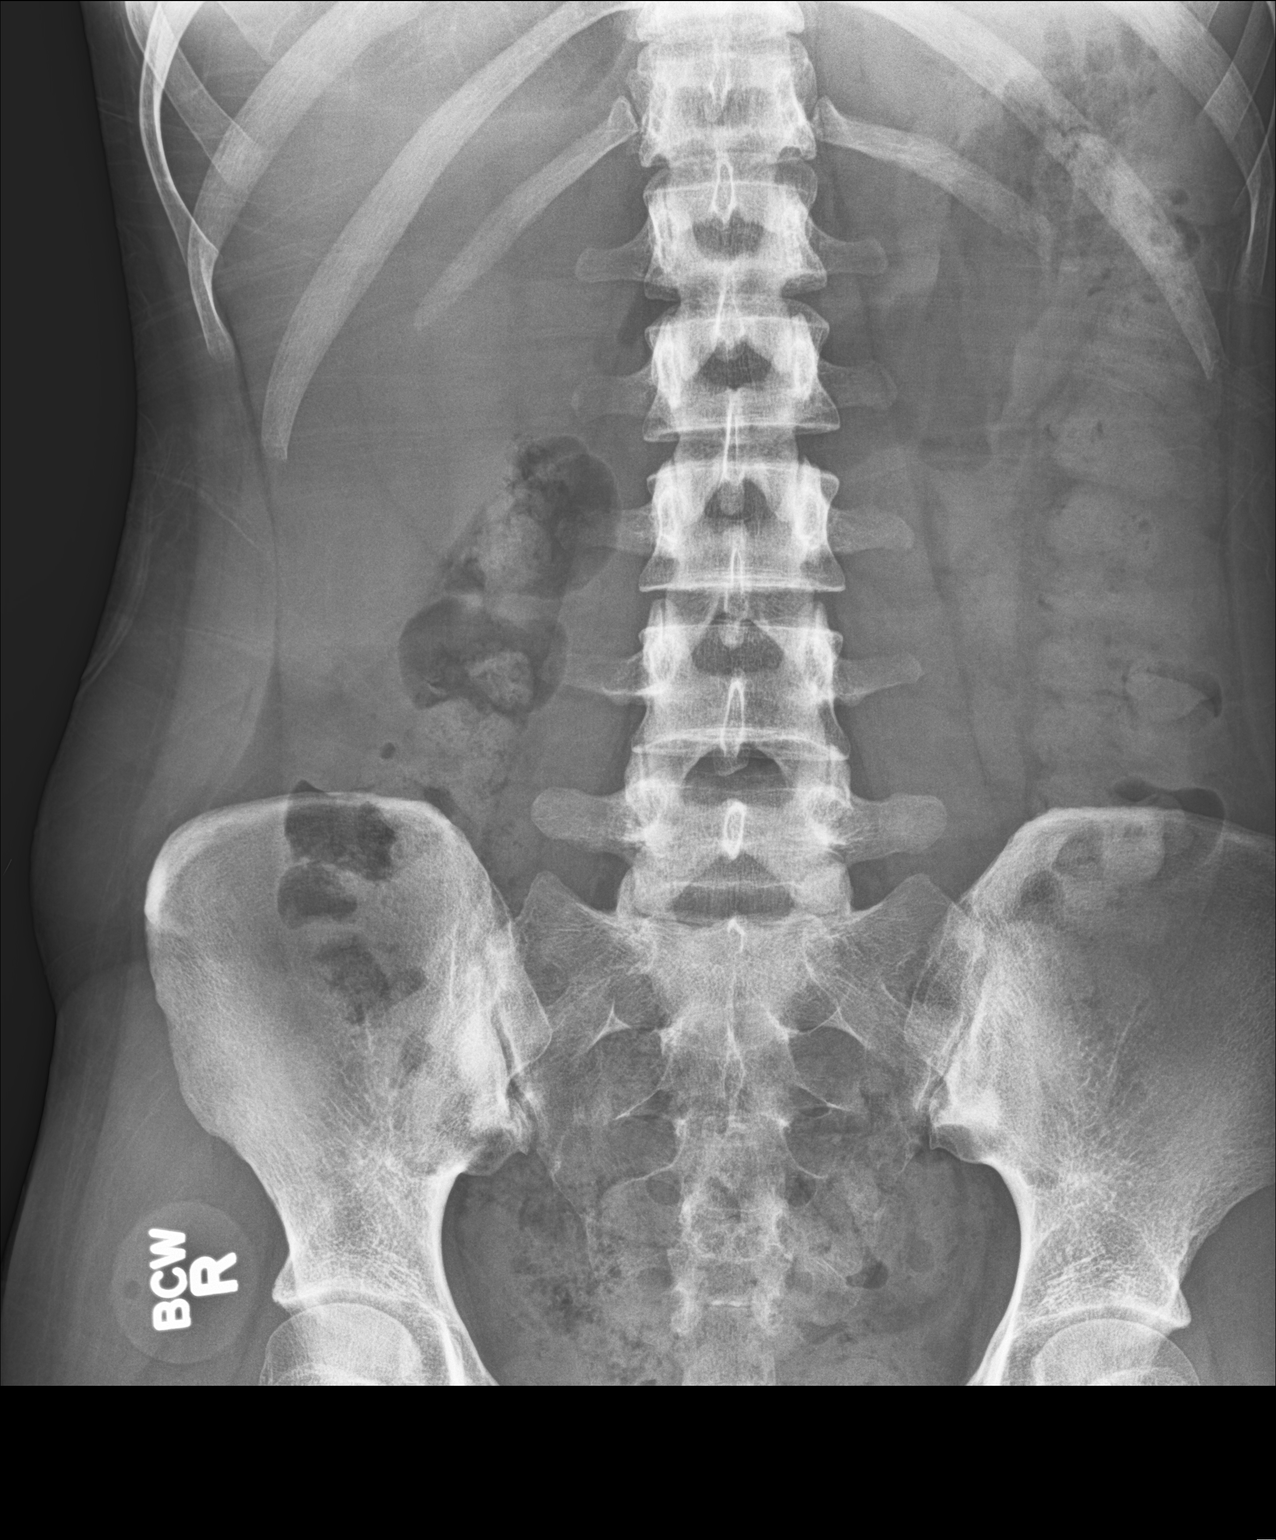

[2 of 2 positions shown; findings below may reference images not displayed]

FINDINGS: Soft tissue structures are unremarkable. Prominent amount of stool
noted throughout the colon suggesting constipation. No bowel
distention. No free air. No acute bony abnormality .
IMPRESSION: Prior amount of stool noted throughout the colon suggesting
constipation.

## 2018-12-13 ENCOUNTER — Ambulatory Visit (INDEPENDENT_AMBULATORY_CARE_PROVIDER_SITE_OTHER): Payer: Commercial Managed Care - PPO

## 2018-12-13 ENCOUNTER — Other Ambulatory Visit: Payer: Self-pay | Admitting: Obstetrics & Gynecology

## 2018-12-13 ENCOUNTER — Other Ambulatory Visit: Payer: Self-pay

## 2018-12-13 DIAGNOSIS — Z3A08 8 weeks gestation of pregnancy: Secondary | ICD-10-CM | POA: Diagnosis not present

## 2018-12-13 DIAGNOSIS — O3680X0 Pregnancy with inconclusive fetal viability, not applicable or unspecified: Secondary | ICD-10-CM

## 2018-12-13 NOTE — Progress Notes (Signed)
[place] 6+5 wks,single IUP w/ys,positive fht 102 bpm,normal ovaries bilat,crl 8.10 mm

## 2018-12-18 ENCOUNTER — Telehealth: Payer: Self-pay | Admitting: Women's Health

## 2018-12-18 ENCOUNTER — Telehealth: Payer: Self-pay | Admitting: *Deleted

## 2018-12-18 NOTE — Telephone Encounter (Signed)
Please call pt has a New Ob appt on the 20th having some issues she needs to discuss

## 2018-12-18 NOTE — Telephone Encounter (Signed)
Patient states she started having some light pink spotting along with brown blood this weekend.  She is also experiencing a sharp, pain in her left lower quadrant. States she has had intercourse sine the bleeding has started.  Informed patient that brown is old blood and to avoid intercourse for 7-10 days until bleeding stops.  Encouraged to push fluids and empty bladder frequently. Verbalized understanding with no further questions.

## 2018-12-22 ENCOUNTER — Other Ambulatory Visit: Payer: Self-pay | Admitting: Women's Health

## 2018-12-22 ENCOUNTER — Ambulatory Visit (INDEPENDENT_AMBULATORY_CARE_PROVIDER_SITE_OTHER): Payer: Commercial Managed Care - PPO | Admitting: Women's Health

## 2018-12-22 ENCOUNTER — Ambulatory Visit: Payer: Commercial Managed Care - PPO | Admitting: *Deleted

## 2018-12-22 ENCOUNTER — Other Ambulatory Visit: Payer: Self-pay

## 2018-12-22 ENCOUNTER — Encounter: Payer: Self-pay | Admitting: Women's Health

## 2018-12-22 ENCOUNTER — Other Ambulatory Visit (INDEPENDENT_AMBULATORY_CARE_PROVIDER_SITE_OTHER): Payer: Commercial Managed Care - PPO

## 2018-12-22 VITALS — BP 103/75 | HR 73

## 2018-12-22 DIAGNOSIS — O021 Missed abortion: Secondary | ICD-10-CM | POA: Diagnosis not present

## 2018-12-22 DIAGNOSIS — O09211 Supervision of pregnancy with history of pre-term labor, first trimester: Secondary | ICD-10-CM

## 2018-12-22 DIAGNOSIS — Z3A08 8 weeks gestation of pregnancy: Secondary | ICD-10-CM

## 2018-12-22 DIAGNOSIS — O3680X0 Pregnancy with inconclusive fetal viability, not applicable or unspecified: Secondary | ICD-10-CM

## 2018-12-22 DIAGNOSIS — O0990 Supervision of high risk pregnancy, unspecified, unspecified trimester: Secondary | ICD-10-CM

## 2018-12-22 DIAGNOSIS — Z8759 Personal history of other complications of pregnancy, childbirth and the puerperium: Secondary | ICD-10-CM | POA: Insufficient documentation

## 2018-12-22 DIAGNOSIS — O09891 Supervision of other high risk pregnancies, first trimester: Secondary | ICD-10-CM | POA: Insufficient documentation

## 2018-12-22 NOTE — Progress Notes (Signed)
[place] 8 wks,single IUP,CRL 16.27 mm,GS 20.2 mm= 6+6 wks,no FHT,normal ovaries bilat,Kim discussed results with pt.

## 2018-12-22 NOTE — Patient Instructions (Signed)
FACTS YOU SHOULD KNOW  °About Early Pregnancy Loss ° °WHAT IS AN EARLY PREGNANCY LOSS? °Once the egg is fertilized with the sperm and begins to develop, it attaches to the lining of the uterus. This early pregnancy tissue may not develop into an embryo (the beginning stage of a baby). Sometimes an embryo does develop but does not continue to grow. These problems can be seen on ultrasound.  °Many women experience the loss of a pregnancy during their lifetime.  As many as 10-30% of pregnancies end in pregnancy loss within the first trimester (or first 12-14 weeks). ° °MANAGEMNT OF EARLY PREGNANCY LOSS: °There are 3 ways to care for an early pregnancy loss:   °(1) Surgery, (2) Medicine, (3) Waiting for you to pass the pregnancy on your own. °The decision as to how to proceed after being diagnosed with and early pregnancy loss is an individual one.  The decision can be made only after appropriate counseling.  You need to weigh the pros and cons of the 3 choices. Then you can make the choice that works for you. ° °SURGERY (D&E) °• Procedure over in 1 day °• Requires being put to sleep °• Bleeding may be light °• Possible problems during surgery, including injury to womb(uterus) °• Care provider has more control °Medicine (CYTOTEC) °• The complete procedure may take days to weeks °• No Surgery °• Bleeding may be heavy at times °• There may be drug side effects °• Patient has more control °Waiting °• You may choose to wait, in which case your own body may complete the passing of the abnormal early pregnancy on its own in about 2-4 weeks °• Your bleeding may be heavy at times °• There is a small possibility that you may need surgery if the bleeding is too much or not all of the pregnancy has passed. ° °CYTOTEC MANAGEMENT °Prostaglandins (cytotec) are the most widely used drug for this purpose. They cause the uterus to cramp and contract. You will place the medicine yourself inside your vagina in the privacy of your home.  Empting of the uterus should occur within 3 days but the process may continue for several weeks. The bleeding may seem heavy at times. ° °INSTRUCTIONS: Take all 4 tablets of cytotec (800mcg total) at one time. This will cause a lot of cramping, you may have bleeding, and pass tissue, then the cramping and bleeding should get better. If you do not pass the tissue, then you can take 4 more tablets of cytotec (800mcg total) 48 hours after your first dose.  You will come back to have your blood drawn to make sure the pregnancy hormones are dropping in 1 week. Please call us if you have any questions.  ° °POSSIBLE SIDE EFFECTS FROM CYTOTEC °• Nausea  Vomiting °• Diarrhea Fever °• Chills  Hot Flashes °Side effects  from the process of the early pregnancy loss include: °• Cramping  Bleeding °• Headaches  Dizziness °RISKS: °This is a low risk procedure. Less than 1 in 100 women has a complication. An incomplete passage of the early pregnancy may occur. Also, hemorrhage (heavy bleeding) could happen.  Rarely the pregnancy will not be passed completely. Excessively heavy bleeding may occur.  Your doctor may need to perform surgery to empty the uterus (D&E). °Afterwards: °Everybody will feel differently after the early pregnancy loss completion. You may have soreness or cramps for a day or two. You may have soreness or cramps for day or two.  You may have   light bleeding for up to 2 weeks. You may be as active as you feel like being. °If you have any of the following problems you may call [facility] at [phone] or [facility] at [phone] if it is after hours. °• If you have pain that does not get better with pain medication °• Bleeding that soaks through 2 thick full-sized sanitary pads in an hour °• Cramps that last longer than 2 days °• Foul smelling discharge °• Fever above 100.4 degrees F °Even if you do not have any of these symptoms, you should have a follow-up exam to make sure you are healing  properly. Your next normal period will usually start again in 4-6 week after the loss. You can get pregnant soon after the loss, so use birth control right away. °Finally: °Make sure all your questions are answered before during and after any procedure. Follow up with medical care and family planning methods. ° °  ° ° °

## 2018-12-22 NOTE — Progress Notes (Signed)
   GYN VISIT Patient name: [name] MRN [id number]  Date of birth: 1991-08-02 Chief Complaint:   Initial Prenatal Visit (bleeding for a week)  History of Present Illness:   [name] is a 28 y.o. [phone] Caucasian female at [redacted]w[redacted]d , initially here for new ob, however she has been bleeding for 1 week. Informal u/s unable to identify FCA, formal u/s w/ amber confirms missed Ab. CRL 8wks, GS [redacted]w[redacted]d. Not much cramping, light bleeding. T1DM, uncontrolled, last A1C 14.  Patient's last menstrual period was 10/06/2018 (exact date). Review of Systems:   Pertinent items are noted in HPI Denies fever/chills, dizziness, headaches, visual disturbances, fatigue, shortness of breath, chest pain, abdominal pain, vomiting, abnormal vaginal discharge/itching/odor/irritation, problems with periods, bowel movements, urination, or intercourse unless otherwise stated above.  Pertinent History Reviewed:  Reviewed past medical,surgical, social, obstetrical and family history.  Reviewed problem list, medications and allergies. Physical Assessment:   Vitals:   12/22/18 1205  BP: 103/75  Pulse: 73  There is no height or weight on file to calculate BMI.       Physical Examination:   General appearance: alert, well appearing, and in no distress  Mental status: alert, oriented to person, place, and time  Skin: warm & dry   Cardiovascular: normal heart rate noted  Respiratory: normal respiratory effort, no distress  Abdomen: soft, non-tender   Pelvic: examination not indicated  Extremities: no edema   Today's [place] 8 wks,single IUP,CRL 16.27 mm,GS 20.2 mm= 6+6 wks,no FHT,normal ovaries bilat,Kim discussed results with pt.  No results found for this or any previous visit (from the past 24 hour(s)).  Assessment & Plan:  1) Missed Ab>[redacted]w[redacted]d by 6wk u/s, today's CRL c/w [redacted]w[redacted]d w/o FCA, GS [redacted]w[redacted]d. Discussed options of expectant management vs. cytotec vs. D&C. Discussed what to expect with miscarriage as well as warning  s/s to report, reasons to seek care. Printed information given about all options. Pt prefers to go home and think about it. Will check BHCG today, and follow BHCGs weekly until <5.  F/U in 1 week. Rh+  Meds: No orders of the defined types were placed in this encounter.   Orders Placed This Encounter  Procedures  . Beta hCG quant (ref lab)    Return in about 1 week (around 12/29/2018) for F/U.  Cheral Marker CNM, [facility] 12/22/2018 2:30 PM

## 2018-12-23 LAB — BETA HCG QUANT (REF LAB): HCG QUANT: 12199 m[IU]/mL

## 2018-12-28 ENCOUNTER — Telehealth: Payer: Self-pay | Admitting: *Deleted

## 2018-12-28 NOTE — Telephone Encounter (Signed)
Patient informed that we are not allowing visitors or children to come to appointments at this time. Patient denies any contact with anyone suspected or confirmed of having COVID-19. Pt denies fever, cough, sob, muscle pain, diarrhea, rash, vomiting, abdominal pain, red eye, weakness, bruising or bleeding, joint pain or severe headache.  

## 2018-12-29 ENCOUNTER — Encounter: Payer: Self-pay | Admitting: Obstetrics & Gynecology

## 2018-12-29 ENCOUNTER — Ambulatory Visit (INDEPENDENT_AMBULATORY_CARE_PROVIDER_SITE_OTHER): Payer: Commercial Managed Care - PPO | Admitting: Obstetrics & Gynecology

## 2018-12-29 ENCOUNTER — Other Ambulatory Visit: Payer: Self-pay

## 2018-12-29 VITALS — BP 116/81 | HR 101 | Wt 124.0 lb

## 2018-12-29 DIAGNOSIS — O021 Missed abortion: Secondary | ICD-10-CM | POA: Diagnosis not present

## 2018-12-29 MED ORDER — MISOPROSTOL 200 MCG PO TABS: 800.0000 ug | ORAL_TABLET | Freq: Once | ORAL | 1 refills | Status: DC

## 2018-12-29 NOTE — Progress Notes (Signed)
Chief Complaint  Patient presents with  . Follow-up    SAB      28 y.o. [id number] Patient's last menstrual period was 10/06/2018 (exact date). The current method of family planning is none.  Outpatient Encounter Medications as of 12/29/2018  Medication Sig  . Continuous Blood Gluc Sensor (FREESTYLE LIBRE 14 DAY SENSOR) MISC by Does not apply route.  Marland Kitchen glucagon 1 MG injection Follow package directions for low blood sugar.  Marland Kitchen HUMALOG 100 UNIT/ML injection USE IN INSULIN PUMP AS  DIRECTED 40 UNITS PER DAY (Patient taking differently: USE IN INSULIN PUMP AS  DIRECTED)  . Insulin Degludec 200 UNIT/ML SOPN Inject into the skin.  . misoprostol (CYTOTEC) 200 MCG tablet Take 4 tablets (800 mcg total) by mouth once for 1 dose.  . Prenatal Vit-Fe Fumarate-FA (PRENATAL MULTIVITAMIN) TABS tablet Take 1 tablet by mouth daily at 12 noon.   No facility-administered encounter medications on file as of 12/29/2018.     Subjective POC sonogram performed by me reveals CRL 12 mm, smaller with no FHR seen Confirming sonogram 1 week ago, pt has had minimal spotting Past Medical History:  Diagnosis Date  . Bipolar disorder (HCC)   . Candidiasis   . Corneal abrasion   . Diabetes mellitus    dx age 88  . Diabetes mellitus type I (HCC)   . Diabetes mellitus type I (HCC) 01/19/2016  . IDDM (insulin dependent diabetes mellitus) (HCC)    type 1  . Nexplanon insertion 11/07/2013   Inserted in left arm 11/07/13 to be removed 11/07/2016  . Pregnancy   . Pregnancy induced hypertension 2013  . Preterm labor 08/02/13   admitted to Antenatal 08/02/13  . Tubal pregnancy   . Urinary tract infection     Past Surgical History:  Procedure Laterality Date  . WISDOM TOOTH EXTRACTION  2012    OB History as of 12/22/2018    Gravida  4   Para  2   Term  0   Preterm  2   AB  1   Living  2     SAB  0   TAB  0   Ectopic  1   Multiple  0   Live Births  2           No Known Allergies   Social History   Socioeconomic History  . Marital status: Legally Separated    Spouse name: Not on file  . Number of children: Not on file  . Years of education: Not on file  . Highest education level: Not on file  Occupational History  . Not on file  Social Needs  . Financial resource strain: Not on file  . Food insecurity:    Worry: Not on file    Inability: Not on file  . Transportation needs:    Medical: Not on file    Non-medical: Not on file  Tobacco Use  . Smoking status: Current Every Day Smoker    Packs/day: 0.25    Years: 2.00    Pack years: 0.50    Types: Cigarettes  . Smokeless tobacco: Never Used  Substance and Sexual Activity  . Alcohol use: No  . Drug use: No  . Sexual activity: Yes    Birth control/protection: None  Lifestyle  . Physical activity:    Days per week: Not on file    Minutes per session: Not on file  . Stress: Not on file  Relationships  .  Social connections:    Talks on phone: Not on file    Gets together: Not on file    Attends religious service: Not on file    Active member of club or organization: Not on file    Attends meetings of clubs or organizations: Not on file    Relationship status: Not on file  Other Topics Concern  . Not on file  Social History Narrative   SINGLE MOM   ONE CHILD   GRADUATED HIGH SCHOOL    Family History  Problem Relation Age of Onset  . Cancer Maternal Grandmother        breast  . Hypertension Maternal Grandmother   . Cancer Maternal Grandfather        throat and lung  . Asthma Mother   . Asthma Brother   . Anesthesia problems Neg Hx     Medications:       Current Outpatient Medications:  .  Continuous Blood Gluc Sensor (FREESTYLE LIBRE 14 DAY SENSOR) MISC, by Does not apply route., Disp: , Rfl:  .  glucagon 1 MG injection, Follow package directions for low blood sugar., Disp: , Rfl:  .  HUMALOG 100 UNIT/ML injection, USE IN INSULIN PUMP AS  DIRECTED 40 UNITS PER DAY (Patient taking  differently: USE IN INSULIN PUMP AS  DIRECTED), Disp: 40 mL, Rfl: 1 .  Insulin Degludec 200 UNIT/ML SOPN, Inject into the skin., Disp: , Rfl:  .  misoprostol (CYTOTEC) 200 MCG tablet, Take 4 tablets (800 mcg total) by mouth once for 1 dose., Disp: 4 tablet, Rfl: 1 .  Prenatal Vit-Fe Fumarate-FA (PRENATAL MULTIVITAMIN) TABS tablet, Take 1 tablet by mouth daily at 12 noon., Disp: , Rfl:   Objective Blood pressure 116/81, pulse (!) 101, weight 124 lb (56.2 kg), last menstrual period 10/06/2018, unknown if currently breastfeeding.  See sonogram  Pertinent ROS No burning with urination, frequency or urgency No nausea, vomiting or diarrhea Nor fever chills or other constitutional symptoms   Labs or studies       Impression Diagnoses this Encounter::   ICD-10-CM   1. Missed abortion O02.1     Established relevant diagnosis(es):   Plan/Recommendations: Meds ordered this encounter  Medications  . misoprostol (CYTOTEC) 200 MCG tablet    Sig: Take 4 tablets (800 mcg total) by mouth once for 1 dose.    Dispense:  4 tablet    Refill:  1    Labs or Scans Ordered: No orders of the defined types were placed in this encounter.   Management:: >cytotec 800 micrograms orally, repeat 2-3 days later if needed >follow up 2 weeks after pregnancy loss  Follow up Return if symptoms worsen or fail to improve.        All questions were answered.

## 2019-08-07 ENCOUNTER — Telehealth: Payer: Self-pay | Admitting: Obstetrics & Gynecology

## 2019-08-07 NOTE — Telephone Encounter (Signed)

## 2019-08-08 ENCOUNTER — Encounter: Payer: Self-pay | Admitting: *Deleted

## 2019-08-08 ENCOUNTER — Other Ambulatory Visit: Payer: Self-pay

## 2019-08-08 ENCOUNTER — Ambulatory Visit (INDEPENDENT_AMBULATORY_CARE_PROVIDER_SITE_OTHER): Payer: Medicaid Other | Admitting: *Deleted

## 2019-08-08 VITALS — BP 116/79 | HR 104 | Ht 65.0 in | Wt 126.0 lb

## 2019-08-08 DIAGNOSIS — Z3201 Encounter for pregnancy test, result positive: Secondary | ICD-10-CM

## 2019-08-08 LAB — POCT URINE PREGNANCY: Preg Test, Ur: POSITIVE — AB

## 2019-08-08 NOTE — Progress Notes (Signed)
Chart reviewed for nurse visit. Agree with plan of care.  Estill Dooms, NP 08/08/2019 12:19 PM

## 2019-08-08 NOTE — Progress Notes (Signed)
   NURSE VISIT- PREGNANCY CONFIRMATION   SUBJECTIVE:  [name] is a 28 y.o. [phone] female at Unknown by uncertain LMP of No LMP recorded (lmp unknown). Patient is pregnant. Here for pregnancy confirmation.  Home pregnancy test: positive x 3  She reports bleeding.  She is not taking prenatal vitamins.    OBJECTIVE:  BP 116/79 (BP Location: Left Arm, Patient Position: Sitting, Cuff Size: Normal)   Pulse (!) 104   Ht 5\' 5"  (1.651 m)   Wt 126 lb (57.2 kg)   LMP  (LMP Unknown)   BMI 20.97 kg/m   Appears well, in no apparent distress OB History  Gravida Para Term Preterm AB Living  5 2 0 2 2 2   SAB TAB Ectopic Multiple Live Births  1 0 1 0 2    # Outcome Date GA Lbr Len/2nd Weight Sex Delivery Anes PTL Lv  5 Current           4 SAB 12/29/18 [redacted]w[redacted]d         3 Preterm 09/08/13 [redacted]w[redacted]d 03:12 / 00:16 9 lb 1 oz (4.111 kg) M Vag-Spont None Y LIV     Birth Comments: Hx. coarctation of aorta, absent left kidney; had an infection at birth and problems breathing due to a problem with his diaphragm, per mother.  NICU x 5 weeks; was on face mask O2, per mother; no vent.     Complications: Diabetes mellitus  2 Preterm 08/25/12 [redacted]w[redacted]d 55:00 / 03:19 7 lb 2.5 oz (3.245 kg) F Vag-Spont EPI Y LIV     Birth Comments: NICU x1 wk, no vent     Complications: Diabetes mellitus, Shoulder dystocia, delivered  1 Ectopic 10/2011             Birth Comments: [facility]TX @ [facility]    Results for orders placed or performed in visit on 08/08/19 (from the past 24 hour(s))  POCT urine pregnancy   Collection Time: 08/08/19 10:57 AM  Result Value Ref Range   Preg Test, Ur Positive (A) Negative    ASSESSMENT: Positive pregnancy test, Unknown by LMP    PLAN: Schedule for dating ultrasound in 2 weeks Prenatal vitamins: plans to begin OTC ASAP   Nausea medicines: not currently needed   OB packet given: Yes  [name]  08/08/2019 10:57 AM

## 2019-08-21 ENCOUNTER — Telehealth: Payer: Self-pay | Admitting: Obstetrics & Gynecology

## 2019-08-21 ENCOUNTER — Other Ambulatory Visit: Payer: Self-pay | Admitting: Obstetrics & Gynecology

## 2019-08-21 DIAGNOSIS — O3680X0 Pregnancy with inconclusive fetal viability, not applicable or unspecified: Secondary | ICD-10-CM

## 2019-08-21 NOTE — Telephone Encounter (Signed)

## 2019-08-22 ENCOUNTER — Ambulatory Visit (INDEPENDENT_AMBULATORY_CARE_PROVIDER_SITE_OTHER): Payer: Medicaid Other

## 2019-08-22 ENCOUNTER — Ambulatory Visit (INDEPENDENT_AMBULATORY_CARE_PROVIDER_SITE_OTHER): Payer: Medicaid Other | Admitting: Adult Health

## 2019-08-22 ENCOUNTER — Other Ambulatory Visit: Payer: Self-pay

## 2019-08-22 ENCOUNTER — Encounter: Payer: Self-pay | Admitting: Adult Health

## 2019-08-22 ENCOUNTER — Other Ambulatory Visit: Payer: Self-pay | Admitting: Obstetrics & Gynecology

## 2019-08-22 VITALS — BP 114/80 | HR 92 | Ht 65.0 in | Wt 126.0 lb

## 2019-08-22 DIAGNOSIS — O039 Complete or unspecified spontaneous abortion without complication: Secondary | ICD-10-CM | POA: Diagnosis not present

## 2019-08-22 DIAGNOSIS — O3680X0 Pregnancy with inconclusive fetal viability, not applicable or unspecified: Secondary | ICD-10-CM

## 2019-08-22 DIAGNOSIS — E1065 Type 1 diabetes mellitus with hyperglycemia: Secondary | ICD-10-CM | POA: Diagnosis not present

## 2019-08-22 DIAGNOSIS — O209 Hemorrhage in early pregnancy, unspecified: Secondary | ICD-10-CM

## 2019-08-22 DIAGNOSIS — Z3A01 Less than 8 weeks gestation of pregnancy: Secondary | ICD-10-CM | POA: Diagnosis not present

## 2019-08-22 NOTE — Progress Notes (Signed)
[place] TA/TV: 6+5 wks single IUP,no FHT,CRL 8.08 mm,GS is located in the lower uterine segment,GS 23.8 mm,[name] discussed results with pt.

## 2019-08-22 NOTE — Patient Instructions (Signed)
Miscarriage °A miscarriage is the loss of an unborn baby (fetus) before the 20th week of pregnancy. Most miscarriages happen during the first 3 months of pregnancy. Sometimes, a miscarriage can happen before a woman knows that she is pregnant. °Having a miscarriage can be an emotional experience. If you have had a miscarriage, talk with your health care provider about any questions you may have about miscarrying, the grieving process, and your plans for future pregnancy. °What are the causes? °A miscarriage may be caused by: °· Problems with the genes or chromosomes of the fetus. These problems make it impossible for the baby to develop normally. They are often the result of random errors that occur early in the development of the baby, and are not passed from parent to child (not inherited). °· Infection of the cervix or uterus. °· Conditions that affect hormone balance in the body. °· Problems with the cervix, such as the cervix opening and thinning before pregnancy is at term (cervical insufficiency). °· Problems with the uterus. These may include: °? A uterus with an abnormal shape. °? Fibroids in the uterus. °? Congenital abnormalities. These are problems that were present at birth. °· Certain medical conditions. °· Smoking, drinking alcohol, or using drugs. °· Injury (trauma). °In many cases, the cause of a miscarriage is not known. °What are the signs or symptoms? °Symptoms of this condition include: °· Vaginal bleeding or spotting, with or without cramps or pain. °· Pain or cramping in the abdomen or lower back. °· Passing fluid, tissue, or blood clots from the vagina. °How is this diagnosed? °This condition may be diagnosed based on: °· A physical exam. °· Ultrasound. °· Blood tests. °· Urine tests. °How is this treated? °Treatment for a miscarriage is sometimes not necessary if you naturally pass all the tissue that was in your uterus. If necessary, this condition may be treated with: °· Dilation and  curettage (D&C). This is a procedure in which the cervix is stretched open and the lining of the uterus (endometrium) is scraped. This is done only if tissue from the fetus or placenta remains in the body (incomplete miscarriage). °· Medicines, such as: °? Antibiotic medicine, to treat infection. °? Medicine to help the body pass any remaining tissue. °? Medicine to reduce (contract) the size of the uterus. These medicines may be given if you have a lot of bleeding. °If you have Rh negative blood and your baby was Rh positive, you will need a shot of a medicine called Rh immunoglobulinto protect your future babies from Rh blood problems. "Rh-negative" and "Rh-positive" refer to whether or not the blood has a specific protein found on the surface of red blood cells (Rh factor). °Follow these instructions at home: °Medicines ° °· Take over-the-counter and prescription medicines only as told by your health care provider. °· If you were prescribed antibiotic medicine, take it as told by your health care provider. Do not stop taking the antibiotic even if you start to feel better. °· Do not take NSAIDs, such as aspirin and ibuprofen, unless they are approved by your health care provider. These medicines can cause bleeding. °Activity °· Rest as directed. Ask your health care provider what activities are safe for you. °· Have someone help with home and family responsibilities during this time. °General instructions °· Keep track of the number of sanitary pads you use each day and how soaked (saturated) they are. Write down this information. °· Monitor the amount of tissue or blood clots that   you pass from your vagina. Save any large amounts of tissue for your health care provider to examine. °· Do not use tampons, douche, or have sex until your health care provider approves. °· To help you and your partner with the process of grieving, talk with your health care provider or seek counseling. °· When you are ready, meet with  your health care provider to discuss any important steps you should take for your health. Also, discuss steps you should take to have a healthy pregnancy in the future. °· Keep all follow-up visits as told by your health care provider. This is important. °Where to find more information °· The American Congress of Obstetricians and Gynecologists: www.acog.org °· U.S. Department of Health and Human Services Office of Women’s Health: www.womenshealth.gov °Contact a health care provider if: °· You have a fever or chills. °· You have a foul smelling vaginal discharge. °· You have more bleeding instead of less. °Get help right away if: °· You have severe cramps or pain in your back or abdomen. °· You pass blood clots or tissue from your vagina that is walnut-sized or larger. °· You soak more than 1 regular sanitary pad in an hour. °· You become light-headed or weak. °· You pass out. °· You have feelings of sadness that take over your thoughts, or you have thoughts of hurting yourself. °Summary °· Most miscarriages happen in the first 3 months of pregnancy. Sometimes miscarriage happens before a woman even knows that she is pregnant. °· Follow your health care provider's instruction for home care. Keep all follow-up appointments. °· To help you and your partner with the process of grieving, talk with your health care provider or seek counseling. °This information is not intended to replace advice given to you by your health care provider. Make sure you discuss any questions you have with your health care provider. °Document Released: 03/16/2001 Document Revised: 01/12/2019 Document Reviewed: 10/26/2016 °Elsevier Patient Education © 2020 Elsevier Inc. ° °

## 2019-08-22 NOTE — Progress Notes (Signed)
  Subjective:     Patient ID: [name], female   DOB: 12-21-1990, 28 y.o.   MRN: [id number]  HPI [name] is a 28 year old white female, separated, [id number], in for dating [place].She has been bleeding for 2 weeks and was seen at [facility] and was admiited for DKA,she has no idea what her A1c is. She had miscarriage in March.  PCP Is Dr Sherrie Sport, who she sees next week  Review of Systems Bleeding for 2 weeks, was seen at [facility] Was in hospital for DKA in [place] past medical,surgical, social and family history. Reviewed medications and allergies.     Objective:   Physical Exam BP 114/80 (BP Location: Left Arm, Patient Position: Sitting, Cuff Size: Normal)   Pulse 92   Ht 5\' 5"  (1.651 m)   Wt 126 lb (57.2 kg)   LMP  (LMP Unknown)   Breastfeeding No   BMI 20.97 kg/m   Blood type is O+ in CHL. US showed no FHT, GS in lower uterine segment and CRL is 8.08 mm or about 6+5 weeks.    Assessment:     1. Miscarriage   2. Uncontrolled type 1 diabetes mellitus with hyperglycemia ([place])       Plan:     Discussed following miscarriage or using Cytotec and she wants to follow for now Also discussed that she needs to have blood sugars under control before getting pregnant again Follow up in 2 weeks

## 2019-09-05 ENCOUNTER — Telehealth: Payer: Self-pay | Admitting: Adult Health

## 2019-09-05 NOTE — Telephone Encounter (Signed)

## 2019-09-06 ENCOUNTER — Ambulatory Visit: Payer: Medicaid Other | Admitting: Adult Health

## 2019-09-07 ENCOUNTER — Telehealth: Payer: Self-pay | Admitting: Adult Health

## 2019-09-07 NOTE — Telephone Encounter (Signed)

## 2019-09-10 ENCOUNTER — Other Ambulatory Visit: Payer: Self-pay

## 2019-09-10 ENCOUNTER — Encounter: Payer: Self-pay | Admitting: Adult Health

## 2019-09-10 ENCOUNTER — Ambulatory Visit (INDEPENDENT_AMBULATORY_CARE_PROVIDER_SITE_OTHER): Payer: Medicaid Other | Admitting: Adult Health

## 2019-09-10 VITALS — BP 103/77 | HR 97 | Ht 65.0 in | Wt 129.5 lb

## 2019-09-10 DIAGNOSIS — E1065 Type 1 diabetes mellitus with hyperglycemia: Secondary | ICD-10-CM | POA: Diagnosis not present

## 2019-09-10 DIAGNOSIS — O039 Complete or unspecified spontaneous abortion without complication: Secondary | ICD-10-CM

## 2019-09-10 DIAGNOSIS — Z3202 Encounter for pregnancy test, result negative: Secondary | ICD-10-CM | POA: Diagnosis not present

## 2019-09-10 LAB — POCT URINE PREGNANCY: Preg Test, Ur: NEGATIVE

## 2019-09-10 NOTE — Progress Notes (Signed)
  Subjective:     Patient ID: [name], female   DOB: [birth year], 28 y.o.   MRN: [id number]  HPI [name] is a 28 year old white female, [id number] in for follow up after miscarriage, she said she passed tissue 2 days after last visit, which was 08/22/19.  PCP is [facility] in [place].   Review of Systems No pain, no bleeding Reviewed past medical,surgical, social and family history. Reviewed medications and allergies.     Objective:   Physical Exam BP 103/77 (BP Location: Left Arm, Patient Position: Sitting, Cuff Size: Normal)   Pulse 97   Ht 5\' 5"  (1.651 m)   Wt 129 lb 8 oz (58.7 kg)   LMP  (LMP Unknown)   Breastfeeding No   BMI 21.55 kg/m UPT negative Skin warm and dry.Lungs: clear to ausculation bilaterally. Cardiovascular: regular rate and rhythm. Fall risk is low Blood type is O+ in CHL.     Assessment:     1. Miscarriage   2. Pregnancy examination or test, negative result   3. Uncontrolled type 1 diabetes mellitus with hyperglycemia ([place])       Plan:     Check QHCG today Use condoms Get blood sugars under control before trying to get pregnant again   Follow up prn

## 2019-09-11 ENCOUNTER — Telehealth: Payer: Self-pay | Admitting: *Deleted

## 2019-09-11 LAB — BETA HCG QUANT (REF LAB): hCG Quant: 2 m[IU]/mL

## 2019-09-11 NOTE — Telephone Encounter (Signed)
Pt aware quant is 2 so miscarriage is complete, no need for any more labs. Pt voiced understanding. [place]

## 2019-09-11 NOTE — Telephone Encounter (Signed)
-----   Message from Estill Dooms, NP sent at 09/11/2019  8:11 AM EST ----- Let pt know QHCG is 2, so miscarriage complete, does not need any more labs

## 2020-12-25 ENCOUNTER — Ambulatory Visit: Payer: Commercial Managed Care - PPO | Admitting: Nurse Practitioner

## 2021-02-25 ENCOUNTER — Telehealth: Payer: Self-pay

## 2021-02-25 ENCOUNTER — Ambulatory Visit: Payer: Medicaid Other | Admitting: Nurse Practitioner

## 2021-02-25 NOTE — Telephone Encounter (Signed)
Per Ronny Bacon, NP - do not reschedule due to missing 2 new patient appointments. Notified patient's OBGYN that referred her here via fax.
